# Patient Record
Sex: Female | Born: 1990 | Race: White | Hispanic: No | Marital: Single | Smoking: Never smoker
Health system: Southern US, Community
[De-identification: ages and names within clinical notes are randomized; demographics above are authoritative.]

## PROBLEM LIST (undated history)

## (undated) ENCOUNTER — Inpatient Hospital Stay (HOSPITAL_COMMUNITY): Payer: Self-pay

## (undated) DIAGNOSIS — F32A Depression, unspecified: Secondary | ICD-10-CM

## (undated) DIAGNOSIS — R87629 Unspecified abnormal cytological findings in specimens from vagina: Secondary | ICD-10-CM

## (undated) DIAGNOSIS — K5909 Other constipation: Secondary | ICD-10-CM

## (undated) DIAGNOSIS — M797 Fibromyalgia: Secondary | ICD-10-CM

## (undated) DIAGNOSIS — F1911 Other psychoactive substance abuse, in remission: Secondary | ICD-10-CM

## (undated) DIAGNOSIS — Z8619 Personal history of other infectious and parasitic diseases: Secondary | ICD-10-CM

## (undated) DIAGNOSIS — F319 Bipolar disorder, unspecified: Secondary | ICD-10-CM

## (undated) DIAGNOSIS — F419 Anxiety disorder, unspecified: Secondary | ICD-10-CM

## (undated) DIAGNOSIS — F329 Major depressive disorder, single episode, unspecified: Secondary | ICD-10-CM

## (undated) HISTORY — PX: NO PAST SURGERIES: SHX2092

## (undated) HISTORY — PX: TONSILLECTOMY: SUR1361

## (undated) HISTORY — PX: BREAST ENHANCEMENT SURGERY: SHX7

## (undated) HISTORY — DX: Unspecified abnormal cytological findings in specimens from vagina: R87.629

---
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
## 2010-06-08 ENCOUNTER — Ambulatory Visit (HOSPITAL_COMMUNITY)
Admission: RE | Admit: 2010-06-08 | Discharge: 2010-06-08 | Disposition: A | Discharge status: Home / Self Care | Payer: BC Managed Care – PPO | Source: Ambulatory Visit | Attending: Psychiatry | Admitting: Psychiatry

## 2010-06-08 ENCOUNTER — Other Ambulatory Visit (HOSPITAL_COMMUNITY): Payer: BC Managed Care – PPO | Admitting: Psychiatry

## 2010-06-08 DIAGNOSIS — F3289 Other specified depressive episodes: Secondary | ICD-10-CM | POA: Insufficient documentation

## 2010-06-08 DIAGNOSIS — F329 Major depressive disorder, single episode, unspecified: Secondary | ICD-10-CM | POA: Insufficient documentation

## 2010-06-11 ENCOUNTER — Other Ambulatory Visit (HOSPITAL_COMMUNITY): Payer: Self-pay

## 2010-06-12 ENCOUNTER — Other Ambulatory Visit (HOSPITAL_COMMUNITY): Payer: BC Managed Care – PPO | Attending: Psychiatry | Admitting: Psychiatry

## 2010-06-12 DIAGNOSIS — J45909 Unspecified asthma, uncomplicated: Secondary | ICD-10-CM | POA: Insufficient documentation

## 2010-06-12 DIAGNOSIS — F329 Major depressive disorder, single episode, unspecified: Secondary | ICD-10-CM | POA: Insufficient documentation

## 2010-06-12 DIAGNOSIS — F3289 Other specified depressive episodes: Secondary | ICD-10-CM | POA: Insufficient documentation

## 2010-06-13 ENCOUNTER — Other Ambulatory Visit (HOSPITAL_COMMUNITY): Payer: Self-pay

## 2010-06-13 ENCOUNTER — Other Ambulatory Visit (HOSPITAL_COMMUNITY): Payer: BC Managed Care – PPO | Attending: Psychiatry | Admitting: Psychiatry

## 2010-06-13 ENCOUNTER — Other Ambulatory Visit (HOSPITAL_COMMUNITY): Payer: Self-pay | Admitting: Psychiatry

## 2010-06-14 LAB — DRUGS OF ABUSE SCREEN W/O ALC, ROUTINE URINE
Cocaine Metabolites: NEGATIVE
Creatinine,U: 263.1 mg/dL
Marijuana Metabolite: NEGATIVE
Opiate Screen, Urine: NEGATIVE
Propoxyphene: NEGATIVE

## 2010-06-15 ENCOUNTER — Other Ambulatory Visit (HOSPITAL_COMMUNITY): Payer: BC Managed Care – PPO | Attending: Psychiatry | Admitting: Psychiatry

## 2010-06-15 ENCOUNTER — Other Ambulatory Visit (HOSPITAL_COMMUNITY): Payer: Self-pay

## 2010-06-15 DIAGNOSIS — F411 Generalized anxiety disorder: Secondary | ICD-10-CM | POA: Insufficient documentation

## 2010-06-15 DIAGNOSIS — J45909 Unspecified asthma, uncomplicated: Secondary | ICD-10-CM | POA: Insufficient documentation

## 2010-06-15 DIAGNOSIS — F332 Major depressive disorder, recurrent severe without psychotic features: Secondary | ICD-10-CM | POA: Insufficient documentation

## 2010-06-15 DIAGNOSIS — F1910 Other psychoactive substance abuse, uncomplicated: Secondary | ICD-10-CM | POA: Insufficient documentation

## 2010-06-15 DIAGNOSIS — F1994 Other psychoactive substance use, unspecified with psychoactive substance-induced mood disorder: Secondary | ICD-10-CM | POA: Insufficient documentation

## 2010-06-15 DIAGNOSIS — F1920 Other psychoactive substance dependence, uncomplicated: Secondary | ICD-10-CM

## 2010-06-18 ENCOUNTER — Ambulatory Visit (HOSPITAL_COMMUNITY)
Admission: RE | Admit: 2010-06-18 | Discharge: 2010-06-18 | Disposition: A | Discharge status: Home / Self Care | Payer: BC Managed Care – PPO | Attending: Psychiatry | Admitting: Psychiatry

## 2010-06-18 ENCOUNTER — Other Ambulatory Visit (HOSPITAL_COMMUNITY): Payer: Self-pay

## 2010-06-18 ENCOUNTER — Other Ambulatory Visit (HOSPITAL_COMMUNITY): Payer: BC Managed Care – PPO | Admitting: Psychiatry

## 2010-06-18 ENCOUNTER — Emergency Department (HOSPITAL_COMMUNITY)
Admission: EM | Admit: 2010-06-18 | Discharge: 2010-06-19 | Disposition: A | Discharge status: Other Acute Inpatient Hospital | Payer: BC Managed Care – PPO | Attending: Emergency Medicine | Admitting: Emergency Medicine

## 2010-06-18 DIAGNOSIS — F329 Major depressive disorder, single episode, unspecified: Secondary | ICD-10-CM | POA: Insufficient documentation

## 2010-06-18 DIAGNOSIS — F3289 Other specified depressive episodes: Secondary | ICD-10-CM | POA: Insufficient documentation

## 2010-06-18 DIAGNOSIS — R45851 Suicidal ideations: Secondary | ICD-10-CM | POA: Insufficient documentation

## 2010-06-18 LAB — DIFFERENTIAL
Basophils Absolute: 0.1 10*3/uL (ref 0.0–0.1)
Basophils Relative: 1 % (ref 0–1)
Lymphocytes Relative: 26 % (ref 12–46)
Monocytes Absolute: 0.6 10*3/uL (ref 0.1–1.0)
Monocytes Relative: 7 % (ref 3–12)
Neutro Abs: 5.9 10*3/uL (ref 1.7–7.7)
Neutrophils Relative %: 63 % (ref 43–77)

## 2010-06-18 LAB — URINALYSIS, ROUTINE W REFLEX MICROSCOPIC
Glucose, UA: NEGATIVE mg/dL
Ketones, ur: NEGATIVE mg/dL
Protein, ur: NEGATIVE mg/dL
Urobilinogen, UA: 0.2 mg/dL (ref 0.0–1.0)

## 2010-06-18 LAB — CBC
HCT: 38.4 % (ref 36.0–46.0)
Hemoglobin: 13.1 g/dL (ref 12.0–15.0)
RBC: 4.35 MIL/uL (ref 3.87–5.11)

## 2010-06-18 LAB — ETHANOL: Alcohol, Ethyl (B): <11 mg/dL — ABNORMAL HIGH (ref 0–10)

## 2010-06-18 LAB — RAPID URINE DRUG SCREEN, HOSP PERFORMED
Amphetamines: NOT DETECTED
Barbiturates: NOT DETECTED
Benzodiazepines: NOT DETECTED

## 2010-06-18 LAB — COMPREHENSIVE METABOLIC PANEL
Alkaline Phosphatase: 64 U/L (ref 39–117)
BUN: 14 mg/dL (ref 6–23)
Calcium: 9.7 mg/dL (ref 8.4–10.5)
Creatinine, Ser: 0.75 mg/dL (ref 0.4–1.2)
Glucose, Bld: 78 mg/dL (ref 70–99)
Total Protein: 8.3 g/dL (ref 6.0–8.3)

## 2010-06-19 ENCOUNTER — Inpatient Hospital Stay (HOSPITAL_COMMUNITY)
Admission: AD | Admit: 2010-06-19 | Discharge: 2010-06-22 | DRG: 430 | Disposition: A | Discharge status: Home / Self Care | Payer: BC Managed Care – PPO | Source: Ambulatory Visit | Attending: Psychiatry | Admitting: Psychiatry

## 2010-06-19 DIAGNOSIS — R45851 Suicidal ideations: Secondary | ICD-10-CM

## 2010-06-19 DIAGNOSIS — J45909 Unspecified asthma, uncomplicated: Secondary | ICD-10-CM

## 2010-06-19 DIAGNOSIS — Z560 Unemployment, unspecified: Secondary | ICD-10-CM

## 2010-06-19 DIAGNOSIS — F1911 Other psychoactive substance abuse, in remission: Secondary | ICD-10-CM

## 2010-06-19 DIAGNOSIS — F332 Major depressive disorder, recurrent severe without psychotic features: Principal | ICD-10-CM

## 2010-06-19 DIAGNOSIS — IMO0002 Reserved for concepts with insufficient information to code with codable children: Secondary | ICD-10-CM

## 2010-06-19 DIAGNOSIS — Z880 Allergy status to penicillin: Secondary | ICD-10-CM

## 2010-06-20 ENCOUNTER — Other Ambulatory Visit (HOSPITAL_COMMUNITY): Payer: Self-pay

## 2010-06-20 ENCOUNTER — Other Ambulatory Visit (HOSPITAL_COMMUNITY): Payer: BC Managed Care – PPO | Admitting: Psychiatry

## 2010-06-20 DIAGNOSIS — F339 Major depressive disorder, recurrent, unspecified: Secondary | ICD-10-CM

## 2010-06-20 DIAGNOSIS — F1921 Other psychoactive substance dependence, in remission: Secondary | ICD-10-CM
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
## 2010-06-22 ENCOUNTER — Other Ambulatory Visit (HOSPITAL_COMMUNITY): Payer: BC Managed Care – PPO | Admitting: Psychiatry

## 2010-06-22 ENCOUNTER — Other Ambulatory Visit (HOSPITAL_COMMUNITY): Payer: Self-pay

## 2010-06-25 ENCOUNTER — Other Ambulatory Visit (HOSPITAL_COMMUNITY): Payer: Self-pay

## 2010-06-25 ENCOUNTER — Other Ambulatory Visit (HOSPITAL_COMMUNITY): Payer: BC Managed Care – PPO | Admitting: Psychiatry

## 2010-06-25 NOTE — Discharge Summary (Signed)
Alejandra George, [name]:  [id number]  MEDICAL RECORD NO.:  [id number]  LOCATION:  0507                          FACILITY:  BH  PHYSICIAN:  Franchot Gallo, MD     DATE OF BIRTH:  October 25, 1990  DATE OF ADMISSION:  06/19/2010 DATE OF DISCHARGE:  06/22/2010                              DISCHARGE SUMMARY   REASON FOR ADMISSION:  The patient states that she does not know who she is or why she is alive.  She reported having longstanding depressant symptoms as well as a history of polysubstance dependence abuse.  She reports a long history of depression since the age of 82.  Having problems initiating and maintaining sleep.  Severe feelings of sadness, anhedonia, depressed mood and crying spells.  She was having some vague suicidal thoughts, but no plan or intent.  FINAL IMPRESSION:  AXIS I:  Major depressive disorder recurrent, severe. Dysthymic disorder, early onset type.  Polysubstance abuse dependence in early remission for 30 days. AXIS II:  Deferred. AXIS III:  History of asthma, seasonal allergies. AXIS IV:  Problems with limited primary support system, unemployment, financial constraints and chronic mental illness. AXIS V:  Highest global assessment of functioning is 55-60.  PERTINENT LABS:  Alcohol level was less than 11.  Urine pregnancy test was negative.  Urinalysis was negative.  Urine drug screen is negative. CBC is within normal limits.  Potassium of 3.2.  The patient did receive 40 mEq of potassium chloride.  SIGNIFICANT FINDINGS:  The patient was admitted to the adult milieu. She was restarted on her Zoloft 50 mg to address her depressive symptoms and Lamictal to provide mood stabilization.  Also had a multivitamin due to a slight reduction in serum potassium and an albumin level of 3.9. We also monitored her for dangerousness to self or others and to identify her support group.  The patient was attending groups.  Her sleep was improving.  Her  appetite was good.  Having mild depressive symptoms rating it as 2 on a scale of 1-10.  Denied any suicidal or homicidal thoughts, or auditory hallucinations.  Having no medication side effects.  The patient was integrated on the unit well, making friends and initially did not want to be discharged.  She was scheduled to go to the [employer] on June 26, 2010 regarding permanent housing.  On day of discharge, the patient's sleep was good, appetite was good, having mild depressive symptoms rating 1 on a scale of 1-10.  Adamantly denying any suicidal or homicidal thoughts or auditory hallucinations.  No substance use in 30 days.  Did not need any medication followup.  The patient will be discharged to outpatient care to follow up with [facility] on Tuesday.  DISCHARGE MEDICATIONS: 1. Vistaril 1 tablet nightly. 2. Lamictal 25 mg daily. 3. Zoloft 50 mg daily. 4. Proventil for shortness of breath.  FOLLOW UP:  Her follow up appointment was at [facility] and [facility] with an  appointment on Thursday, June 28, 2010 at 8 a.m.     Alejandra George, N.P.   ______________________________ Franchot Gallo, MD    JO/MEDQ  D:  06/22/2010  T:  06/22/2010  Job:  [id number]  Electronically Signed by Alejandra George.P. on 06/25/2010 09:22:18 AM Electronically Signed by Franchot Gallo MD on 06/25/2010 05:05:30 PM

## 2010-06-27 ENCOUNTER — Other Ambulatory Visit (HOSPITAL_COMMUNITY): Payer: Self-pay

## 2010-06-27 ENCOUNTER — Other Ambulatory Visit (HOSPITAL_COMMUNITY): Payer: BC Managed Care – PPO | Admitting: Psychiatry

## 2010-06-29 ENCOUNTER — Other Ambulatory Visit (HOSPITAL_COMMUNITY): Payer: Self-pay

## 2010-06-29 ENCOUNTER — Other Ambulatory Visit (HOSPITAL_COMMUNITY): Payer: BC Managed Care – PPO | Admitting: Psychiatry

## 2010-07-02 ENCOUNTER — Other Ambulatory Visit (HOSPITAL_COMMUNITY): Payer: BC Managed Care – PPO | Admitting: Psychiatry

## 2010-07-02 ENCOUNTER — Other Ambulatory Visit (HOSPITAL_COMMUNITY): Payer: Self-pay

## 2010-07-04 ENCOUNTER — Other Ambulatory Visit (HOSPITAL_COMMUNITY): Payer: BC Managed Care – PPO | Admitting: Psychiatry

## 2010-07-04 ENCOUNTER — Other Ambulatory Visit (HOSPITAL_COMMUNITY): Payer: Self-pay

## 2010-07-06 ENCOUNTER — Other Ambulatory Visit (HOSPITAL_COMMUNITY): Payer: Self-pay

## 2010-07-06 ENCOUNTER — Other Ambulatory Visit (HOSPITAL_COMMUNITY): Payer: BC Managed Care – PPO | Admitting: Psychiatry

## 2010-07-09 ENCOUNTER — Other Ambulatory Visit (HOSPITAL_COMMUNITY): Payer: BC Managed Care – PPO | Admitting: Psychiatry

## 2010-07-09 ENCOUNTER — Other Ambulatory Visit (HOSPITAL_COMMUNITY): Payer: Self-pay

## 2010-07-11 ENCOUNTER — Other Ambulatory Visit (HOSPITAL_COMMUNITY): Payer: Self-pay

## 2010-07-11 ENCOUNTER — Other Ambulatory Visit (HOSPITAL_COMMUNITY): Payer: BC Managed Care – PPO | Admitting: Psychiatry

## 2010-07-13 ENCOUNTER — Other Ambulatory Visit (HOSPITAL_COMMUNITY): Payer: Self-pay

## 2010-07-13 ENCOUNTER — Other Ambulatory Visit (HOSPITAL_COMMUNITY): Payer: BC Managed Care – PPO | Admitting: Psychiatry

## 2010-07-16 ENCOUNTER — Other Ambulatory Visit (HOSPITAL_COMMUNITY): Payer: Self-pay

## 2010-07-16 ENCOUNTER — Other Ambulatory Visit (HOSPITAL_COMMUNITY): Payer: BC Managed Care – PPO | Admitting: Psychiatry

## 2010-07-20 ENCOUNTER — Other Ambulatory Visit (HOSPITAL_COMMUNITY): Payer: Self-pay

## 2010-07-20 ENCOUNTER — Other Ambulatory Visit (HOSPITAL_COMMUNITY): Payer: BC Managed Care – PPO | Admitting: Psychiatry

## 2010-07-23 ENCOUNTER — Other Ambulatory Visit (HOSPITAL_COMMUNITY): Payer: BC Managed Care – PPO | Admitting: Psychiatry

## 2010-07-23 ENCOUNTER — Other Ambulatory Visit (HOSPITAL_COMMUNITY): Payer: Self-pay

## 2010-07-25 ENCOUNTER — Other Ambulatory Visit (HOSPITAL_COMMUNITY): Payer: BC Managed Care – PPO | Admitting: Psychiatry

## 2010-07-25 ENCOUNTER — Other Ambulatory Visit (HOSPITAL_COMMUNITY): Payer: Self-pay

## 2010-07-27 ENCOUNTER — Other Ambulatory Visit (HOSPITAL_COMMUNITY): Payer: BC Managed Care – PPO | Admitting: Psychiatry

## 2010-07-27 ENCOUNTER — Other Ambulatory Visit (HOSPITAL_COMMUNITY): Payer: Self-pay

## 2010-07-30 ENCOUNTER — Other Ambulatory Visit (HOSPITAL_COMMUNITY): Payer: BC Managed Care – PPO | Admitting: Psychiatry

## 2010-07-30 ENCOUNTER — Other Ambulatory Visit (HOSPITAL_COMMUNITY): Payer: Self-pay

## 2010-08-01 ENCOUNTER — Other Ambulatory Visit (HOSPITAL_COMMUNITY): Payer: BC Managed Care – PPO | Admitting: Psychiatry

## 2010-08-01 ENCOUNTER — Other Ambulatory Visit (HOSPITAL_COMMUNITY): Payer: Self-pay

## 2010-08-03 ENCOUNTER — Other Ambulatory Visit (HOSPITAL_COMMUNITY): Payer: Self-pay

## 2010-12-03 LAB — OB RESULTS CONSOLE ABO/RH: RH Type: POSITIVE

## 2010-12-03 LAB — OB RESULTS CONSOLE HIV ANTIBODY (ROUTINE TESTING): HIV: NONREACTIVE

## 2010-12-03 LAB — OB RESULTS CONSOLE HEPATITIS B SURFACE ANTIGEN: Hepatitis B Surface Ag: NEGATIVE

## 2010-12-03 LAB — OB RESULTS CONSOLE ANTIBODY SCREEN: Antibody Screen: NEGATIVE

## 2010-12-03 LAB — OB RESULTS CONSOLE RPR: RPR: NONREACTIVE

## 2011-01-11 LAB — OB RESULTS CONSOLE GC/CHLAMYDIA: Gonorrhea: NEGATIVE

## 2011-02-04 ENCOUNTER — Ambulatory Visit (HOSPITAL_COMMUNITY): Payer: Self-pay | Admitting: Psychiatry

## 2011-03-02 ENCOUNTER — Emergency Department (HOSPITAL_COMMUNITY)
Admission: EM | Admit: 2011-03-02 | Discharge: 2011-03-02 | Disposition: A | Discharge status: Home / Self Care | Payer: BC Managed Care – PPO | Attending: Emergency Medicine | Admitting: Emergency Medicine

## 2011-03-02 ENCOUNTER — Encounter (HOSPITAL_COMMUNITY): Payer: Self-pay | Admitting: *Deleted

## 2011-03-02 DIAGNOSIS — Z3400 Encounter for supervision of normal first pregnancy, unspecified trimester: Secondary | ICD-10-CM

## 2011-03-02 LAB — URINALYSIS, ROUTINE W REFLEX MICROSCOPIC
Bilirubin Urine: NEGATIVE
Glucose, UA: NEGATIVE mg/dL
Hgb urine dipstick: NEGATIVE
Ketones, ur: NEGATIVE mg/dL
Leukocytes, UA: NEGATIVE
Nitrite: NEGATIVE
Protein, ur: NEGATIVE mg/dL
Specific Gravity, Urine: 1.016 (ref 1.005–1.030)
Urobilinogen, UA: 0.2 mg/dL (ref 0.0–1.0)
pH: 6 (ref 5.0–8.0)

## 2011-03-02 NOTE — ED Notes (Signed)
Pt states she began having lower abdominal pressure 3 days ago that comes and goes.  It isn't painful but describes it as "uncomfortable".  Talked to her doctor and was told to take tylenol, rest, and to come into the ER if it doesn't go away.  Did not take tylenol.

## 2011-03-02 NOTE — ED Provider Notes (Signed)
History     CSN: [id number]  Arrival date & time 03/02/11  1728   First MD Initiated Contact with Patient 03/02/11 1752      No chief complaint on file.   (Consider location/radiation/quality/duration/timing/severity/associated sxs/prior treatment) HPI Comments: G1P0, [redacted] weeks gestation,  reports she has had 3 days of intermittent lower abdominal pressure.  States the pressure or tightness is intermittent and lasts less than a minute, is not painful.  States she had a "light streak of blood" with some mucus when she wiped 3 days ago, has not had any further bleeding.  She has had 5 episodes of pressure today.  States her urine is darker than normal but otherwise she is having no dysuria, frequency, or urgency.  Denies fever, vomiting.  Obstetrician is Dr Valetta Fuller, [facility].  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  States the pregnancy thus far has been uneventful.  Pt has no medical problems.   The history is provided by the patient.    Past Medical History  Diagnosis Date  . Asthma     History reviewed. No pertinent past surgical history.  History reviewed. No pertinent family history.  History  Substance Use Topics  . Smoking status: Not on file  . Smokeless tobacco: Not on file  . Alcohol Use:     OB History    Grav Para Term Preterm Abortions TAB SAB Ect Mult Living   1               Review of Systems  All other systems reviewed and are negative.    Allergies  Penicillins  Home Medications   Current Outpatient Rx  Name Route Sig Dispense Refill  . PRENATAL 27-0.8 MG PO TABS Oral Take 1 tablet by mouth daily.    . SERTRALINE HCL 25 MG PO TABS Oral Take 25 mg by mouth daily.      BP 131/71  Pulse 82  Temp(Src) 98.8 F (37.1 C) (Oral)  Resp 20  SpO2 100%  Physical Exam  Nursing note and vitals reviewed. Constitutional: She is oriented to person, place, and time. She  appears well-developed and well-nourished. No distress.  HENT:  Head: Normocephalic and atraumatic.  Neck: Neck supple.  Cardiovascular: Normal rate and regular rhythm.   Pulmonary/Chest: Effort normal and breath sounds normal.  Abdominal: Soft. There is no tenderness. There is no rebound and no guarding.       Gravid.   Genitourinary: Vagina normal. Uterus is enlarged. Cervix exhibits no motion tenderness.       Cervix is closed.  Uterus is gravid to level of umbilicus.   Neurological: She is alert and oriented to person, place, and time.    ED Course  Procedures (including critical care time)  Labs Reviewed - No data to display No results found.  Ob nurse is in the room, states fetal HR 140s-150s.  6:30 PM Discussed patient with Dr Manus Gunning.  Plan is for sterile bimanual examination only to check os.  Per our discussion, no need for speculum exam at this time.    7:29 PM Sterile bimanual exam performed.  Cervical os is closed.  Patient is comfortable.  Per OB nurse, patient was monitored for 30 minutes per protocol - patient had no contractions.  Fetal heart tones are normal.    1. Normal pregnancy, first       MDM  G1P0  at [redacted] weeks gestation today dated by ultrasound, [facility] July 6, reports localized pressure and tightening around her lower abdomen over the past 3 days.   The episodes did not register on the monitor as contractions, fetal heart tones are normal, cervical os is closed.  Patient denies any discomfort with these sensations.  Patient d/c home to follow up with obstetrician, recommendations for increased fluid intake, less time on her feet at work.  Pt to go to [facility] for worsening condition or any new concerns.  Patient verbalizes understanding and agrees with plan.          Rise Patience, [place] 03/02/11 2001

## 2011-03-02 NOTE — Progress Notes (Signed)
Ok to use only Doppler for FHT per PA

## 2011-03-02 NOTE — Discharge Instructions (Signed)
Please call your obstetrician for a close follow up appointment.  Drink plenty of fluids over the next few days and stay off your feet as much as possible.  If you continue to have pain or you have any more bleeding, please call your doctor and go to the ER at [facility] (also called the MAU). You may return to the ER at any time for worsening condition or any new symptoms that concern you.    Pregnancy - Second Trimester The second trimester of pregnancy (3 to 6 months) is a period of rapid growth for you and your baby. At the end of the sixth month, your baby is about 9 inches long and weighs 1 1/2 pounds. You will begin to feel the baby move between 18 and 20 weeks of the pregnancy. This is called quickening. Weight gain is faster. A clear fluid (colostrum) may leak out of your breasts. You may feel small contractions of the womb (uterus). This is known as false labor or Braxton-Hicks contractions. This is like a practice for labor when the baby is ready to be born. Usually, the problems with morning sickness have usually passed by the end of your first trimester. Some women develop small dark blotches (called cholasma, mask of pregnancy) on their face that usually goes away after the baby is born. Exposure to the sun makes the blotches worse. Acne may also develop in some pregnant women and pregnant women who have acne, may find that it goes away. PRENATAL EXAMS  Blood work may continue to be done during prenatal exams. These tests are done to check on your health and the probable health of your baby. Blood work is used to follow your blood levels (hemoglobin). Anemia (low hemoglobin) is common during pregnancy. Iron and vitamins are given to help prevent this. You will also be checked for diabetes between 24 and 28 weeks of the pregnancy. Some of the previous blood tests may be repeated.   The size of the uterus is measured during each visit. This is to make sure that the baby is continuing to  grow properly according to the dates of the pregnancy.   Your blood pressure is checked every prenatal visit. This is to make sure you are not getting toxemia.   Your urine is checked to make sure you do not have an infection, diabetes or protein in the urine.   Your weight is checked often to make sure gains are happening at the suggested rate. This is to ensure that both you and your baby are growing normally.   Sometimes, an ultrasound is performed to confirm the proper growth and development of the baby. This is a test which bounces harmless sound waves off the baby so your caregiver can more accurately determine due dates.  Sometimes, a specialized test is done on the amniotic fluid surrounding the baby. This test is called an amniocentesis. The amniotic fluid is obtained by sticking a needle into the belly (abdomen). This is done to check the chromosomes in instances where there is a concern about possible genetic problems with the baby. It is also sometimes done near the end of pregnancy if an early delivery is required. In this case, it is done to help make sure the baby's lungs are mature enough for the baby to live outside of the womb. CHANGES OCCURING IN THE SECOND TRIMESTER OF PREGNANCY Your body goes through many changes during pregnancy. They vary from person to person. Talk to your caregiver about  changes you notice that you are concerned about.  During the second trimester, you will likely have an increase in your appetite. It is normal to have cravings for certain foods. This varies from person to person and pregnancy to pregnancy.   Your lower abdomen will begin to bulge.   You may have to urinate more often because the uterus and baby are pressing on your bladder. It is also common to get more bladder infections during pregnancy (pain with urination). You can help this by drinking lots of fluids and emptying your bladder before and after intercourse.   You may begin to get  stretch marks on your hips, abdomen, and breasts. These are normal changes in the body during pregnancy. There are no exercises or medications to take that prevent this change.   You may begin to develop swollen and bulging veins (varicose veins) in your legs. Wearing support hose, elevating your feet for 15 minutes, 3 to 4 times a day and limiting salt in your diet helps lessen the problem.   Heartburn may develop as the uterus grows and pushes up against the stomach. Antacids recommended by your caregiver helps with this problem. Also, eating smaller meals 4 to 5 times a day helps.   Constipation can be treated with a stool softener or adding bulk to your diet. Drinking lots of fluids, vegetables, fruits, and whole grains are helpful.   Exercising is also helpful. If you have been very active up until your pregnancy, most of these activities can be continued during your pregnancy. If you have been less active, it is helpful to start an exercise program such as walking.   Hemorrhoids (varicose veins in the rectum) may develop at the end of the second trimester. Warm sitz baths and hemorrhoid cream recommended by your caregiver helps hemorrhoid problems.   Backaches may develop during this time of your pregnancy. Avoid heavy lifting, wear low heal shoes and practice good posture to help with backache problems.   Some pregnant women develop tingling and numbness of their hand and fingers because of swelling and tightening of ligaments in the wrist (carpel tunnel syndrome). This goes away after the baby is born.   As your breasts enlarge, you may have to get a bigger bra. Get a comfortable, cotton, support bra. Do not get a nursing bra until the last month of the pregnancy if you will be nursing the baby.   You may get a dark line from your belly button to the pubic area called the linea nigra.   You may develop rosy cheeks because of increase blood flow to the face.   You may develop spider  looking lines of the face, neck, arms and chest. These go away after the baby is born.  HOME CARE INSTRUCTIONS   It is extremely important to avoid all smoking, herbs, alcohol, and unprescribed drugs during your pregnancy. These chemicals affect the formation and growth of the baby. Avoid these chemicals throughout the pregnancy to ensure the delivery of a healthy infant.   Most of your home care instructions are the same as suggested for the first trimester of your pregnancy. Keep your caregiver's appointments. Follow your caregiver's instructions regarding medication use, exercise and diet.   During pregnancy, you are providing food for you and your baby. Continue to eat regular, well-balanced meals. Choose foods such as meat, fish, milk and other low fat dairy products, vegetables, fruits, and whole-grain breads and cereals. Your caregiver will tell you of the  ideal weight gain.   A physical sexual relationship may be continued up until near the end of pregnancy if there are no other problems. Problems could include early (premature) leaking of amniotic fluid from the membranes, vaginal bleeding, abdominal pain, or other medical or pregnancy problems.   Exercise regularly if there are no restrictions. Check with your caregiver if you are unsure of the safety of some of your exercises. The greatest weight gain will occur in the last 2 trimesters of pregnancy. Exercise will help you:   Control your weight.   Get you in shape for labor and delivery.   Lose weight after you have the baby.   Wear a good support or jogging bra for breast tenderness during pregnancy. This may help if worn during sleep. Pads or tissues may be used in the bra if you are leaking colostrum.   Do not use hot tubs, steam rooms or saunas throughout the pregnancy.   Wear your seat belt at all times when driving. This protects you and your baby if you are in an accident.   Avoid raw meat, uncooked cheese, cat litter  boxes and soil used by cats. These carry germs that can cause birth defects in the baby.   The second trimester is also a good time to visit your dentist for your dental health if this has not been done yet. Getting your teeth cleaned is OK. Use a soft toothbrush. Brush gently during pregnancy.   It is easier to loose urine during pregnancy. Tightening up and strengthening the pelvic muscles will help with this problem. Practice stopping your urination while you are going to the bathroom. These are the same muscles you need to strengthen. It is also the muscles you would use as if you were trying to stop from passing gas. You can practice tightening these muscles up 10 times a set and repeating this about 3 times per day. Once you know what muscles to tighten up, do not perform these exercises during urination. It is more likely to contribute to an infection by backing up the urine.   Ask for help if you have financial, counseling or nutritional needs during pregnancy. Your caregiver will be able to offer counseling for these needs as well as refer you for other special needs.   Your skin may become oily. If so, wash your face with mild soap, use non-greasy moisturizer and oil or cream based makeup.  MEDICATIONS AND DRUG USE IN PREGNANCY  Take prenatal vitamins as directed. The vitamin should contain 1 milligram of folic acid. Keep all vitamins out of reach of children. Only a couple vitamins or tablets containing iron may be fatal to a baby or young child when ingested.   Avoid use of all medications, including herbs, over-the-counter medications, not prescribed or suggested by your caregiver. Only take over-the-counter or prescription medicines for pain, discomfort, or fever as directed by your caregiver. Do not use aspirin.   Let your caregiver also know about herbs you may be using.   Alcohol is related to a number of birth defects. This includes fetal alcohol syndrome. All alcohol, in any form,  should be avoided completely. Smoking will cause low birth rate and premature babies.   Street or illegal drugs are very harmful to the baby. They are absolutely forbidden. A baby born to an addicted mother will be addicted at birth. The baby will go through the same withdrawal an adult does.  SEEK MEDICAL CARE IF:  You have  any concerns or worries during your pregnancy. It is better to call with your questions if you feel they cannot wait, rather than worry about them. SEEK IMMEDIATE MEDICAL CARE IF:   An unexplained oral temperature above 102 F (38.9 C) develops, or as your caregiver suggests.   You have leaking of fluid from the vagina (birth canal). If leaking membranes are suspected, take your temperature and tell your caregiver of this when you call.   There is vaginal spotting, bleeding, or passing clots. Tell your caregiver of the amount and how many pads are used. Light spotting in pregnancy is common, especially following intercourse.   You develop a bad smelling vaginal discharge with a change in the color from clear to white.   You continue to feel sick to your stomach (nauseated) and have no relief from remedies suggested. You vomit blood or coffee ground-like materials.   You lose more than 2 pounds of weight or gain more than 2 pounds of weight over 1 week, or as suggested by your caregiver.   You notice swelling of your face, hands, feet, or legs.   You get exposed to [place] measles and have never had them.   You are exposed to fifth disease or chickenpox.   You develop belly (abdominal) pain. Round ligament discomfort is a common non-cancerous (benign) cause of abdominal pain in pregnancy. Your caregiver still must evaluate you.   You develop a bad headache that does not go away.   You develop fever, diarrhea, pain with urination, or shortness of breath.   You develop visual problems, blurry, or double vision.   You fall or are in a car accident or any kind of  trauma.   There is mental or physical violence at home.  Document Released: 12/25/2000 Document Revised: 09/12/2010 Document Reviewed: 06/29/2008 [facility] Patient Information 2012 [place], [place].

## 2011-03-02 NOTE — ED Provider Notes (Signed)
Medical screening examination/treatment/procedure(s) were performed by non-physician practitioner and as supervising physician I was immediately available for consultation/collaboration.   Glynn Octave, MD 03/02/11 [phone]

## 2011-03-02 NOTE — ED Notes (Signed)
Baby's HR 145

## 2011-03-02 NOTE — ED Notes (Signed)
Rapid response OB-RN notified

## 2011-03-02 NOTE — ED Notes (Signed)
Pt in c/o intermittent lower abd pain that she describes as pressure, states she noted spotting 2 days ago and today noted a mucus discharge, pt is [redacted] weeks pregnant, G1 P0

## 2011-03-02 NOTE — Progress Notes (Signed)
External monitors taken off per [facility] Fetal monitoring completed.

## 2011-03-04 LAB — URINE CULTURE: Culture  Setup Time: 201302170239

## 2011-06-04 ENCOUNTER — Inpatient Hospital Stay (HOSPITAL_COMMUNITY)
Admission: AD | Admit: 2011-06-04 | Discharge: 2011-06-04 | Disposition: A | Discharge status: Home / Self Care | Payer: BC Managed Care – PPO | Source: Ambulatory Visit | Attending: Obstetrics & Gynecology | Admitting: Obstetrics & Gynecology

## 2011-06-04 ENCOUNTER — Encounter (HOSPITAL_COMMUNITY): Payer: Self-pay | Admitting: *Deleted

## 2011-06-04 DIAGNOSIS — O4700 False labor before 37 completed weeks of gestation, unspecified trimester: Secondary | ICD-10-CM | POA: Insufficient documentation

## 2011-06-04 DIAGNOSIS — O479 False labor, unspecified: Secondary | ICD-10-CM

## 2011-06-04 LAB — URINALYSIS, ROUTINE W REFLEX MICROSCOPIC
Glucose, UA: NEGATIVE mg/dL
Ketones, ur: NEGATIVE mg/dL
Leukocytes, UA: NEGATIVE
pH: 6 (ref 5.0–8.0)

## 2011-06-04 MED ORDER — PROMETHAZINE HCL 25 MG PO TABS
25.0000 mg | ORAL_TABLET | Freq: Once | ORAL | Status: AC
  Administered 2011-06-04: 25 mg via ORAL
  Filled 2011-06-04: qty 1

## 2011-06-04 MED ORDER — PROMETHAZINE HCL 25 MG PO TABS: 25.0000 mg | ORAL_TABLET | Freq: Once | ORAL | Status: DC

## 2011-06-04 NOTE — Discharge Instructions (Signed)

## 2011-06-04 NOTE — MAU Provider Note (Signed)
  History     CSN: [id number]  Arrival date and time: 06/04/11 2125   None     No chief complaint on file.  HPI This is a 21 y.o. female at [redacted]w[redacted]d who presents with c/o contractions all day. Had nausea and vomiting today too, so could not push fluids. No leaking or bleeding.    OB History    Grav Para Term Preterm Abortions TAB SAB Ect Mult Living   1               Past Medical History  Diagnosis Date  . Asthma     Past Surgical History  Procedure Date  . No past surgeries     History reviewed. No pertinent family history.  History  Substance Use Topics  . Smoking status: Never Smoker   . Smokeless tobacco: Not on file  . Alcohol Use: No    Allergies:  Allergies  Allergen Reactions  . Penicillins Swelling    Prescriptions prior to admission  Medication Sig Dispense Refill  . albuterol (PROVENTIL HFA;VENTOLIN HFA) 108 (90 BASE) MCG/ACT inhaler Inhale 2 puffs into the lungs daily as needed. For asthma      . loratadine (CLARITIN) 10 MG tablet Take 10 mg by mouth once.      . Prenatal Vit-Fe Fumarate-FA (MULTIVITAMIN-PRENATAL) 27-0.8 MG TABS Take 1 tablet by mouth daily.      . pseudoephedrine (SUDAFED) 30 MG tablet Take 30 mg by mouth once.      . sertraline (ZOLOFT) 25 MG tablet Take 25 mg by mouth daily.        ROS As listed in HPI  Physical Exam   Blood pressure 115/66, pulse 96, temperature 97 F (36.1 C), temperature source Oral, resp. rate 20, height 5\' 5"  (1.651 m), weight 168 lb 6 oz (76.374 kg).  Physical Exam  Constitutional: She is oriented to person, place, and time. She appears well-developed and well-nourished. No distress (appears comfortable with contractions).  Cardiovascular: Normal rate.   Respiratory: Effort normal.  GI: Soft. She exhibits no distension and no mass. There is no tenderness. There is no rebound and no guarding.  Genitourinary: Vagina normal and uterus normal. No vaginal discharge found.       Cervix closed/40%/-3/vtx    Musculoskeletal: Normal range of motion.  Neurological: She is alert and oriented to person, place, and time.  Skin: Skin is warm and dry.  Psychiatric: She has a normal mood and affect.   FHR reactive UCs irregular and mild  MAU Course  Procedures  MDM Discussed with Dr Arlyce Dice, WIll d/c home.  Assessment and Plan  A:  SIUP at [redacted]w[redacted]d      Braxton Hicks contractions, probably related to poor fluid intake due to nausea/vomiting P:  Discharge       Rx Phenergan. Will give one dose here      Has appt tomorrow and will get rechecked then   [facility] 06/04/2011, 11:10 PM

## 2011-06-04 NOTE — MAU Note (Signed)
PT SAY SHE AWOKE THIS AM -  THEN LAYED BACK DOWN AND SHE STARTED HAVING  PAINS  IN LOWER ABD -  SO SHE WENT TO [facility] TO WALK AROUND -  30 MIN.  THEN  HOME - DRANK WATER - LAYED DOWN.   CALLED OFFICE AT  3PM-   THEY SAID PROB BRAXTON HICKS-   GO TO HOSPITAL IF NEED TO.  SO LAYED DOWN  TILL 545-  ABD TIGHT -  THEN UC   COMING MORE OFTEN- SHE DROVE TO A PARTY  FOR 30 MIN.- FELT NAUSEATED.    CALLED DR-   COME TO HOSPITAL IF NEED TO.   SHE DRANK  1 BOTTLE  OF WATER- THEN VOMITED.   DOES NOT FEEL NAUSEA NOW.   FELT TIGHTENING IN LOBBY.  BACK HURTS - STARTED AT NOON.

## 2011-06-19 LAB — OB RESULTS CONSOLE GBS: GBS: NEGATIVE

## 2011-07-20 ENCOUNTER — Inpatient Hospital Stay (HOSPITAL_COMMUNITY)
Admission: AD | Admit: 2011-07-20 | Discharge: 2011-07-20 | Disposition: A | Discharge status: Home / Self Care | Payer: BC Managed Care – PPO | Source: Ambulatory Visit | Attending: Obstetrics and Gynecology | Admitting: Obstetrics and Gynecology

## 2011-07-20 ENCOUNTER — Encounter (HOSPITAL_COMMUNITY): Payer: Self-pay | Admitting: General Practice

## 2011-07-20 DIAGNOSIS — O99891 Other specified diseases and conditions complicating pregnancy: Secondary | ICD-10-CM | POA: Insufficient documentation

## 2011-07-20 HISTORY — DX: Major depressive disorder, single episode, unspecified: F32.9

## 2011-07-20 HISTORY — DX: Depression, unspecified: F32.A

## 2011-07-20 LAB — POCT FERN TEST: Fern Test: NEGATIVE

## 2011-07-20 NOTE — MAU Provider Note (Signed)
Asked by nursing to do speculum exam to rule out rupture.  No pooling seen on exam.  No fluid from cervix with valsalva.  Ferning negative.  Levie Heritage, DO 07/20/2011 1:15 AM

## 2011-07-20 NOTE — MAU Note (Signed)
Have had a lot of mucousy d/c for the last 3 days. Tonight leaked a lot of fld and cont to stay wet down there. Did not wear a pad in to hosp.

## 2011-07-23 ENCOUNTER — Inpatient Hospital Stay (HOSPITAL_COMMUNITY)
Admission: AD | Admit: 2011-07-23 | Discharge: 2011-07-28 | DRG: 370 | Disposition: A | Discharge status: Home / Self Care | Payer: BC Managed Care – PPO | Source: Ambulatory Visit | Attending: Obstetrics and Gynecology | Admitting: Obstetrics and Gynecology

## 2011-07-23 DIAGNOSIS — D649 Anemia, unspecified: Secondary | ICD-10-CM | POA: Diagnosis not present

## 2011-07-23 DIAGNOSIS — O9903 Anemia complicating the puerperium: Secondary | ICD-10-CM | POA: Diagnosis not present

## 2011-07-23 HISTORY — DX: Other psychoactive substance abuse, in remission: F19.11

## 2011-07-23 HISTORY — DX: Anxiety disorder, unspecified: F41.9

## 2011-07-23 HISTORY — DX: Personal history of other infectious and parasitic diseases: Z86.19

## 2011-07-23 NOTE — MAU Note (Signed)
Patient is in for labor eval. She states that she has ctx q2-84mins since 2210pm. She denies any vaginal bleeding or lof. Reports good fetal movement

## 2011-07-24 ENCOUNTER — Encounter (HOSPITAL_COMMUNITY): Payer: Self-pay | Admitting: Anesthesiology

## 2011-07-24 ENCOUNTER — Encounter (HOSPITAL_COMMUNITY): Payer: Self-pay

## 2011-07-24 ENCOUNTER — Inpatient Hospital Stay (HOSPITAL_COMMUNITY): Payer: BC Managed Care – PPO | Admitting: Anesthesiology

## 2011-07-24 ENCOUNTER — Encounter (HOSPITAL_COMMUNITY)
Admission: AD | Disposition: A | Discharge status: Home / Self Care | Payer: Self-pay | Source: Ambulatory Visit | Attending: Obstetrics and Gynecology

## 2011-07-24 ENCOUNTER — Encounter (HOSPITAL_COMMUNITY): Payer: Self-pay | Admitting: *Deleted

## 2011-07-24 LAB — CBC
Hemoglobin: 11.5 g/dL — ABNORMAL LOW (ref 12.0–15.0)
MCH: 30.3 pg (ref 26.0–34.0)
MCHC: 33.4 g/dL (ref 30.0–36.0)
RDW: 13.5 % (ref 11.5–15.5)

## 2011-07-24 LAB — RPR: RPR Ser Ql: NONREACTIVE

## 2011-07-24 SURGERY — Surgical Case
Anesthesia: Regional | Site: Abdomen | Wound class: Clean Contaminated

## 2011-07-24 MED ORDER — PHENYLEPHRINE 40 MCG/ML (10ML) SYRINGE FOR IV PUSH (FOR BLOOD PRESSURE SUPPORT)
80.0000 ug | PREFILLED_SYRINGE | INTRAVENOUS | Status: DC | PRN
  Filled 2011-07-24: qty 5

## 2011-07-24 MED ORDER — DIPHENHYDRAMINE HCL 25 MG PO CAPS: 25.0000 mg | ORAL_CAPSULE | ORAL | Status: DC | PRN

## 2011-07-24 MED ORDER — KETOROLAC TROMETHAMINE 30 MG/ML IJ SOLN: 30.0000 mg | Freq: Four times a day (QID) | INTRAMUSCULAR | Status: DC | PRN

## 2011-07-24 MED ORDER — FLEET ENEMA 7-19 GM/118ML RE ENEM: 1.0000 | ENEMA | RECTAL | Status: DC | PRN

## 2011-07-24 MED ORDER — PHENYLEPHRINE 40 MCG/ML (10ML) SYRINGE FOR IV PUSH (FOR BLOOD PRESSURE SUPPORT): 80.0000 ug | PREFILLED_SYRINGE | INTRAVENOUS | Status: DC | PRN

## 2011-07-24 MED ORDER — FENTANYL CITRATE 0.05 MG/ML IJ SOLN
INTRAMUSCULAR | Status: AC
  Filled 2011-07-24: qty 4

## 2011-07-24 MED ORDER — SODIUM BICARBONATE 8.4 % IV SOLN
INTRAVENOUS | Status: AC
  Filled 2011-07-24: qty 50

## 2011-07-24 MED ORDER — LACTATED RINGERS IV SOLN
INTRAVENOUS | Status: DC
  Administered 2011-07-25: via INTRAVENOUS

## 2011-07-24 MED ORDER — ONDANSETRON HCL 4 MG PO TABS: 4.0000 mg | ORAL_TABLET | ORAL | Status: DC | PRN

## 2011-07-24 MED ORDER — PHENYLEPHRINE 40 MCG/ML (10ML) SYRINGE FOR IV PUSH (FOR BLOOD PRESSURE SUPPORT)
PREFILLED_SYRINGE | INTRAVENOUS | Status: AC
  Filled 2011-07-24: qty 10

## 2011-07-24 MED ORDER — SODIUM CHLORIDE 0.9 % IV SOLN: 1.0000 ug/kg/h | INTRAVENOUS | Status: DC | PRN

## 2011-07-24 MED ORDER — NALOXONE HCL 0.4 MG/ML IJ SOLN: 0.4000 mg | INTRAMUSCULAR | Status: DC | PRN

## 2011-07-24 MED ORDER — PHENYLEPHRINE HCL 10 MG/ML IJ SOLN
INTRAMUSCULAR | Status: DC | PRN
  Administered 2011-07-24: 40 ug via INTRAVENOUS
  Administered 2011-07-24 (×4): 80 ug via INTRAVENOUS

## 2011-07-24 MED ORDER — IBUPROFEN 600 MG PO TABS: 600.0000 mg | ORAL_TABLET | Freq: Four times a day (QID) | ORAL | Status: DC | PRN

## 2011-07-24 MED ORDER — DIBUCAINE 1 % RE OINT: 1.0000 "application " | TOPICAL_OINTMENT | RECTAL | Status: DC | PRN

## 2011-07-24 MED ORDER — LACTATED RINGERS IV SOLN
500.0000 mL | Freq: Once | INTRAVENOUS | Status: AC
  Administered 2011-07-24: 300 mL via INTRAVENOUS

## 2011-07-24 MED ORDER — LANOLIN HYDROUS EX OINT: 1.0000 "application " | TOPICAL_OINTMENT | CUTANEOUS | Status: DC | PRN

## 2011-07-24 MED ORDER — OXYCODONE-ACETAMINOPHEN 5-325 MG PO TABS
1.0000 | ORAL_TABLET | ORAL | Status: DC | PRN
  Administered 2011-07-25: 2 via ORAL
  Administered 2011-07-25 (×2): 1 via ORAL
  Administered 2011-07-26 (×4): 2 via ORAL
  Administered 2011-07-27: 1 via ORAL
  Administered 2011-07-27: 2 via ORAL
  Administered 2011-07-27 – 2011-07-28 (×4): 1 via ORAL
  Filled 2011-07-24 (×2): qty 1
  Filled 2011-07-24 (×4): qty 2
  Filled 2011-07-24 (×5): qty 1
  Filled 2011-07-24 (×2): qty 2

## 2011-07-24 MED ORDER — DIPHENHYDRAMINE HCL 50 MG/ML IJ SOLN: 12.5000 mg | INTRAMUSCULAR | Status: DC | PRN

## 2011-07-24 MED ORDER — LIDOCAINE HCL (PF) 1 % IJ SOLN
30.0000 mL | INTRAMUSCULAR | Status: DC | PRN
  Filled 2011-07-24: qty 30

## 2011-07-24 MED ORDER — OXYCODONE-ACETAMINOPHEN 5-325 MG PO TABS: 1.0000 | ORAL_TABLET | ORAL | Status: DC | PRN

## 2011-07-24 MED ORDER — MORPHINE SULFATE 0.5 MG/ML IJ SOLN
INTRAMUSCULAR | Status: AC
  Filled 2011-07-24: qty 10

## 2011-07-24 MED ORDER — KETOROLAC TROMETHAMINE 30 MG/ML IJ SOLN
30.0000 mg | Freq: Four times a day (QID) | INTRAMUSCULAR | Status: DC | PRN
  Administered 2011-07-24: 30 mg via INTRAVENOUS

## 2011-07-24 MED ORDER — DIPHENHYDRAMINE HCL 25 MG PO CAPS: 25.0000 mg | ORAL_CAPSULE | Freq: Four times a day (QID) | ORAL | Status: DC | PRN

## 2011-07-24 MED ORDER — ONDANSETRON HCL 4 MG/2ML IJ SOLN
4.0000 mg | INTRAMUSCULAR | Status: DC | PRN
  Administered 2011-07-24: 4 mg via INTRAVENOUS
  Filled 2011-07-24: qty 2

## 2011-07-24 MED ORDER — LACTATED RINGERS IV SOLN
INTRAVENOUS | Status: DC
  Administered 2011-07-24 (×3): via INTRAVENOUS

## 2011-07-24 MED ORDER — LACTATED RINGERS IV SOLN
INTRAVENOUS | Status: DC | PRN
  Administered 2011-07-24: 17:00:00 via INTRAVENOUS

## 2011-07-24 MED ORDER — TERBUTALINE SULFATE 1 MG/ML IJ SOLN: 0.2500 mg | Freq: Once | INTRAMUSCULAR | Status: DC | PRN

## 2011-07-24 MED ORDER — OXYTOCIN 40 UNITS IN LACTATED RINGERS INFUSION - SIMPLE MED: 62.5000 mL/h | Freq: Once | INTRAVENOUS | Status: DC

## 2011-07-24 MED ORDER — SIMETHICONE 80 MG PO CHEW
80.0000 mg | CHEWABLE_TABLET | ORAL | Status: DC | PRN
  Administered 2011-07-28: 80 mg via ORAL

## 2011-07-24 MED ORDER — MEPERIDINE HCL 25 MG/ML IJ SOLN: 6.2500 mg | INTRAMUSCULAR | Status: DC | PRN

## 2011-07-24 MED ORDER — ZOLPIDEM TARTRATE 5 MG PO TABS: 5.0000 mg | ORAL_TABLET | Freq: Every evening | ORAL | Status: DC | PRN

## 2011-07-24 MED ORDER — IBUPROFEN 600 MG PO TABS
600.0000 mg | ORAL_TABLET | Freq: Four times a day (QID) | ORAL | Status: DC
  Administered 2011-07-25 – 2011-07-28 (×13): 600 mg via ORAL
  Filled 2011-07-24 (×13): qty 1

## 2011-07-24 MED ORDER — ONDANSETRON HCL 4 MG/2ML IJ SOLN
INTRAMUSCULAR | Status: AC
  Filled 2011-07-24: qty 2

## 2011-07-24 MED ORDER — SCOPOLAMINE 1 MG/3DAYS TD PT72: 1.0000 | MEDICATED_PATCH | Freq: Once | TRANSDERMAL | Status: DC

## 2011-07-24 MED ORDER — LACTATED RINGERS IV SOLN
500.0000 mL | INTRAVENOUS | Status: DC | PRN
Start: 2011-07-24 — End: 2011-07-24

## 2011-07-24 MED ORDER — EPHEDRINE 5 MG/ML INJ
10.0000 mg | INTRAVENOUS | Status: DC | PRN
  Filled 2011-07-24: qty 4

## 2011-07-24 MED ORDER — NALBUPHINE HCL 10 MG/ML IJ SOLN: 5.0000 mg | INTRAMUSCULAR | Status: DC | PRN

## 2011-07-24 MED ORDER — KETOROLAC TROMETHAMINE 30 MG/ML IJ SOLN
30.0000 mg | Freq: Four times a day (QID) | INTRAMUSCULAR | Status: AC | PRN
  Administered 2011-07-25 (×2): 30 mg via INTRAVENOUS
  Filled 2011-07-24: qty 1

## 2011-07-24 MED ORDER — METOCLOPRAMIDE HCL 5 MG/ML IJ SOLN: 10.0000 mg | Freq: Three times a day (TID) | INTRAMUSCULAR | Status: DC | PRN

## 2011-07-24 MED ORDER — MORPHINE SULFATE (PF) 0.5 MG/ML IJ SOLN
INTRAMUSCULAR | Status: DC | PRN
  Administered 2011-07-24: 3 ug via EPIDURAL

## 2011-07-24 MED ORDER — FENTANYL 2.5 MCG/ML BUPIVACAINE 1/10 % EPIDURAL INFUSION (WH - ANES)
14.0000 mL/h | INTRAMUSCULAR | Status: DC
  Administered 2011-07-24 (×4): 14 mL/h via EPIDURAL
  Filled 2011-07-24 (×4): qty 60

## 2011-07-24 MED ORDER — BUTORPHANOL TARTRATE 2 MG/ML IJ SOLN
1.0000 mg | INTRAMUSCULAR | Status: DC | PRN
  Administered 2011-07-24: 1 mg via INTRAVENOUS
  Filled 2011-07-24: qty 1

## 2011-07-24 MED ORDER — LIDOCAINE HCL (PF) 1 % IJ SOLN
INTRAMUSCULAR | Status: DC | PRN
  Administered 2011-07-24 (×2): 5 mL

## 2011-07-24 MED ORDER — SENNOSIDES-DOCUSATE SODIUM 8.6-50 MG PO TABS
2.0000 | ORAL_TABLET | Freq: Every day | ORAL | Status: DC
  Administered 2011-07-25 – 2011-07-27 (×3): 2 via ORAL

## 2011-07-24 MED ORDER — MEPERIDINE HCL 25 MG/ML IJ SOLN
INTRAMUSCULAR | Status: AC
  Filled 2011-07-24: qty 1

## 2011-07-24 MED ORDER — SODIUM BICARBONATE 8.4 % IV SOLN
INTRAVENOUS | Status: DC | PRN
  Administered 2011-07-24: 5 mL via EPIDURAL

## 2011-07-24 MED ORDER — MEPERIDINE HCL 25 MG/ML IJ SOLN
INTRAMUSCULAR | Status: DC | PRN
  Administered 2011-07-24: 25 mg via INTRAVENOUS

## 2011-07-24 MED ORDER — FENTANYL CITRATE 0.05 MG/ML IJ SOLN
INTRAMUSCULAR | Status: DC | PRN
  Administered 2011-07-24: 50 ug via INTRAVENOUS

## 2011-07-24 MED ORDER — MENTHOL 3 MG MT LOZG
1.0000 | LOZENGE | OROMUCOSAL | Status: DC | PRN
  Filled 2011-07-24: qty 9

## 2011-07-24 MED ORDER — FENTANYL CITRATE 0.05 MG/ML IJ SOLN: 25.0000 ug | INTRAMUSCULAR | Status: DC | PRN

## 2011-07-24 MED ORDER — OXYTOCIN 40 UNITS IN LACTATED RINGERS INFUSION - SIMPLE MED: 62.5000 mL/h | INTRAVENOUS | Status: AC

## 2011-07-24 MED ORDER — CITRIC ACID-SODIUM CITRATE 334-500 MG/5ML PO SOLN
30.0000 mL | ORAL | Status: DC | PRN
  Administered 2011-07-24: 30 mL via ORAL
  Filled 2011-07-24: qty 15

## 2011-07-24 MED ORDER — EPHEDRINE 5 MG/ML INJ: 10.0000 mg | INTRAVENOUS | Status: DC | PRN

## 2011-07-24 MED ORDER — NALBUPHINE HCL 10 MG/ML IJ SOLN
5.0000 mg | INTRAMUSCULAR | Status: DC | PRN
  Administered 2011-07-25 (×2): 5 mg via INTRAVENOUS
  Filled 2011-07-24: qty 1

## 2011-07-24 MED ORDER — DIPHENHYDRAMINE HCL 50 MG/ML IJ SOLN: 25.0000 mg | INTRAMUSCULAR | Status: DC | PRN

## 2011-07-24 MED ORDER — LIDOCAINE-EPINEPHRINE (PF) 2 %-1:200000 IJ SOLN
INTRAMUSCULAR | Status: AC
  Filled 2011-07-24: qty 20

## 2011-07-24 MED ORDER — ALBUTEROL SULFATE HFA 108 (90 BASE) MCG/ACT IN AERS
INHALATION_SPRAY | RESPIRATORY_TRACT | Status: DC | PRN
  Administered 2011-07-24: 2 via RESPIRATORY_TRACT

## 2011-07-24 MED ORDER — SIMETHICONE 80 MG PO CHEW
80.0000 mg | CHEWABLE_TABLET | Freq: Three times a day (TID) | ORAL | Status: DC
  Administered 2011-07-25 – 2011-07-28 (×13): 80 mg via ORAL

## 2011-07-24 MED ORDER — WITCH HAZEL-GLYCERIN EX PADS: 1.0000 "application " | MEDICATED_PAD | CUTANEOUS | Status: DC | PRN

## 2011-07-24 MED ORDER — OXYTOCIN 10 UNIT/ML IJ SOLN
40.0000 [IU] | INTRAVENOUS | Status: DC | PRN
  Administered 2011-07-24: 40 [IU] via INTRAVENOUS

## 2011-07-24 MED ORDER — ALBUTEROL SULFATE HFA 108 (90 BASE) MCG/ACT IN AERS
INHALATION_SPRAY | RESPIRATORY_TRACT | Status: AC
  Filled 2011-07-24: qty 6.7

## 2011-07-24 MED ORDER — CLINDAMYCIN PHOSPHATE 900 MG/50ML IV SOLN
900.0000 mg | Freq: Once | INTRAVENOUS | Status: AC
Start: 2011-07-24 — End: 2011-07-24
  Administered 2011-07-24: 900 mg via INTRAVENOUS
  Filled 2011-07-24: qty 50

## 2011-07-24 MED ORDER — OXYTOCIN 40 UNITS IN LACTATED RINGERS INFUSION - SIMPLE MED
1.0000 m[IU]/min | INTRAVENOUS | Status: DC
  Administered 2011-07-24: 2 m[IU]/min via INTRAVENOUS
  Filled 2011-07-24: qty 1000

## 2011-07-24 MED ORDER — SODIUM CHLORIDE 0.9 % IJ SOLN
3.0000 mL | INTRAMUSCULAR | Status: DC | PRN
  Administered 2011-07-25: 3 mL via INTRAVENOUS

## 2011-07-24 MED ORDER — ONDANSETRON HCL 4 MG/2ML IJ SOLN: 4.0000 mg | Freq: Three times a day (TID) | INTRAMUSCULAR | Status: DC | PRN

## 2011-07-24 MED ORDER — SODIUM CHLORIDE 0.9 % IJ SOLN: 3.0000 mL | INTRAMUSCULAR | Status: DC | PRN

## 2011-07-24 MED ORDER — SERTRALINE HCL 25 MG PO TABS
25.0000 mg | ORAL_TABLET | Freq: Every day | ORAL | Status: DC
  Administered 2011-07-25 – 2011-07-28 (×4): 25 mg via ORAL
  Filled 2011-07-24 (×5): qty 1

## 2011-07-24 MED ORDER — ALBUTEROL SULFATE HFA 108 (90 BASE) MCG/ACT IN AERS
2.0000 | INHALATION_SPRAY | RESPIRATORY_TRACT | Status: DC | PRN
  Filled 2011-07-24: qty 6.7

## 2011-07-24 MED ORDER — OXYTOCIN 10 UNIT/ML IJ SOLN
INTRAMUSCULAR | Status: AC
  Filled 2011-07-24: qty 4

## 2011-07-24 MED ORDER — KETOROLAC TROMETHAMINE 30 MG/ML IJ SOLN
30.0000 mg | Freq: Four times a day (QID) | INTRAMUSCULAR | Status: AC | PRN
  Filled 2011-07-24: qty 1

## 2011-07-24 MED ORDER — TETANUS-DIPHTH-ACELL PERTUSSIS 5-2.5-18.5 LF-MCG/0.5 IM SUSP
0.5000 mL | Freq: Once | INTRAMUSCULAR | Status: AC
  Administered 2011-07-26: 0.5 mL via INTRAMUSCULAR

## 2011-07-24 MED ORDER — ACETAMINOPHEN 325 MG PO TABS
650.0000 mg | ORAL_TABLET | ORAL | Status: DC | PRN
  Administered 2011-07-24: 325 mg via ORAL
  Filled 2011-07-24: qty 1

## 2011-07-24 MED ORDER — OXYTOCIN BOLUS FROM INFUSION
250.0000 mL | Freq: Once | INTRAVENOUS | Status: DC
  Filled 2011-07-24: qty 500

## 2011-07-24 MED ORDER — PRENATAL MULTIVITAMIN CH
1.0000 | ORAL_TABLET | Freq: Every day | ORAL | Status: DC
  Administered 2011-07-25 – 2011-07-28 (×4): 1 via ORAL
  Filled 2011-07-24 (×4): qty 1

## 2011-07-24 MED ORDER — ONDANSETRON HCL 4 MG/2ML IJ SOLN: 4.0000 mg | Freq: Four times a day (QID) | INTRAMUSCULAR | Status: DC | PRN

## 2011-07-24 MED ORDER — KETOROLAC TROMETHAMINE 30 MG/ML IJ SOLN
INTRAMUSCULAR | Status: AC
  Administered 2011-07-24: 30 mg via INTRAVENOUS
  Filled 2011-07-24: qty 1

## 2011-07-24 MED ORDER — ONDANSETRON HCL 4 MG/2ML IJ SOLN
INTRAMUSCULAR | Status: DC | PRN
  Administered 2011-07-24: 4 mg via INTRAVENOUS

## 2011-07-24 SURGICAL SUPPLY — 30 items
CLOTH BEACON ORANGE TIMEOUT ST (SAFETY) ×2 IMPLANT
CONTAINER PREFILL 10% NBF 15ML (MISCELLANEOUS) IMPLANT
ELECT REM PT RETURN 9FT ADLT (ELECTROSURGICAL) ×2
ELECTRODE REM PT RTRN 9FT ADLT (ELECTROSURGICAL) ×1 IMPLANT
EXTRACTOR VACUUM M CUP 4 TUBE (SUCTIONS) IMPLANT
GLOVE ECLIPSE 6.0 STRL STRAW (GLOVE) ×2 IMPLANT
GLOVE ECLIPSE 6.5 STRL STRAW (GLOVE) ×2 IMPLANT
GOWN PREVENTION PLUS LG XLONG (DISPOSABLE) ×6 IMPLANT
KIT ABG SYR 3ML LUER SLIP (SYRINGE) IMPLANT
NDL HYPO 25X5/8 SAFETYGLIDE (NEEDLE) ×1 IMPLANT
NEEDLE HYPO 25X5/8 SAFETYGLIDE (NEEDLE) ×2 IMPLANT
NS IRRIG 1000ML POUR BTL (IV SOLUTION) ×2 IMPLANT
PACK C SECTION WH (CUSTOM PROCEDURE TRAY) ×2 IMPLANT
RTRCTR C-SECT PINK 25CM LRG (MISCELLANEOUS) ×1 IMPLANT
SLEEVE SCD COMPRESS KNEE MED (MISCELLANEOUS) ×1 IMPLANT
STAPLER VISISTAT 35W (STAPLE) IMPLANT
SUT PLAIN 0 NONE (SUTURE) IMPLANT
SUT VIC AB 0 CT1 27 (SUTURE) ×6
SUT VIC AB 0 CT1 27XBRD ANBCTR (SUTURE) ×3 IMPLANT
SUT VIC AB 1 CTX 36 (SUTURE) ×4
SUT VIC AB 1 CTX36XBRD ANBCTRL (SUTURE) ×2 IMPLANT
SUT VIC AB 3-0 CT1 27 (SUTURE) ×2
SUT VIC AB 3-0 CT1 TAPERPNT 27 (SUTURE) ×1 IMPLANT
SUT VIC AB 3-0 PS2 18 (SUTURE)
SUT VIC AB 3-0 PS2 18XBRD (SUTURE) IMPLANT
SUT VIC AB 3-0 SH 27 (SUTURE)
SUT VIC AB 3-0 SH 27X BRD (SUTURE) IMPLANT
TOWEL OR 17X24 6PK STRL BLUE (TOWEL DISPOSABLE) ×4 IMPLANT
TRAY FOLEY CATH 14FR (SET/KITS/TRAYS/PACK) ×2 IMPLANT
WATER STERILE IRR 1000ML POUR (IV SOLUTION) ×1 IMPLANT

## 2011-07-24 NOTE — Transfer of Care (Signed)
Immediate Anesthesia Transfer of Care Note  Patient: Alejandra George  Procedure(s) Performed: Procedure(s) (LRB): CESAREAN SECTION (N/A)  Patient Location: PACU  Anesthesia Type: Epidural  Level of Consciousness: sedated  Airway & Oxygen Therapy: Patient Spontanous Breathing  Post-op Assessment: Report given to PACU RN  Post vital signs: Reviewed and stable  Complications: No apparent anesthesia complications222

## 2011-07-24 NOTE — Progress Notes (Signed)
Dr Fredia Sorrow notified of patient, history, tracing, ctx pattern, sve result. Order to admit.

## 2011-07-24 NOTE — Anesthesia Preprocedure Evaluation (Addendum)
Anesthesia Evaluation  Patient identified by MRN, date of birth, ID band Patient awake    Reviewed: Allergy & Precautions, H&P , Patient's Chart, lab work & pertinent test results  Airway Mallampati: II TM Distance: >3 FB Neck ROM: full    Dental No notable dental hx.    Pulmonary neg pulmonary ROS, asthma ,  Poorly controlled asthma breath sounds clear to auscultation  Pulmonary exam normal       Cardiovascular negative cardio ROS  Rhythm:regular Rate:Normal     Neuro/Psych negative neurological ROS  negative psych ROS   GI/Hepatic negative GI ROS, Neg liver ROS,   Endo/Other  negative endocrine ROS  Renal/GU negative Renal ROS     Musculoskeletal   Abdominal   Peds  Hematology negative hematology ROS (+)   Anesthesia Other Findings Asthma-poorly controlled     Depression        Anxiety     History of chicken pox   as a child    History of polydrug abuse    Reproductive/Obstetrics (+) Pregnancy                         Anesthesia Physical Anesthesia Plan  ASA: III  Anesthesia Plan: Epidural   Post-op Pain Management:    Induction:   Airway Management Planned:   Additional Equipment:   Intra-op Plan:   Post-operative Plan:   Informed Consent: I have reviewed the patients History and Physical, chart, labs and discussed the procedure including the risks, benefits and alternatives for the proposed anesthesia with the patient or authorized representative who has indicated his/her understanding and acceptance.     Plan Discussed with:   Anesthesia Plan Comments:        Anesthesia Quick Evaluation

## 2011-07-24 NOTE — Progress Notes (Signed)
Cervix unchanged.  Vertex feels direct OP.  Could not do manual rotation.  Will proceed with Cesarean.

## 2011-07-24 NOTE — Anesthesia Procedure Notes (Signed)
Epidural Patient location during procedure: OB Start time: 07/24/2011 1:58 AM  Staffing Anesthesiologist: Brayton Caves R Performed by: anesthesiologist   Preanesthetic Checklist Completed: patient identified, site marked, surgical consent, pre-op evaluation, timeout performed, IV checked, risks and benefits discussed and monitors and equipment checked  Epidural Patient position: sitting Prep: site prepped and draped and DuraPrep Patient monitoring: continuous pulse ox and blood pressure Approach: midline Injection technique: LOR air and LOR saline  Needle:  Needle type: Tuohy  Needle gauge: 17 G Needle length: 9 cm Needle insertion depth: 5 cm cm Catheter type: closed end flexible Catheter size: 19 Gauge Catheter at skin depth: 10 cm Test dose: negative  Assessment Events: blood not aspirated, injection not painful, no injection resistance, negative IV test and no paresthesia  Additional Notes Patient identified.  Risk benefits discussed including failed block, incomplete pain control, headache, nerve damage, paralysis, blood pressure changes, nausea, vomiting, reactions to medication both toxic or allergic, and postpartum back pain.  Patient expressed understanding and wished to proceed.  All questions were answered.  Sterile technique used throughout procedure and epidural site dressed with sterile barrier dressing. No paresthesia or other complications noted.The patient did not experience any signs of intravascular injection such as tinnitus or metallic taste in mouth nor signs of intrathecal spread such as rapid motor block. Please see nursing notes for vital signs.

## 2011-07-24 NOTE — Progress Notes (Signed)
Still 8 cm.  Vtx is 0 with a contraction.  MVU's are 195.  FHT are OK with good variability and some mild decelerations with contractions.  I will recheck her in 2 hours and consider moving to cesarean delivery if she is not ready to start second stage.

## 2011-07-24 NOTE — Anesthesia Postprocedure Evaluation (Signed)
  Anesthesia Post-op Note  Patient: Alejandra George  Procedure(s) Performed: Procedure(s) (LRB): CESAREAN SECTION (N/A)  Patient Location: PACU  Anesthesia Type: Epidural  Level of Consciousness: awake, alert  and oriented  Airway and Oxygen Therapy: Patient Spontanous Breathing  Post-op Pain: mild  Post-op Assessment: Post-op Vital signs reviewed, Patient's Cardiovascular Status Stable, Respiratory Function Stable, Patent Airway, No signs of Nausea or vomiting, Pain level controlled, No headache and No backache  Post-op Vital Signs: Reviewed and stable  Complications: No apparent anesthesia complications

## 2011-07-24 NOTE — H&P (Signed)
21 y.o. G1P0  Estimated Date of Delivery: 07/20/11 admitted at 40/[redacted] weeks gestation in labor.  Prenatal Transfer Tool  Maternal Diabetes: No Genetic Screening: Normal Maternal Ultrasounds/Referrals: Normal Fetal Ultrasounds or other Referrals:  None Maternal Substance Abuse:  No Significant Maternal Medications:  None Significant Maternal Lab Results: None Other Significant Pregnancy Complications:  None  Afebrile, VSS Heart and Lungs: No active disease Abdomen: soft, gravid, EFW AGA. Cervical exam:  8 cm (was <3 cm on admission).  Impression: Term pregnancy in labor.  8 cm dilatation for 2 hours.  Plan:  AROM (clear), IV pitocin augmentation.

## 2011-07-24 NOTE — Op Note (Addendum)
Patient Name: Alejandra George MRN: [id number]  Date of Surgery: 07/24/2011    PREOPERATIVE DIAGNOSIS: arrest  of labor  POSTOPERATIVE DIAGNOSIS: arrest  of labor   PROCEDURE: Low transverse cesarean section  SURGEON: Caralyn Guile. Arlyce Dice M.D.  ANESTHESIA: Epidural  ESTIMATED BLOOD LOSS: 1000 ml  FINDINGS: Female, 8 lbs. 10 oz., Apgar 8,9: OP presentation, normal adnexa and uterus.   INDICATIONS: This is a 21 y.o.  [place] 0 who is admitted In labor.  Patient arrested at 8 cm despite adequate trial of labor with pitocin and confirmed with [facility].  Vertex was fixed in the OP presentation.  PROCEDURE IN DETAIL: The patient was taken to the operating room and the epidural that had been placed in labor was re injected for surgical anesthesia.  She was then placed in the supine position with left lateral displacement of the uterus. The abdomen was prepped and draped in a sterile fashion and the bladder was catheterized.  A low transverse abdominal incision was made and carried down to the fascia. The fascia was opened transversely and the rectus sheath was dissected from the underlying rectus muscle. The rectus midline was identified and opened by sharp and blunt dissection. The peritoneum was opened. An Alexis retractor was placed and the lower uterine segment was identified, entered transversely by careful sharp dissection, and extended bluntly.  The infant was delivered without difficulty. The placenta was sent for cord blood collection. The uterus was bluntly curettage. The lower segment was closed with running interlocking Vicryl 1 suture.  A second imbricating Vicryl 1 suture line was placed.  Hemostasis was obtained with vertical mattress sutures. The peritoneum and rectus muscle were closed in the midline with running 3-0 Vicryl suture. The fascia was closed with running 0 Vicryl suture and the skin was closed with staples. All sponge and instrument counts were correct.  The patient tolerated the  procedure well and left the operating room in good condition.

## 2011-07-25 ENCOUNTER — Encounter (HOSPITAL_COMMUNITY): Payer: Self-pay | Admitting: Obstetrics & Gynecology

## 2011-07-25 LAB — CBC
MCH: 30.6 pg (ref 26.0–34.0)
MCV: 91.8 fL (ref 78.0–100.0)
Platelets: 167 10*3/uL (ref 150–400)
RBC: 2.55 MIL/uL — ABNORMAL LOW (ref 3.87–5.11)
RDW: 13.7 % (ref 11.5–15.5)
WBC: 13.4 10*3/uL — ABNORMAL HIGH (ref 4.0–10.5)

## 2011-07-25 LAB — ABO/RH: ABO/RH(D): O POS

## 2011-07-25 NOTE — Progress Notes (Signed)
Subjective: Postpartum Day 1: Cesarean Delivery Patient reports Doing well, uncomfortable but pain controlled. Foley catheter still in place  Objective: Vital signs in last 24 hours: Filed Vitals:   07/25/11 0333 07/25/11 0335 07/25/11 0400 07/25/11 0501  BP: 95/67 108/67 95/63   Pulse: 110 112 101 90  Temp:   98.7 F (37.1 C)   TempSrc:   Oral   Resp:   18   Height:      Weight:      SpO2:   94% 96%    Physical Exam:  General: alert, cooperative and appears stated age Lochia: appropriate Uterine Fundus: firm Incision: healing well   Basename 07/25/11 0545 07/24/11 0024  HGB 7.8* 11.5*  HCT 23.4* 34.4*    Assessment/Plan: Status post Cesarean section. Doing well postoperatively.  Continue current care. Wants to restart zoloft, ok for 25mg  daily Will have circ completed in office  Malayasia Mirkin H. 07/25/2011, 10:14 AM

## 2011-07-25 NOTE — Addendum Note (Signed)
Addendum  created 07/25/11 1610 by Algis Greenhouse, CRNA   Modules edited:Notes Section

## 2011-07-25 NOTE — Anesthesia Postprocedure Evaluation (Signed)
  Anesthesia Post Note  Patient: Alejandra George  Procedure(s) Performed: Procedure(s) (LRB): CESAREAN SECTION (N/A)  Anesthesia type: Epidural  Patient location: Mother/Baby  Post pain: Pain level controlled  Post assessment: Post-op Vital signs reviewed  Last Vitals:  Filed Vitals:   07/25/11 0501  BP:   Pulse: 90  Temp:   Resp:     Post vital signs: Reviewed  Level of consciousness:alert  Complications: No apparent anesthesia complications

## 2011-07-26 MED ORDER — FERROUS SULFATE 325 (65 FE) MG PO TABS
325.0000 mg | ORAL_TABLET | Freq: Three times a day (TID) | ORAL | Status: DC
  Administered 2011-07-26 – 2011-07-28 (×6): 325 mg via ORAL
  Filled 2011-07-26 (×7): qty 1

## 2011-07-26 MED ORDER — MENTHOL 3 MG MT LOZG: 1.0000 | LOZENGE | OROMUCOSAL | Status: DC | PRN

## 2011-07-26 NOTE — Clinical Social Work Maternal (Signed)
    Clinical Social Work Department PSYCHOSOCIAL ASSESSMENT - MATERNAL/CHILD 07/26/2011  Patient:  Alejandra George, Alejandra George  Account Number:  [id number]  Admit Date:  07/23/2011  Marjo Bicker Name:   Alejandra George [facility]    Clinical Social Worker:  Alejandra George   Date/Time:  07/26/2011 11:00 AM  Date Referred:  07/26/2011   Referral source  CN     Referred reason  Substance Abuse  Depression/Anxiety   Other referral source:    I:  FAMILY / HOME ENVIRONMENT Child's legal guardian:  PARENT  Guardian - Name Guardian - Age Guardian - Address  [place] D[address] [place], [place] [address]  Yetta Flock, Montez Hageman. 34    Other household support members/support persons Name Relationship DOB  Vevelyn Francois MOTHER   Suella Grove    BROTHER    Other support:   Grandparents    II  PSYCHOSOCIAL DATA Information Source:  Patient Interview  Surveyor, quantity and Community Resources Employment:   Radio broadcast assistant resources:  Media planner If [employer] - [place]:  [place] Other  Food Stamps  WIC   School / Grade:   Maternity Care Coordinator / Child Services Coordination / Early Interventions:  Cultural issues impacting care:    III  STRENGTHS Strengths  Adequate Resources  Home prepared for Child (including basic supplies)  Supportive family/friends   Strength comment:    IV  RISK FACTORS AND CURRENT PROBLEMS Current Problem:  YES   Risk Factor & Current Problem Patient Issue Family Issue Risk Factor / Current Problem Comment  Substance Abuse Y N Hx polysubstance abuse  Mental Illness N N Hx of depression/anxiety    V  SOCIAL WORK ASSESSMENT Sw referral received to assess history of depression/anxiety and substance abuse.  Pt told Sw that she was diagnosed with depression/anxiety at 21 years old by Dr. Carolanne Grumbling.  She was prescribed medication at that time and continued taking medication regularly.  Most recently, pt was prescribed Lamictal and Zoloft by Dr.  Nida Boatman.  Once pregnancy was confirmed, she stopped taking the Lamictal but continued Zoloft (at decreased dose of 25mg ).  According to the pt, she was able to cope well without the medication however plans to follow up with a psychiatrist upon discharge.  Pt is currently seeing a therapist, Carola Frost and plans to continue therapy once discharge.  Pt admits to SI in 5/12, which resulted in Behavioral Health Admission.  She denied having a plan at that time, rather thoughts.  Pt admits to a substance abuse history.  She named her drugs of choice as alcohol, benzodiazapine and cocaine.  Pt denies any use since 05/2010.  She participated in IOP and stayed at the [facility] for 10 months.  She recently moved in with her parents, who she describes as supportive.  Pt has all the necessary supplies for the infant and good family support. Sw observed the pt bonding well with the infant.  Sw encouraged pt to continue therapy and seek medical attention if needed.      VI SOCIAL WORK PLAN Social Work Plan  No Further Intervention Required / No Barriers to Discharge   Type of pt/family education:   If child protective services report - county:   If child protective services report - date:   Information/referral to community resources comment:   Other social work plan:

## 2011-07-26 NOTE — Progress Notes (Signed)
Patient is eating, ambulating, voiding.  Pain control is good. Feeling emotionally well.  No complaints.  Appropriate lochia.  Filed Vitals:   07/25/11 1300 07/25/11 1702 07/25/11 2115 07/26/11 0505  BP: 100/62 110/70 98/63 105/62  Pulse: 100 94 92 102  Temp: 98.3 F (36.8 C) 98 F (36.7 C) 98.1 F (36.7 C) 98.3 F (36.8 C)  TempSrc: Oral Oral Oral Oral  Resp: 18 20 18 18   Height:      Weight:      SpO2: 99% 98%      Fundus firm No CT  Lab Results  Component Value Date   WBC 13.4* 07/25/2011   HGB 7.8* 07/25/2011   HCT 23.4* 07/25/2011   MCV 91.8 07/25/2011   PLT 167 07/25/2011    --/--/O POS (07/10 0024)/RI  A/P Post op day #2 s/p c/s for FTP H/o drug abuse, major depression, recent behavioral health admission - social work consult placed. Circ in office.  Routine care.  Expect d/c tomorrow.  Philip Aspen

## 2011-07-27 MED ORDER — PNEUMOCOCCAL VAC POLYVALENT 25 MCG/0.5ML IJ INJ
0.5000 mL | INJECTION | INTRAMUSCULAR | Status: AC
  Administered 2011-07-28: 0.5 mL via INTRAMUSCULAR
  Filled 2011-07-27: qty 0.5

## 2011-07-27 NOTE — Progress Notes (Signed)
Patient is eating, ambulating, voiding.  Pain control is good. + flatus.  Apprpropriate lochia.  Pt concerned about breast feeding and engorgement.  Desires to stay add'l day for continued lactation assistance.  Filed Vitals:   07/26/11 1400 07/26/11 1853 07/26/11 2141 07/27/11 0545  BP: 117/76  126/75 102/66  Pulse: 109  101 91  Temp: 98.2 F (36.8 C) 99.1 F (37.3 C) 98.4 F (36.9 C) 98.4 F (36.9 C)  TempSrc:  Oral Oral Oral  Resp: 18  18 18   Height:      Weight:      SpO2:   97%     Fundus firm Inc: c/d/i Ext: no CT  Lab Results  Component Value Date   WBC 13.4* 07/25/2011   HGB 7.8* 07/25/2011   HCT 23.4* 07/25/2011   MCV 91.8 07/25/2011   PLT 167 07/25/2011    --/--/O POS (07/10 0024)/RI  A/P Post op day #3 c/s FTP H/o drug use/depression/recent psych admit - s/p SW visit, pt advised to call with any concerns. Anemia - cont. FeSO4 tid  Routine care.  Expect d/c tomorrow.  Philip Aspen

## 2011-07-28 MED ORDER — IBUPROFEN 600 MG PO TABS: 600.0000 mg | ORAL_TABLET | Freq: Four times a day (QID) | ORAL | Status: AC

## 2011-07-28 MED ORDER — OXYCODONE-ACETAMINOPHEN 5-325 MG PO TABS: 1.0000 | ORAL_TABLET | ORAL | Status: AC | PRN

## 2011-07-28 MED ORDER — FERROUS SULFATE 325 (65 FE) MG PO TABS: 325.0000 mg | ORAL_TABLET | Freq: Two times a day (BID) | ORAL | Status: DC

## 2011-07-28 NOTE — Discharge Summary (Signed)
Obstetric Discharge Summary Reason for Admission: onset of labor Prenatal Procedures: none Intrapartum Procedures: cesarean: low cervical, transverse, FTP Postpartum Procedures: none Complications-Operative and Postpartum: none Hemoglobin  Date Value Range Status  07/25/2011 7.8* 12.0 - 15.0 g/dL Final     DELTA CHECK NOTED     REPEATED TO VERIFY     HCT  Date Value Range Status  07/25/2011 23.4* 36.0 - 46.0 % Final    Physical Exam:  General: alert and cooperative Lochia: appropriate Uterine Fundus: firm Incision: healing well, no significant drainage, no dehiscence, no significant erythema DVT Evaluation: No evidence of DVT seen on physical exam.  Discharge Diagnoses: Term Pregnancy-delivered  Discharge Information: Date: 07/28/2011 Activity: pelvic rest Diet: routine Medications: PNV, Ibuprofen, Iron and Percocet Condition: stable Instructions: refer to practice specific booklet Discharge to: home Follow-up Information    Follow up with Mickel Baas, MD in 4 weeks.   Contact information:   [address] [place] [address] [phone]          Newborn Data: Live born female  Birth Weight: 8 lb 9.9 oz (3910 g) APGAR: 8, 9  Home with mother.  Alejandra George 07/28/2011, 12:36 PM

## 2011-08-25 ENCOUNTER — Encounter (HOSPITAL_COMMUNITY): Payer: Self-pay | Admitting: Emergency Medicine

## 2011-08-25 ENCOUNTER — Emergency Department (HOSPITAL_COMMUNITY)
Admission: EM | Admit: 2011-08-25 | Discharge: 2011-08-25 | Disposition: A | Discharge status: Home / Self Care | Payer: BC Managed Care – PPO | Attending: Emergency Medicine | Admitting: Emergency Medicine

## 2011-08-25 DIAGNOSIS — B379 Candidiasis, unspecified: Secondary | ICD-10-CM | POA: Insufficient documentation

## 2011-08-25 DIAGNOSIS — B377 Candidal sepsis: Secondary | ICD-10-CM

## 2011-08-25 DIAGNOSIS — O9122 Nonpurulent mastitis associated with the puerperium: Secondary | ICD-10-CM | POA: Insufficient documentation

## 2011-08-25 DIAGNOSIS — O99893 Other specified diseases and conditions complicating puerperium: Secondary | ICD-10-CM | POA: Insufficient documentation

## 2011-08-25 MED ORDER — FLUCONAZOLE 100 MG PO TABS: 200.0000 mg | ORAL_TABLET | Freq: Every day | ORAL | Status: DC

## 2011-08-25 MED ORDER — DIPHENHYDRAMINE HCL 50 MG/ML IJ SOLN
25.0000 mg | Freq: Once | INTRAMUSCULAR | Status: DC
  Filled 2011-08-25: qty 1

## 2011-08-25 MED ORDER — FLUCONAZOLE 200 MG PO TABS
200.0000 mg | ORAL_TABLET | Freq: Every day | ORAL | Status: DC
  Administered 2011-08-25: 200 mg via ORAL
  Filled 2011-08-25: qty 1

## 2011-08-25 MED ORDER — CEPHALEXIN 500 MG PO CAPS: 500.0000 mg | ORAL_CAPSULE | Freq: Four times a day (QID) | ORAL | Status: DC

## 2011-08-25 MED ORDER — DIPHENHYDRAMINE HCL 25 MG PO CAPS
25.0000 mg | ORAL_CAPSULE | Freq: Once | ORAL | Status: AC
  Administered 2011-08-25: 25 mg via ORAL
  Filled 2011-08-25: qty 1

## 2011-08-25 MED ORDER — DIPHENHYDRAMINE HCL 25 MG PO TABS: 25.0000 mg | ORAL_TABLET | Freq: Four times a day (QID) | ORAL | Status: DC | PRN

## 2011-08-25 MED ORDER — FLUCONAZOLE 200 MG PO TABS
200.0000 mg | ORAL_TABLET | Freq: Every day | ORAL | Status: DC
  Filled 2011-08-25: qty 1

## 2011-08-25 NOTE — ED Provider Notes (Signed)
History     CSN: [id number]  Arrival date & time 08/25/11  7829   First MD Initiated Contact with Patient 08/25/11 [phone]      Chief Complaint  Patient presents with  . Rash    (Consider location/radiation/quality/duration/timing/severity/associated sxs/prior treatment) HPI Comments: G1P1 patient 3 weeks post partum comes in with cc of rash. Pt has developed bilateral nipple, and breast rash, left wirse than right that is now spreading to her abdomen and thigh. Pt is using an antifungal ointment, since her child has oral thrush - but has no improvement in her hx. The rash is described as erythematous, and itchy. There is no associated n/v/f/c. The rash has no sloughed off, nor has it bleed or hurt. Pt has never breast fed, she does pump herbreast, and her nipples are sore when pumping. She denies any hx of allergies, or exposure to any allergens recently. Pt has a PCP and OB, and is going to follow up with them this week.  Patient is a 21 y.o. female presenting with rash. The history is provided by the patient and medical records.  Rash     Past Medical History  Diagnosis Date  . Asthma   . Depression   . Anxiety   . History of chicken pox     as a child  . History of polydrug abuse     Past Surgical History  Procedure Date  . No past surgeries   . Cesarean section 07/24/2011    Procedure: CESAREAN SECTION;  Surgeon: Mickel Baas, MD;  Location: [facility];  Service: Gynecology;  Laterality: N/A;    No family history on file.  History  Substance Use Topics  . Smoking status: Never Smoker   . Smokeless tobacco: Not on file  . Alcohol Use: No    OB History    Grav Para Term Preterm Abortions TAB SAB Ect Mult Living   1 1 1  0 0 0 0 0 0 1      Review of Systems  Constitutional: Positive for activity change.  HENT: Negative for neck pain.   Respiratory: Negative for shortness of breath.   Cardiovascular: Negative for chest pain.  Gastrointestinal: Negative for  nausea, vomiting and abdominal pain.  Genitourinary: Negative for dysuria.  Skin: Positive for rash.  Neurological: Negative for headaches.    Allergies  Penicillins  Home Medications   Current Outpatient Rx  Name Route Sig Dispense Refill  . ALBUTEROL SULFATE HFA 108 (90 BASE) MCG/ACT IN AERS Inhalation Inhale 2 puffs into the lungs daily as needed. For asthma    . FERROUS SULFATE 325 (65 FE) MG PO TABS Oral Take 325 mg by mouth 2 (two) times daily with a meal.    . PRENATAL 27-0.8 MG PO TABS Oral Take 1 tablet by mouth daily.    . SERTRALINE HCL 25 MG PO TABS Oral Take 25 mg by mouth daily.    . CEPHALEXIN 500 MG PO CAPS Oral Take 1 capsule (500 mg total) by mouth 4 (four) times daily. 40 capsule 0  . DIPHENHYDRAMINE HCL 25 MG PO TABS Oral Take 1 tablet (25 mg total) by mouth every 6 (six) hours as needed for itching. 30 tablet 0  . FLUCONAZOLE 100 MG PO TABS Oral Take 2 tablets (200 mg total) by mouth daily. 14 tablet 0    BP 114/77  Pulse 64  Temp 98 F (36.7 C)  Resp 16  SpO2 99%  Breastfeeding? Yes  Physical Exam  Constitutional: She is oriented to person, place, and time. She appears well-developed and well-nourished.  HENT:  Head: Normocephalic and atraumatic.  Eyes: EOM are normal. Pupils are equal, round, and reactive to light.  Neck: Neck supple.  Cardiovascular: Normal rate, regular rhythm and normal heart sounds.   No murmur heard. Pulmonary/Chest: Effort normal. No respiratory distress.  Abdominal: Soft. She exhibits no distension. There is no tenderness. There is no rebound and no guarding.  Neurological: She is alert and oriented to person, place, and time.  Skin: Skin is warm and dry. Rash noted.       Erythematous macular papula lesion over the bilateral breast, lower abdomen and bilateral groin. No pustules, no fluctuance. Nipple shows no sin breakdown, there is no areolar engorgement.    ED Course  Procedures (including critical care time)  Labs  Reviewed - No data to display No results found.   1. Mastitis, postpartum   2. Candida infection, disseminated       MDM  Ddx: Candida - disseminated Mastitis Cellulitis Abscess Allergic reaction  A/P: 21 y/o healthy women comes in 3 weeks post partum with worsening pruritic rash. The erytthematous leision appears consistent with a candida infection - however, the lesion is not improving with topical anti fungal. No clinical evidence of abscess, deep infection of the breast, and negative constitutionals on ROS are reassuring. Pt doesn't breast feed, and thus even though her child has oral thrush - her chance of having a candida mastitis are not as high as it would have been if she was breast feeding. To be on the safe side, we will treat he as a cellulitis as well, and start her on keflex. Pt has been advised to f/u with her pcp in 3 days to ensure her sx are improving, and to return to the ER if her sx worsen.        Derwood Kaplan, MD 08/25/11 [phone]

## 2011-08-25 NOTE — ED Notes (Signed)
Pt alert, arrives from home, c/o rash to breast, lower abd, thighs, onset was last week, pt denies recent exposures, raised red bumps noted to abd, resp even unlabored, skin pwd, + itching

## 2011-08-28 ENCOUNTER — Encounter: Payer: Self-pay | Admitting: Sports Medicine

## 2011-08-28 ENCOUNTER — Ambulatory Visit (INDEPENDENT_AMBULATORY_CARE_PROVIDER_SITE_OTHER): Payer: BC Managed Care – PPO | Admitting: Sports Medicine

## 2011-08-28 VITALS — BP 101/56 | HR 83 | Temp 97.0°F | Wt 147.0 lb

## 2011-08-28 DIAGNOSIS — O91219 Nonpurulent mastitis associated with pregnancy, unspecified trimester: Secondary | ICD-10-CM

## 2011-08-28 DIAGNOSIS — T7840XA Allergy, unspecified, initial encounter: Secondary | ICD-10-CM

## 2011-08-28 DIAGNOSIS — F39 Unspecified mood [affective] disorder: Secondary | ICD-10-CM | POA: Insufficient documentation

## 2011-08-28 DIAGNOSIS — J45909 Unspecified asthma, uncomplicated: Secondary | ICD-10-CM

## 2011-08-28 DIAGNOSIS — J4520 Mild intermittent asthma, uncomplicated: Secondary | ICD-10-CM | POA: Insufficient documentation

## 2011-08-28 DIAGNOSIS — O9122 Nonpurulent mastitis associated with the puerperium: Secondary | ICD-10-CM | POA: Insufficient documentation

## 2011-08-28 MED ORDER — METHYLPREDNISOLONE SODIUM SUCC 125 MG IJ SOLR
125.0000 mg | Freq: Once | INTRAMUSCULAR | Status: AC
  Administered 2011-08-28: 125 mg via INTRAMUSCULAR

## 2011-08-28 MED ORDER — METHYLPREDNISOLONE SODIUM SUCC 125 MG IJ SOLR: 125.0000 mg | Freq: Once | INTRAMUSCULAR | Status: DC

## 2011-08-28 MED ORDER — SULFAMETHOXAZOLE-TRIMETHOPRIM 800-160 MG PO TABS: 1.0000 | ORAL_TABLET | Freq: Two times a day (BID) | ORAL | Status: AC

## 2011-08-28 NOTE — Assessment & Plan Note (Signed)
Likely related to cephalexin. She does have a known allergy to penicillin. Solu-Medrol 125 mg intramuscular. I would like to see her back in a week to ensure that this resolves.

## 2011-08-28 NOTE — Assessment & Plan Note (Signed)
She's had an allergic reaction to cephalexin. I will switch her to Septra double strength one tab twice a day for 7 days. We'll see her back in a week for this.

## 2011-08-28 NOTE — Patient Instructions (Signed)
Breastfeeding, Mastitis Breastfeeding is usually the best way to feed your baby. Breastfed babies tend to be healthier and less affected by disease. Breastfed babies may have better brain development and be less likely to be overweight than formula-fed babies. Breastfeeding also benefits the mother. Breastfeeding reduces the risk of breast cancer. It will give you time to be close to your baby and helps create a strong bond. It also delays the return of your periods, stimulates your uterus to contract back to normal, and helps you lose some of the weight you gained during pregnancy. Breastfeeding should not be painful. If there is tenderness at first, it should gradually go away as the days go by. Poor latch-on and positioning are common causes of sore nipples. This is usually because the baby is not getting enough of the areola (the colored portion around the nipple) into his or her mouth, and is sucking mostly on the nipple. In general, nurse early and often, nurse with the nipple and areola in the baby's mouth, not just the nipple and feed your baby on demand. CAUSES  It is common for many women to have a plugged duct in the breast at some point if she breastfeeds. A plugged milk duct feels like a tender, sore, lump in the breast. It is not accompanied by a fever or other symptoms. It happens when a milk duct does not properly drain, and the breast becomes inflamed (red and sore). Then, pressure builds up behind the plug. A plugged duct usually only occurs in one breast at a time.   A breast infection (mastitis), on the other hand, is soreness or a lump in the breast that can be accompanied by a fever or flu-like symptoms, such as feeling run down or very achy. Some women with a breast infection also have nausea and vomiting. You also may have yellowish discharge from the nipple that looks like colostrum, or the breasts feel warm or hot to the touch and appear pink or red. A breast infection can occur when  other family members have a cold or the flu, and like a plugged duct, it usually only occurs in one breast. It is not always easy to tell the difference between a breast infection and a plugged duct because both have similar symptoms and can improve within 24 to 48 hours.   Using one position to breastfeed may not empty your breasts properly or completely and could lead to engorgement and mastitis.   Wearing a bra that is too tight may restrict the flow of your milk.   Mastitis develops because bacteria get under the skin through cracks in the nipple and into the breasts and an infection develops.  TREATMENT    Breastfeed or pump the breast more often on the affected side. This helps loosen the plug, keeps the milk moving freely, and the breast from becoming overly full. Nursing every 2 hours, both day and night on the affected side first can be helpful.   Getting extra sleep or relaxing with your feet up can help speed healing. Often, a plugged duct or breast infection is the first sign that a mother is doing too much and becoming overly tired.   Wear a well-fitting, supportive bra that is not too tight, since this can constrict milk ducts.   If you do not feel better within 24 hours of trying these steps, and you have a fever or your symptoms worsen, call your caregiver. You may need an antibiotic. If you have  a breast infection in which both breasts look affected, or there is pus or blood in the milk, red streaks near the painful area, or your symptoms came suddenly, see your caregiver right away.   Even if you need an antibiotic, continuing to breastfeed during treatment is best for both you and your baby. Most antibiotics passed in your breast milk will not hurt your baby.   Increase your fluid intake especially if you have a fever   If mastitis is not treated quickly and properly, you may develop a breast abscess.  THRUSH   Thrush (yeast) is a fungal infection that can form on your  nipples or in your breast because it thrives on milk.   The infection forms from an overgrowth of the candida organism. Candida usually lives in our bodies and is kept at healthy levels by the natural bacteria in our bodies. But, when the natural balance of bacteria is upset, candida can overgrow, causing an infection. Some of the things that can cause thrush include:    Having an overly moist environment on your skin or nipples that are sore or cracked.   Taking antibiotics, birth control pills, or steroids.   Having a diet that contains large amounts of sugar or foods with yeast.   Women with diabetes have a higher incidence of thrush and fungal infections.   Having a chronic illness like HIV infection, diabetes, or anemia.  These are symptoms of thrush:  Having sore nipples that last more than a few days, even when your baby's latch and positioning is correct.   Getting sore nipples suddenly after several weeks of normal nursing.   Pink, flaky, shiny, itchy, or cracked nipples, or deep pink and blistered nipples.   Shooting pains deep in the breast during or after feedings, or achy breasts. This may be from a candida infection of the milk ducts.   The infection can form in your baby's mouth from having contact with your nipples, and appear as little white spots on the inside of the cheeks, gums, or tongue. It also can appear as a diaper rash (small red dots around a rash) on your baby that will not go away by using regular diaper rash ointments. Many babies with thrush refuse to nurse, or are gassy or cranky.  HOME CARE INSTRUCTIONS  If you or your baby have any of the above symptoms, contact your caregiver and your baby's caregiver so you both can be correctly diagnosed.   You can get medication for your nipples and for your baby. Medication for a mother may be an ointment for the nipples, and your baby can be given a liquid medication for his or her mouth, or an ointment for any  diaper rash.   Change disposable nursing pads often and wash any towels or clothing that have come in contact with the yeast in very hot water above 122 F (50 C).   Wear a clean bra every day.   Only use cotton pads.   Wash your hands often, and wash your baby's hands often, especially if he or she sucks on his or her fingers.   Boil any pacifiers, bottle nipples, or toys your baby puts in his or her mouth once a day for 20 minutes to kill the thrush. After 1 week of treatment, discard pacifiers and nipples and buy new ones. Boil all breast pump parts that touch the milk for 20 minutes daily.   Make sure other family members are free of  thrush or other fungal infections. If they have symptoms, get treatment for them.  MAKE SURE YOU:    Understand these instructions.   Will watch your condition.   Will get help right away if you are not doing well or get worse.  Document Released: 04/27/2004 Document Revised: 12/20/2010 Document Reviewed: 09/16/2007 [facility] Patient Information 2012 [place], [place].

## 2011-08-28 NOTE — Assessment & Plan Note (Signed)
Well controlled, with symptoms only approximately once a week, when running. This is consistent with exercise-induced bronchoconstriction. Would like to do 2 puffs of albuterol 15 minutes before exercise.

## 2011-08-28 NOTE — Assessment & Plan Note (Signed)
We will continue her sertraline. We can discuss this further at subsequent visits

## 2011-08-28 NOTE — Progress Notes (Signed)
Patient ID: Alejandra George, female   DOB: [birth year], 21 y.o.   MRN: [id number] Subjective:    CC: Establish care. Rash  HPI: Alejandra George is a very pleasant 21 year old female who comes in to establish care. She did recently have a baby approximately one month ago, and unfortunately developed mastitis.  She was placed on cephalexin which unfortunately she had allergic reaction to. This consisted of a rash on her back, buttock, and legs and is intensely pruritic.  She denies any wheeze, shortness of breath, or mucocutaneous involvement.  She also has known mild asthma, she only gets exacerbations, and needs albuterol when she runs, and occasionally during allergy season. She denies any nighttime symptoms, and does not get daily, and usually not even weekly symptoms.  Past medical history, Surgical history, Family history, Social history, Allergies, and medications have been entered into the medical record, reviewed, and no changes needed.   Review of Systems: No headache, visual changes, nausea, vomiting, diarrhea, constipation, dizziness, abdominal pain, skin rash, fevers, chills, night sweats, weight loss, chest pain, body aches, joint swelling, muscle aches, or shortness of breath.   Objective:    General: Well Developed, well nourished, and in no acute distress.  Neuro: Alert and oriented x3, extra-ocular muscles intact.  HEENT: Normocephalic, atraumatic, pupils equal round reactive to light, neck supple, no masses, no lymphadenopathy, thyroid nonpalpable.  Skin: Maculopapular rash noted on buttocks, as well as lower legs. Cardiac: Regular rate and rhythm, no murmurs rubs or gallops.  Respiratory: Clear to auscultation bilaterally. Not using accessory muscles, speaking in full sentences.  Abdominal: Soft, nontender, nondistended, positive bowel sounds, no masses, no organomegaly.  Cesarean section scar is well-healed.  Musculoskeletal: Shoulder, elbow, wrist, hip, knee, ankle stable, and with full  range of motion.   Impression and Recommendations:

## 2011-08-28 NOTE — Addendum Note (Signed)
Addended by: Chalmers Cater on: 08/28/2011 11:26 AM   Modules accepted: Orders

## 2011-08-29 ENCOUNTER — Telehealth: Payer: Self-pay | Admitting: *Deleted

## 2011-08-29 MED ORDER — PREDNISONE 50 MG PO TABS: ORAL_TABLET | ORAL | Status: AC

## 2011-08-29 NOTE — Telephone Encounter (Signed)
Pt states that you told her that if she was still having problems today that you would sen an rx for steroids. Pt states she is itching and taking Benadryl but feels she does need the steroids. Please advise.

## 2011-08-29 NOTE — Telephone Encounter (Signed)
Pt informed

## 2011-08-29 NOTE — Telephone Encounter (Signed)
Will add prednisone to regimen.

## 2011-11-30 ENCOUNTER — Emergency Department (INDEPENDENT_AMBULATORY_CARE_PROVIDER_SITE_OTHER)
Admission: EM | Admit: 2011-11-30 | Discharge: 2011-11-30 | Disposition: A | Discharge status: Home / Self Care | Payer: BC Managed Care – PPO | Source: Home / Self Care | Attending: Emergency Medicine | Admitting: Emergency Medicine

## 2011-11-30 DIAGNOSIS — J069 Acute upper respiratory infection, unspecified: Secondary | ICD-10-CM

## 2011-11-30 DIAGNOSIS — J029 Acute pharyngitis, unspecified: Secondary | ICD-10-CM

## 2011-11-30 LAB — POCT RAPID STREP A (OFFICE): Rapid Strep A Screen: NEGATIVE

## 2011-11-30 MED ORDER — AZITHROMYCIN 250 MG PO TABS: ORAL_TABLET | ORAL | Status: DC

## 2011-11-30 NOTE — ED Notes (Signed)
Sore throat x 1 day 

## 2011-11-30 NOTE — ED Provider Notes (Signed)
History     CSN: [id number]  Arrival date & time 11/30/11  1003   First MD Initiated Contact with Patient 11/30/11 1024      Chief Complaint  Patient presents with  . Sore Throat    (Consider location/radiation/quality/duration/timing/severity/associated sxs/prior treatment) HPI Daira is a 21 y.o. female who complains of onset of cold symptoms for 1 days.  The symptoms are constant and mild-moderate in severity.  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 + sore throat No cough No pleuritic pain No wheezing +nasal congestion + post-nasal drainage + sinus pain/pressure No chest congestion No itchy/red eyes No earache No hemoptysis No SOB + chills/sweats No fever No nausea No vomiting No abdominal pain No diarrhea No skin rashes + fatigue + myalgias No headache    Past Medical History  Diagnosis Date  . Asthma   . Depression   . Anxiety   . History of chicken pox     as a child  . History of polydrug abuse     Past Surgical History  Procedure Date  . No past surgeries   . Cesarean section 07/24/2011    Procedure: CESAREAN SECTION;  Surgeon: Mickel Baas, MD;  Location: [facility];  Service: Gynecology;  Laterality: N/A;    Family History  Problem Relation Age of Onset  . Diabetes Mother   . Hypertension Mother   . Hypertension Maternal Grandmother   . Diabetes Maternal Grandmother   . Cancer Maternal Grandmother     Breast  . Cancer Maternal Grandfather     Lung    History  Substance Use Topics  . Smoking status: Never Smoker   . Smokeless tobacco: Never Used  . Alcohol Use: No    OB History    Grav Para Term Preterm Abortions TAB SAB Ect Mult Living   1 1 1  0 0 0 0 0 0 1      Review of Systems  All other systems reviewed and are negative.    Allergies  Penicillins and Cephalosporins  Home Medications   Current Outpatient Rx  Name  Route  Sig   Dispense  Refill  . LAMOTRIGINE 25 MG PO TABS   Oral   Take 25 mg by mouth daily.         . ALBUTEROL SULFATE HFA 108 (90 BASE) MCG/ACT IN AERS   Inhalation   Inhale 2 puffs into the lungs daily as needed. For asthma         . AZITHROMYCIN 250 MG PO TABS      Use as directed   1 each   0   . DIPHENHYDRAMINE HCL 25 MG PO TABS   Oral   Take 1 tablet (25 mg total) by mouth every 6 (six) hours as needed for itching.   30 tablet   0   . FERROUS SULFATE 325 (65 FE) MG PO TABS   Oral   Take 325 mg by mouth 2 (two) times daily with a meal.         . PRENATAL 27-0.8 MG PO TABS   Oral   Take 1 tablet by mouth daily.         . SERTRALINE HCL 25 MG PO TABS   Oral   Take 50 mg by mouth daily.            BP 116/73  Pulse 93  Temp 97.9 F (36.6 C) (Oral)  Resp 16  Ht 5\' 5"  (1.651 m)  Wt 145 lb (65.772 kg)  BMI 24.13 kg/m2  SpO2 99%  LMP 11/04/2011  Physical Exam  Nursing note and vitals reviewed. Constitutional: She is oriented to person, place, and time. She appears well-developed and well-nourished.  HENT:  Head: Normocephalic and atraumatic.  Right Ear: Tympanic membrane, external ear and ear canal normal.  Left Ear: Tympanic membrane, external ear and ear canal normal.  Nose: Mucosal edema and rhinorrhea present.  Mouth/Throat: Posterior oropharyngeal erythema present. No oropharyngeal exudate or posterior oropharyngeal edema.  Eyes: No scleral icterus.  Neck: Neck supple.  Cardiovascular: Regular rhythm and normal heart sounds.   Pulmonary/Chest: Effort normal and breath sounds normal. No respiratory distress.  Neurological: She is alert and oriented to person, place, and time.  Skin: Skin is warm and dry.  Psychiatric: She has a normal mood and affect. Her speech is normal.    ED Course  Procedures (including critical care time)   Labs Reviewed  POCT RAPID STREP A (OFFICE)  POCT INFLUENZA A/B   No results found.   1. Acute upper respiratory  infections of unspecified site   2. Acute pharyngitis       MDM  1)  Take the prescribed antibiotic as instructed.  Rapid & flu negative.  Hold Zpak for now and don't take for 5 days if still sick.  I feel this is mostly a viral syndrome that will last a few days.  Encourage hydration and rest this weekend and can take OTC cold meds. 2)  Use nasal saline solution (over the counter) at least 3 times a day. 3)  Use over the counter decongestants like Zyrtec-D every 12 hours as needed to help with congestion.  If you have hypertension, do not take medicines with sudafed.  4)  Can take tylenol every 6 hours or motrin every 8 hours for pain or fever. 5)  Follow up with your primary doctor if no improvement in 5-7 days, sooner if increasing pain, fever, or new symptoms.     Marlaine Hind, MD 11/30/11 1058

## 2011-12-23 ENCOUNTER — Encounter: Payer: Self-pay | Admitting: Sports Medicine

## 2011-12-23 ENCOUNTER — Ambulatory Visit (INDEPENDENT_AMBULATORY_CARE_PROVIDER_SITE_OTHER): Payer: BC Managed Care – PPO

## 2011-12-23 ENCOUNTER — Ambulatory Visit (INDEPENDENT_AMBULATORY_CARE_PROVIDER_SITE_OTHER): Payer: BC Managed Care – PPO | Admitting: Sports Medicine

## 2011-12-23 VITALS — BP 121/80 | HR 61 | Wt 144.0 lb

## 2011-12-23 DIAGNOSIS — R0789 Other chest pain: Secondary | ICD-10-CM

## 2011-12-23 DIAGNOSIS — M79604 Pain in right leg: Secondary | ICD-10-CM

## 2011-12-23 DIAGNOSIS — M797 Fibromyalgia: Secondary | ICD-10-CM | POA: Insufficient documentation

## 2011-12-23 DIAGNOSIS — F1910 Other psychoactive substance abuse, uncomplicated: Secondary | ICD-10-CM | POA: Insufficient documentation

## 2011-12-23 DIAGNOSIS — M5412 Radiculopathy, cervical region: Secondary | ICD-10-CM

## 2011-12-23 DIAGNOSIS — M79609 Pain in unspecified limb: Secondary | ICD-10-CM

## 2011-12-23 DIAGNOSIS — G9332 Myalgic encephalomyelitis/chronic fatigue syndrome: Secondary | ICD-10-CM | POA: Insufficient documentation

## 2011-12-23 MED ORDER — PREDNISONE 50 MG PO TABS: ORAL_TABLET | ORAL | Status: DC

## 2011-12-23 MED ORDER — CYCLOBENZAPRINE HCL 10 MG PO TABS: ORAL_TABLET | ORAL | Status: DC

## 2011-12-23 MED ORDER — MELOXICAM 15 MG PO TABS: ORAL_TABLET | ORAL | Status: DC
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
## 2011-12-23 NOTE — Assessment & Plan Note (Signed)
She did have several conservative measures done while using alcohol, and cocaine. We are going to start from scratch today. C-spine x-rays, formal physical therapy, Mobic, prednisone, Flexeril. We will also x-ray her sternoclavicular joint

## 2011-12-23 NOTE — Assessment & Plan Note (Signed)
Sounds to be radicular vs sciatic. Will look into this in more depth at future visits. She did note that her neck pain was worse.

## 2011-12-30 ENCOUNTER — Ambulatory Visit: Payer: BC Managed Care – PPO | Attending: Sports Medicine | Admitting: Physical Therapy

## 2011-12-30 DIAGNOSIS — M6281 Muscle weakness (generalized): Secondary | ICD-10-CM | POA: Insufficient documentation

## 2011-12-30 DIAGNOSIS — M2550 Pain in unspecified joint: Secondary | ICD-10-CM | POA: Insufficient documentation

## 2011-12-30 DIAGNOSIS — IMO0001 Reserved for inherently not codable concepts without codable children: Secondary | ICD-10-CM | POA: Insufficient documentation

## 2012-01-02 ENCOUNTER — Ambulatory Visit: Payer: BC Managed Care – PPO | Admitting: Physical Therapy

## 2012-01-06 ENCOUNTER — Ambulatory Visit: Payer: BC Managed Care – PPO | Admitting: Physical Therapy

## 2012-01-09 ENCOUNTER — Ambulatory Visit: Payer: BC Managed Care – PPO | Admitting: Physical Therapy

## 2012-01-14 ENCOUNTER — Ambulatory Visit: Payer: BC Managed Care – PPO | Admitting: Physical Therapy

## 2012-01-17 ENCOUNTER — Ambulatory Visit: Payer: BC Managed Care – PPO | Attending: Sports Medicine | Admitting: Physical Therapy

## 2012-01-17 DIAGNOSIS — IMO0001 Reserved for inherently not codable concepts without codable children: Secondary | ICD-10-CM | POA: Insufficient documentation

## 2012-01-17 DIAGNOSIS — M2550 Pain in unspecified joint: Secondary | ICD-10-CM | POA: Insufficient documentation

## 2012-01-17 DIAGNOSIS — M6281 Muscle weakness (generalized): Secondary | ICD-10-CM | POA: Insufficient documentation

## 2012-01-20 ENCOUNTER — Ambulatory Visit: Payer: BC Managed Care – PPO | Admitting: Physical Therapy

## 2012-01-24 ENCOUNTER — Encounter: Payer: Self-pay | Admitting: Physical Therapy

## 2012-01-27 ENCOUNTER — Ambulatory Visit: Payer: BC Managed Care – PPO | Admitting: Physical Therapy

## 2012-02-21 ENCOUNTER — Encounter: Payer: Self-pay | Admitting: Sports Medicine

## 2012-02-21 ENCOUNTER — Ambulatory Visit (INDEPENDENT_AMBULATORY_CARE_PROVIDER_SITE_OTHER): Payer: BC Managed Care – PPO | Admitting: Sports Medicine

## 2012-02-21 ENCOUNTER — Telehealth: Payer: Self-pay | Admitting: *Deleted

## 2012-02-21 VITALS — BP 126/74 | HR 74

## 2012-02-21 DIAGNOSIS — M79604 Pain in right leg: Secondary | ICD-10-CM

## 2012-02-21 DIAGNOSIS — Z299 Encounter for prophylactic measures, unspecified: Secondary | ICD-10-CM

## 2012-02-21 DIAGNOSIS — M5412 Radiculopathy, cervical region: Secondary | ICD-10-CM

## 2012-02-21 DIAGNOSIS — Z0000 Encounter for general adult medical examination without abnormal findings: Secondary | ICD-10-CM | POA: Insufficient documentation

## 2012-02-21 DIAGNOSIS — M79609 Pain in unspecified limb: Secondary | ICD-10-CM

## 2012-02-21 LAB — RPR

## 2012-02-21 LAB — HIV ANTIBODY (ROUTINE TESTING W REFLEX): HIV: NONREACTIVE

## 2012-02-21 NOTE — Assessment & Plan Note (Signed)
Has failed physical therapy, steroids, oral medications. MRI lumbar spine.

## 2012-02-21 NOTE — Assessment & Plan Note (Signed)
Possible exposure. Urine GC and chlamydia screening. HIV, RPR, hepatitis B.

## 2012-02-21 NOTE — Addendum Note (Signed)
Addended by: Monica Becton on: 02/21/2012 10:26 AM   Modules accepted: Orders
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
## 2012-02-24 LAB — HSV(HERPES SMPLX)ABS-I+II(IGG+IGM)-BLD
HSV 1 Glycoprotein G Ab, IgG: <0.1 IV
HSV 2 Glycoprotein G Ab, IgG: 0.51 IV
Herpes Simplex Vrs I&II-IgM Ab (EIA): 0.7 INDEX

## 2012-02-24 LAB — GC/CHLAMYDIA PROBE AMP, URINE
Chlamydia, Swab/Urine, PCR: NEGATIVE
GC Probe Amp, Urine: NEGATIVE

## 2012-02-24 LAB — HEPATITIS PANEL, ACUTE
HCV Ab: NEGATIVE
Hep A IgM: NEGATIVE
Hep B C IgM: NEGATIVE
Hepatitis B Surface Ag: NEGATIVE

## 2012-02-25 ENCOUNTER — Ambulatory Visit (INDEPENDENT_AMBULATORY_CARE_PROVIDER_SITE_OTHER): Payer: BC Managed Care – PPO

## 2012-02-25 DIAGNOSIS — M79609 Pain in unspecified limb: Secondary | ICD-10-CM

## 2012-02-25 DIAGNOSIS — M79604 Pain in right leg: Secondary | ICD-10-CM

## 2012-02-25 DIAGNOSIS — M5412 Radiculopathy, cervical region: Secondary | ICD-10-CM

## 2012-02-29 ENCOUNTER — Other Ambulatory Visit: Payer: Self-pay

## 2012-03-04 ENCOUNTER — Ambulatory Visit (INDEPENDENT_AMBULATORY_CARE_PROVIDER_SITE_OTHER): Payer: BC Managed Care – PPO | Admitting: Sports Medicine

## 2012-03-04 DIAGNOSIS — M5412 Radiculopathy, cervical region: Secondary | ICD-10-CM

## 2012-03-04 DIAGNOSIS — M5416 Radiculopathy, lumbar region: Secondary | ICD-10-CM

## 2012-03-04 DIAGNOSIS — IMO0002 Reserved for concepts with insufficient information to code with codable children: Secondary | ICD-10-CM

## 2012-03-04 MED ORDER — GABAPENTIN 300 MG PO CAPS: ORAL_CAPSULE | ORAL | Status: DC

## 2012-03-04 NOTE — Assessment & Plan Note (Signed)
Current MRI does show right-sided L5-S1 disc protrusion, with possible foraminal stenosis. She has failed physical therapy, oral steroids, and I think it's time to pursue interventional treatment. At this point I'm going to order a right-sided interlaminar epidural steroid injection at the L5-S1 level.

## 2012-03-04 NOTE — Progress Notes (Signed)
  Subjective:    CC: Followup  HPI: Lumbar radiculitis: Present for a long time, MRI shows L5-S1 disc protrusion, on the right side. Her symptoms are suspicious for an L5 radiculitis.  She has failed conservative measures including formal physical therapy.  Cervical radiculitis: Right-sided C7, still present, and no better with current medications.  Past medical history, Surgical history, Family history not pertinant except as noted below, Social history, Allergies, and medications have been entered into the medical record, reviewed, and no changes needed.   Review of Systems: No headache, visual changes, nausea, vomiting, diarrhea, constipation, dizziness, abdominal pain, skin rash, fevers, chills, night sweats, weight loss, swollen lymph nodes, body aches, joint swelling, muscle aches, chest pain, shortness of breath, mood changes, visual or auditory hallucinations.   Objective:   General: Well Developed, well nourished, and in no acute distress.  Neuro/Psych: Alert and oriented x3, extra-ocular muscles intact, able to move all 4 extremities, sensation grossly intact. Skin: Warm and dry, no rashes noted.  Respiratory: Not using accessory muscles, speaking in full sentences, trachea midline.  Cardiovascular: Pulses palpable, no extremity edema. Abdomen: Does not appear distended.  I reviewed the MRI, C-spine MRI shows multilevel disc and degenerative changes with desiccation, but I do not see what appears to be any clinically significant foraminal stenosis. Regarding her lumbar spine MRI there is a significant degenerative disc protrusion at the L5-S1 level, with the disc predominantly bulging into the right-sided foramen, I think this may be clinically significant.  Impression and Recommendations:   This case required medical decision making of moderate complexity.

## 2012-03-04 NOTE — Assessment & Plan Note (Signed)
Symptoms are classic for right cervical radiculitis, I do think it's a C7 nerve root. MRI is not tremendously convincing. I would like to watch this for some more time, and try some gabapentin.  If still no better certainly we could consider a diagnostic injection.

## 2012-03-04 NOTE — Addendum Note (Signed)
Addended by: Chalmers Cater on: 03/04/2012 03:32 PM   Modules accepted: Orders

## 2012-03-05 ENCOUNTER — Inpatient Hospital Stay: Admission: RE | Admit: 2012-03-05 | Payer: Self-pay | Source: Ambulatory Visit

## 2012-03-06 ENCOUNTER — Ambulatory Visit
Admission: RE | Admit: 2012-03-06 | Discharge: 2012-03-06 | Disposition: A | Discharge status: Home / Self Care | Payer: BC Managed Care – PPO | Source: Ambulatory Visit | Attending: Sports Medicine | Admitting: Sports Medicine

## 2012-03-06 MED ORDER — IOHEXOL 180 MG/ML  SOLN: 1.0000 mL | Freq: Once | INTRAMUSCULAR | Status: AC | PRN

## 2012-03-06 MED ORDER — METHYLPREDNISOLONE ACETATE 40 MG/ML INJ SUSP (RADIOLOG: 120.0000 mg | Freq: Once | INTRAMUSCULAR | Status: DC

## 2012-03-09 ENCOUNTER — Telehealth: Payer: Self-pay

## 2012-03-09 NOTE — Telephone Encounter (Signed)
Patient called and wants to know if she is also getting injections in her cervical spine/

## 2012-03-09 NOTE — Telephone Encounter (Signed)
No we had decided to do some more conservative care on the c-spine first.  We may head to injections if gets no better.

## 2012-03-09 NOTE — Telephone Encounter (Signed)
LMOM of instructions and instructed to call back if questions. KG LPN

## 2012-06-26 ENCOUNTER — Encounter: Payer: Self-pay | Admitting: Sports Medicine

## 2012-06-26 ENCOUNTER — Ambulatory Visit (INDEPENDENT_AMBULATORY_CARE_PROVIDER_SITE_OTHER): Payer: BC Managed Care – PPO | Admitting: Sports Medicine

## 2012-06-26 ENCOUNTER — Other Ambulatory Visit (HOSPITAL_COMMUNITY)
Admission: RE | Admit: 2012-06-26 | Discharge: 2012-06-26 | Disposition: A | Discharge status: Home / Self Care | Payer: BC Managed Care – PPO | Source: Ambulatory Visit | Attending: Sports Medicine | Admitting: Sports Medicine

## 2012-06-26 VITALS — BP 100/64 | HR 85 | Wt 131.0 lb

## 2012-06-26 DIAGNOSIS — M5416 Radiculopathy, lumbar region: Secondary | ICD-10-CM

## 2012-06-26 DIAGNOSIS — Z0000 Encounter for general adult medical examination without abnormal findings: Secondary | ICD-10-CM

## 2012-06-26 DIAGNOSIS — Z124 Encounter for screening for malignant neoplasm of cervix: Secondary | ICD-10-CM

## 2012-06-26 DIAGNOSIS — Z113 Encounter for screening for infections with a predominantly sexual mode of transmission: Secondary | ICD-10-CM | POA: Insufficient documentation

## 2012-06-26 DIAGNOSIS — Z1151 Encounter for screening for human papillomavirus (HPV): Secondary | ICD-10-CM | POA: Insufficient documentation

## 2012-06-26 DIAGNOSIS — N760 Acute vaginitis: Secondary | ICD-10-CM | POA: Insufficient documentation

## 2012-06-26 DIAGNOSIS — R87810 Cervical high risk human papillomavirus (HPV) DNA test positive: Secondary | ICD-10-CM | POA: Insufficient documentation

## 2012-06-26 DIAGNOSIS — Z01419 Encounter for gynecological examination (general) (routine) without abnormal findings: Secondary | ICD-10-CM | POA: Insufficient documentation

## 2012-06-26 DIAGNOSIS — Z299 Encounter for prophylactic measures, unspecified: Secondary | ICD-10-CM

## 2012-06-26 MED ORDER — FLUCONAZOLE 150 MG PO TABS: 150.0000 mg | ORAL_TABLET | Freq: Once | ORAL | Status: DC

## 2012-06-26 MED ORDER — METRONIDAZOLE 500 MG PO TABS: 500.0000 mg | ORAL_TABLET | Freq: Two times a day (BID) | ORAL | Status: AC

## 2012-06-26 NOTE — Assessment & Plan Note (Addendum)
Routine physical exam performed today. Alejandra George will return next week to discuss birth control options. Return in one year.

## 2012-06-26 NOTE — Progress Notes (Signed)
  Subjective:    CC: Routine physical.  HPI:  Preventative measures: Pap smear was one year ago.   Lumbar radiculitis: Resolved for 2 weeks after interlaminar epidural steroid injection, is currently working with a Land.  Vaginal discharge: Had unprotected sex twice, now has vaginal discharge that is pruritic. No sores, no lesions. No abdominal pain, no fevers or chills. Discharge is watery, whitish.   Past medical history, Surgical history, Family history not pertinant except as noted below, Social history, Allergies, and medications have been entered into the medical record, reviewed, and no changes needed.   Review of Systems: No headache, visual changes, nausea, vomiting, diarrhea, constipation, dizziness, abdominal pain, skin rash, fevers, chills, night sweats, swollen lymph nodes, weight loss, chest pain, body aches, joint swelling, muscle aches, shortness of breath, mood changes, visual or auditory hallucinations.  Objective:    General: Well Developed, well nourished, and in no acute distress.  Neuro: Alert and oriented x3, extra-ocular muscles intact, sensation grossly intact.  HEENT: Normocephalic, atraumatic, pupils equal round reactive to light, neck supple, no masses, no lymphadenopathy, thyroid nonpalpable.  Skin: Warm and dry, no rashes noted.  Cardiac: Regular rate and rhythm, no murmurs rubs or gallops.  Respiratory: Clear to auscultation bilaterally. Not using accessory muscles, speaking in full sentences.  Abdominal: Soft, nontender, nondistended, positive bowel sounds, no masses, no organomegaly.  Musculoskeletal: Shoulder, elbow, wrist, hip, knee, ankle stable, and with full range of motion. Pelvic examination: Vulva is unremarkable without lesions, vagina is unremarkable without lesions, vault shows a whitish discharge, cervix is unremarkable without any discharge, or cervical motion tenderness. There are no adnexal masses or tenderness. Pap smear obtained as  well as gonorrhea/chlamydia, as well as wet prep were cells, yeast, Trichomonas.   Impression and Recommendations:    The patient was counselled, risk factors were discussed, anticipatory guidance given.

## 2012-06-26 NOTE — Assessment & Plan Note (Addendum)
Resolved for 2 weeks after right-sided L5-S1 intralaminar epidural steroid injection. I am going to give her some SI joint exercises.

## 2012-06-26 NOTE — Assessment & Plan Note (Signed)
Exam is more suggestive of candidal vaginitis. Her history is more suggestive of bacterial vaginosis. Wet prep obtained, gonorrhea/chlamydia testing, Pap smear also done. I would treat with Flagyl and Diflucan. Return as needed.

## 2012-06-27 DIAGNOSIS — Z0289 Encounter for other administrative examinations: Secondary | ICD-10-CM

## 2012-07-02 ENCOUNTER — Encounter: Payer: Self-pay | Admitting: Sports Medicine

## 2012-07-02 DIAGNOSIS — IMO0002 Reserved for concepts with insufficient information to code with codable children: Secondary | ICD-10-CM | POA: Insufficient documentation

## 2012-07-30 ENCOUNTER — Encounter: Payer: Self-pay | Admitting: Sports Medicine

## 2012-07-30 ENCOUNTER — Ambulatory Visit (INDEPENDENT_AMBULATORY_CARE_PROVIDER_SITE_OTHER): Payer: BC Managed Care – PPO | Admitting: Sports Medicine

## 2012-07-30 VITALS — BP 106/63 | HR 75 | Wt 132.0 lb

## 2012-07-30 DIAGNOSIS — Z3009 Encounter for other general counseling and advice on contraception: Secondary | ICD-10-CM

## 2012-07-30 DIAGNOSIS — M2550 Pain in unspecified joint: Secondary | ICD-10-CM | POA: Insufficient documentation

## 2012-07-30 LAB — CBC WITH DIFFERENTIAL/PLATELET
Basophils Absolute: 0.1 K/uL (ref 0.0–0.1)
Basophils Relative: 1 % (ref 0–1)
Eosinophils Absolute: 0.2 10*3/uL (ref 0.0–0.7)
Eosinophils Relative: 3 % (ref 0–5)
HCT: 37.1 % (ref 36.0–46.0)
Hemoglobin: 12.7 g/dL (ref 12.0–15.0)
Lymphocytes Relative: 30 % (ref 12–46)
Lymphs Abs: 1.8 10*3/uL (ref 0.7–4.0)
MCH: 28.8 pg (ref 26.0–34.0)
MCHC: 34.2 g/dL (ref 30.0–36.0)
MCV: 84.1 fL (ref 78.0–100.0)
Monocytes Absolute: 0.4 K/uL (ref 0.1–1.0)
Monocytes Relative: 7 % (ref 3–12)
Neutro Abs: 3.5 K/uL (ref 1.7–7.7)
Neutrophils Relative %: 59 % (ref 43–77)
Platelets: 267 K/uL (ref 150–400)
RBC: 4.41 MIL/uL (ref 3.87–5.11)
RDW: 13.7 % (ref 11.5–15.5)
WBC: 5.9 K/uL (ref 4.0–10.5)

## 2012-07-30 LAB — COMPREHENSIVE METABOLIC PANEL
BUN: 13 mg/dL (ref 6–23)
CO2: 27 mEq/L (ref 19–32)
Calcium: 9.5 mg/dL (ref 8.4–10.5)
Chloride: 104 mEq/L (ref 96–112)
Creat: 0.84 mg/dL (ref 0.50–1.10)
Glucose, Bld: 58 mg/dL — ABNORMAL LOW (ref 70–99)

## 2012-07-30 LAB — SEDIMENTATION RATE: Sed Rate: 1 mm/hr (ref 0–22)

## 2012-07-30 LAB — COMPREHENSIVE METABOLIC PANEL WITH GFR
ALT: 9 U/L (ref 0–35)
AST: 13 U/L (ref 0–37)
Albumin: 4.3 g/dL (ref 3.5–5.2)
Alkaline Phosphatase: 52 U/L (ref 39–117)
Potassium: 3.9 meq/L (ref 3.5–5.3)
Sodium: 139 meq/L (ref 135–145)
Total Bilirubin: 0.6 mg/dL (ref 0.3–1.2)
Total Protein: 7.4 g/dL (ref 6.0–8.3)

## 2012-07-30 LAB — URIC ACID: Uric Acid, Serum: 4.6 mg/dL (ref 2.4–7.0)

## 2012-07-30 LAB — RHEUMATOID FACTOR: Rheumatoid fact SerPl-aCnc: <10 [IU]/mL (ref <=14)

## 2012-07-30 LAB — CK: Total CK: 76 U/L (ref 7–177)

## 2012-07-30 MED ORDER — PREGABALIN 75 MG PO CAPS: 75.0000 mg | ORAL_CAPSULE | Freq: Two times a day (BID) | ORAL | Status: DC

## 2012-07-30 MED ORDER — NORELGESTROMIN-ETH ESTRADIOL 150-35 MCG/24HR TD PTWK: 1.0000 | MEDICATED_PATCH | TRANSDERMAL | Status: DC

## 2012-07-30 NOTE — Progress Notes (Signed)
  Subjective:    CC: Pain  HPI: Alejandra George is a very pleasant 22 year old female who been seeing for various musculoskeletal complaints. Eventually we did obtain cervical and lumbar spine MRI, cervical spine MRI was negative, lumbar spine MRI did show L5-S1 disc protrusions, she had an epidural injection that provided temporary relief. In the meantime she has been seeing a chiropractor who has noted multiple joint complaints and recommended MRI of multiple joints. She comes to see me today and tells me about various pains that she has in both sides of her neck, shoulders, arms, back, hips, and thighs. The pain tends to migrate around. She does have a history of depression. She has never been treated for myofascial type pain. Symptoms are mild, persistent.  Family planning: Has recently been on for just run only birth control, and has had multiple episodes of irregular bleeding, wondering if there is another type of birth control and will be easier, safer, and result in more regular menstruation period no pain.  Past medical history, Surgical history, Family history not pertinant except as noted below, Social history, Allergies, and medications have been entered into the medical record, reviewed, and no changes needed.   Review of Systems: No fevers, chills, night sweats, weight loss, chest pain, or shortness of breath.   Objective:    General: Well Developed, well nourished, and in no acute distress.  Neuro: Alert and oriented x3, extra-ocular muscles intact, sensation grossly intact.  HEENT: Normocephalic, atraumatic, pupils equal round reactive to light, neck supple, no masses, no lymphadenopathy, thyroid nonpalpable.  Skin: Warm and dry, no rashes. Cardiac: Regular rate and rhythm, no murmurs rubs or gallops, no lower extremity edema.  Respiratory: Clear to auscultation bilaterally. Not using accessory muscles, speaking in full sentences. Abdomen: Soft, nontender, nondistended, normal bowel  sounds, no palpable masses.  Impression and Recommendations:

## 2012-07-30 NOTE — Assessment & Plan Note (Signed)
She was told by her chiropractor that she needed MRIs of multiple joints. Multiple bilateral pain symptoms that migrate likely represents a myofascial pain syndrome. I do not see any evidence of synovitis on exam of multiple joints to suggest rheumatic etiology, I am going to obtain an entire rheumatic panel. I'm also going to start a Lyrica up taper. Samples given.

## 2012-07-30 NOTE — Patient Instructions (Addendum)
Myofascial Pain Syndrome  Myofascial pain syndrome is a pain disorder. This pain may be felt in the muscles. It may come and go. Myofascial pain syndrome always has trigger or tender points in the muscle that will cause pain when pressed.   CAUSES  Myofascial pain may be caused by injuries, especially auto accidents, or by overuse of certain muscles. Typically the pain is long lasting. It is made worse by overuse of the involved muscles, emotional distress, and by cold, damp weather. Myofascial pain syndrome often develops in patients whose response to stress is an increase in muscle tone, and is seen in greater frequency in patients with pre-existing tension headaches.  SYMPTOMS   Myofascial pain syndrome causes a wide variety of symptoms. You may see tight ropy bands of muscle. Problems may also include aching, cramping, burning, numbness, tingling, and other uncomfortable sensations in muscular areas. It most commonly affects the neck, upper back, and shoulder areas. Pain often radiates into the arms and hands.   TREATMENT  Treatment includes resting the affected muscular area and applying ice packs to reduce spasm and pain. Trigger point injection, is a valuable initial therapy. This therapy is an injection of local anesthetic directly into the trigger point. Trigger points are often present at the source of pain. Pain relief following injection confirms the diagnosis of myofascial pain syndrome. Fairly vigorous therapy can be carried out during the pain-free period after each injection. Stretching exercises to loosen up the muscles are also useful. Transcutaneous electrical nerve stimulation (TENS) may provide relief from pain. TENS is the use of electric current produced by a device to stimulate the nerves. Ultrasound therapy applied directly over the affected muscle may also provide pain relief. Anti-inflammatory pain medicine can be helpful. Symptoms will gradually improve over a period of weeks to months  with proper treatment.  HOME CARE INSTRUCTIONS  Call your caregiver for follow-up care as recommended.   SEEK MEDICAL CARE IF:   Your pain is severe and not helped with medications.  Document Released: 02/08/2004 Document Revised: 03/25/2011 Document Reviewed: 02/16/2010  ExitCare® Patient Information ©2014 [employer].

## 2012-07-30 NOTE — Assessment & Plan Note (Signed)
[place] birth control patches, she's having irregular bleeding on progesterone only birth control which is common.

## 2012-07-31 LAB — ANA: Anti Nuclear Antibody(ANA): NEGATIVE

## 2012-07-31 LAB — CYCLIC CITRUL PEPTIDE ANTIBODY, IGG: Cyclic Citrullin Peptide Ab: <2 U/mL (ref 0.0–5.0)

## 2012-08-06 ENCOUNTER — Encounter (HOSPITAL_COMMUNITY): Payer: Self-pay | Admitting: Emergency Medicine

## 2012-08-06 ENCOUNTER — Inpatient Hospital Stay (HOSPITAL_COMMUNITY)
Admission: AD | Admit: 2012-08-06 | Discharge: 2012-08-08 | DRG: 745 | Disposition: A | Discharge status: Home / Self Care | Payer: BC Managed Care – PPO | Source: Intra-hospital | Attending: Psychiatry | Admitting: Psychiatry

## 2012-08-06 ENCOUNTER — Emergency Department (HOSPITAL_COMMUNITY)
Admission: EM | Admit: 2012-08-06 | Discharge: 2012-08-06 | Disposition: A | Discharge status: Other Acute Inpatient Hospital | Payer: BC Managed Care – PPO | Attending: Emergency Medicine | Admitting: Emergency Medicine

## 2012-08-06 DIAGNOSIS — F11220 Opioid dependence with intoxication, uncomplicated: Secondary | ICD-10-CM

## 2012-08-06 DIAGNOSIS — J45909 Unspecified asthma, uncomplicated: Secondary | ICD-10-CM | POA: Diagnosis present

## 2012-08-06 DIAGNOSIS — F3289 Other specified depressive episodes: Secondary | ICD-10-CM

## 2012-08-06 DIAGNOSIS — F411 Generalized anxiety disorder: Secondary | ICD-10-CM | POA: Insufficient documentation

## 2012-08-06 DIAGNOSIS — F418 Other specified anxiety disorders: Secondary | ICD-10-CM | POA: Diagnosis present

## 2012-08-06 DIAGNOSIS — F329 Major depressive disorder, single episode, unspecified: Secondary | ICD-10-CM

## 2012-08-06 DIAGNOSIS — Z8619 Personal history of other infectious and parasitic diseases: Secondary | ICD-10-CM | POA: Insufficient documentation

## 2012-08-06 DIAGNOSIS — Z79899 Other long term (current) drug therapy: Secondary | ICD-10-CM

## 2012-08-06 DIAGNOSIS — F1910 Other psychoactive substance abuse, uncomplicated: Secondary | ICD-10-CM

## 2012-08-06 DIAGNOSIS — F909 Attention-deficit hyperactivity disorder, unspecified type: Secondary | ICD-10-CM | POA: Diagnosis present

## 2012-08-06 DIAGNOSIS — Z3202 Encounter for pregnancy test, result negative: Secondary | ICD-10-CM | POA: Insufficient documentation

## 2012-08-06 DIAGNOSIS — F1110 Opioid abuse, uncomplicated: Secondary | ICD-10-CM | POA: Insufficient documentation

## 2012-08-06 DIAGNOSIS — F1010 Alcohol abuse, uncomplicated: Secondary | ICD-10-CM | POA: Insufficient documentation

## 2012-08-06 DIAGNOSIS — F1920 Other psychoactive substance dependence, uncomplicated: Principal | ICD-10-CM | POA: Diagnosis present

## 2012-08-06 DIAGNOSIS — F1120 Opioid dependence, uncomplicated: Secondary | ICD-10-CM | POA: Diagnosis present

## 2012-08-06 DIAGNOSIS — Z880 Allergy status to penicillin: Secondary | ICD-10-CM | POA: Insufficient documentation

## 2012-08-06 LAB — COMPREHENSIVE METABOLIC PANEL
ALT: 15 U/L (ref 0–35)
AST: 17 U/L (ref 0–37)
CO2: 30 mEq/L (ref 19–32)
Chloride: 98 mEq/L (ref 96–112)
Creatinine, Ser: 0.86 mg/dL (ref 0.50–1.10)
GFR calc non Af Amer: >90 mL/min (ref >90)
Total Bilirubin: 0.4 mg/dL (ref 0.3–1.2)

## 2012-08-06 LAB — RAPID URINE DRUG SCREEN, HOSP PERFORMED
Amphetamines: NOT DETECTED
Tetrahydrocannabinol: NOT DETECTED

## 2012-08-06 LAB — CBC
MCV: 87 fL (ref 78.0–100.0)
Platelets: 289 10*3/uL (ref 150–400)
RBC: 4.53 MIL/uL (ref 3.87–5.11)
WBC: 5.4 10*3/uL (ref 4.0–10.5)

## 2012-08-06 LAB — POCT PREGNANCY, URINE: Preg Test, Ur: NEGATIVE

## 2012-08-06 LAB — SALICYLATE LEVEL: Salicylate Lvl: <2 mg/dL — ABNORMAL LOW (ref 2.8–20.0)

## 2012-08-06 MED ORDER — ACETAMINOPHEN 325 MG PO TABS: 650.0000 mg | ORAL_TABLET | Freq: Four times a day (QID) | ORAL | Status: DC | PRN

## 2012-08-06 MED ORDER — HYDROXYZINE HCL 25 MG PO TABS
25.0000 mg | ORAL_TABLET | Freq: Four times a day (QID) | ORAL | Status: DC | PRN
  Administered 2012-08-07 (×2): 25 mg via ORAL
  Filled 2012-08-06 (×2): qty 1

## 2012-08-06 MED ORDER — CLONIDINE HCL 0.1 MG PO TABS: 0.1000 mg | ORAL_TABLET | Freq: Every day | ORAL | Status: DC

## 2012-08-06 MED ORDER — SERTRALINE HCL 50 MG PO TABS
50.0000 mg | ORAL_TABLET | Freq: Every day | ORAL | Status: DC
  Administered 2012-08-07 – 2012-08-08 (×2): 50 mg via ORAL
  Filled 2012-08-06 (×3): qty 1

## 2012-08-06 MED ORDER — ONDANSETRON 4 MG PO TBDP: 4.0000 mg | ORAL_TABLET | Freq: Four times a day (QID) | ORAL | Status: DC | PRN

## 2012-08-06 MED ORDER — ALUM & MAG HYDROXIDE-SIMETH 200-200-20 MG/5ML PO SUSP: 30.0000 mL | ORAL | Status: DC | PRN

## 2012-08-06 MED ORDER — NAPROXEN 500 MG PO TABS
500.0000 mg | ORAL_TABLET | Freq: Two times a day (BID) | ORAL | Status: DC | PRN
  Administered 2012-08-07: 500 mg via ORAL
  Filled 2012-08-06: qty 1

## 2012-08-06 MED ORDER — CHLORDIAZEPOXIDE HCL 25 MG PO CAPS
25.0000 mg | ORAL_CAPSULE | Freq: Four times a day (QID) | ORAL | Status: DC | PRN
  Administered 2012-08-07 – 2012-08-08 (×4): 25 mg via ORAL
  Filled 2012-08-06 (×4): qty 1

## 2012-08-06 MED ORDER — MAGNESIUM HYDROXIDE 400 MG/5ML PO SUSP: 30.0000 mL | Freq: Every day | ORAL | Status: DC | PRN

## 2012-08-06 MED ORDER — LOPERAMIDE HCL 2 MG PO CAPS: 2.0000 mg | ORAL_CAPSULE | ORAL | Status: DC | PRN

## 2012-08-06 MED ORDER — METHOCARBAMOL 500 MG PO TABS
500.0000 mg | ORAL_TABLET | Freq: Three times a day (TID) | ORAL | Status: DC | PRN
  Administered 2012-08-07 (×2): 500 mg via ORAL
  Filled 2012-08-06 (×2): qty 1

## 2012-08-06 MED ORDER — CLONIDINE HCL 0.1 MG PO TABS
0.1000 mg | ORAL_TABLET | Freq: Four times a day (QID) | ORAL | Status: DC
  Administered 2012-08-07: 0.1 mg via ORAL
  Filled 2012-08-06 (×11): qty 1

## 2012-08-06 MED ORDER — CLONIDINE HCL 0.1 MG PO TABS: 0.1000 mg | ORAL_TABLET | ORAL | Status: DC

## 2012-08-06 MED ORDER — NICOTINE 21 MG/24HR TD PT24
21.0000 mg | MEDICATED_PATCH | Freq: Every day | TRANSDERMAL | Status: DC
  Filled 2012-08-06: qty 1

## 2012-08-06 MED ORDER — ALBUTEROL SULFATE HFA 108 (90 BASE) MCG/ACT IN AERS
1.0000 | INHALATION_SPRAY | Freq: Four times a day (QID) | RESPIRATORY_TRACT | Status: DC | PRN
Start: 2012-08-06 — End: 2012-08-08

## 2012-08-06 MED ORDER — DICYCLOMINE HCL 20 MG PO TABS: 20.0000 mg | ORAL_TABLET | Freq: Four times a day (QID) | ORAL | Status: DC | PRN
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
## 2012-08-06 NOTE — ED Notes (Signed)
Pt reports "I need detox" Pt reports to using heroine 30 minutes ago around 1530.

## 2012-08-06 NOTE — ED Notes (Signed)
Patient states she also used crack cocaine last night. Patient unable to remember how much she used, states that it was "as much as you can get with twenty dollars."

## 2012-08-06 NOTE — ED Notes (Signed)
Pt cell phone still with her when brought back.  Requested the phone and allowed her to write down needed numbers to call from land line.  Placed cell phone in belongs bag that is located under nurses station across from room 12

## 2012-08-06 NOTE — ED Provider Notes (Signed)
Pt alert , pleasant cooperative, ammbulates without difficulty. Accepted by dr Dub Mikes. Results for orders placed during the hospital encounter of 08/06/12  ACETAMINOPHEN LEVEL      Result Value Range   Acetaminophen (Tylenol), Serum <15.0  10 - 30 ug/mL  CBC      Result Value Range   WBC 5.4  4.0 - 10.5 K/uL   RBC 4.53  3.87 - 5.11 MIL/uL   Hemoglobin 13.1  12.0 - 15.0 g/dL   HCT 16.1  09.6 - 04.5 %   MCV 87.0  78.0 - 100.0 fL   MCH 28.9  26.0 - 34.0 pg   MCHC 33.2  30.0 - 36.0 g/dL   RDW 40.9  81.1 - 91.4 %   Platelets 289  150 - 400 K/uL  COMPREHENSIVE METABOLIC PANEL      Result Value Range   Sodium 136  135 - 145 mEq/L   Potassium 3.5  3.5 - 5.1 mEq/L   Chloride 98  96 - 112 mEq/L   CO2 30  19 - 32 mEq/L   Glucose, Bld 102 (*) 70 - 99 mg/dL   BUN 7  6 - 23 mg/dL   Creatinine, Ser 7.82  0.50 - 1.10 mg/dL   Calcium 9.5  8.4 - 95.6 mg/dL   Total Protein 7.9  6.0 - 8.3 g/dL   Albumin 4.0  3.5 - 5.2 g/dL   AST 17  0 - 37 U/L   ALT 15  0 - 35 U/L   Alkaline Phosphatase 64  39 - 117 U/L   Total Bilirubin 0.4  0.3 - 1.2 mg/dL   GFR calc non Af Amer >90  >90 mL/min   GFR calc Af Amer >90  >90 mL/min  ETHANOL      Result Value Range   Alcohol, Ethyl (B) <11  0 - 11 mg/dL  SALICYLATE LEVEL      Result Value Range   Salicylate Lvl <2.0 (*) 2.8 - 20.0 mg/dL  URINE RAPID DRUG SCREEN (HOSP PERFORMED)      Result Value Range   Opiates POSITIVE (*) NONE DETECTED   Cocaine POSITIVE (*) NONE DETECTED   Benzodiazepines NONE DETECTED  NONE DETECTED   Amphetamines NONE DETECTED  NONE DETECTED   Tetrahydrocannabinol NONE DETECTED  NONE DETECTED   Barbiturates NONE DETECTED  NONE DETECTED  POCT PREGNANCY, URINE      Result Value Range   Preg Test, Ur NEGATIVE  NEGATIVE   No results found.   Doug Sou, MD 08/06/12 2108

## 2012-08-06 NOTE — ED Provider Notes (Signed)
CSN: [id number]     Arrival date & time 08/06/12  1529 History     First MD Initiated Contact with Patient 08/06/12 1630     Chief Complaint  Patient presents with  . Medical Clearance   (Consider location/radiation/quality/duration/timing/severity/associated sxs/prior Treatment) HPI Comments: Patient is a 22 year old female with a past medical history of depression, anxiety and polysubstance abuse who presents requesting detox from multiple substances including heroin, klonopin, adderral, and wine. Patient reports a few month history of being sober from these substances but relapsed last week. She reports using each of the substances daily for the past week. Her last use of heroin was today. Her last use of klonopin, adderral, and wine was last night. She denies SI/HI.    Past Medical History  Diagnosis Date  . Asthma   . Depression   . Anxiety   . History of chicken pox     as a child  . History of polydrug abuse    Past Surgical History  Procedure Laterality Date  . No past surgeries    . Cesarean section  07/24/2011    Procedure: CESAREAN SECTION;  Surgeon: Mickel Baas, MD;  Location: [facility];  Service: Gynecology;  Laterality: N/A;   Family History  Problem Relation Age of Onset  . Diabetes Mother   . Hypertension Mother   . Hypertension Maternal Grandmother   . Diabetes Maternal Grandmother   . Cancer Maternal Grandmother     Breast  . Cancer Maternal Grandfather     Lung   History  Substance Use Topics  . Smoking status: Never Smoker   . Smokeless tobacco: Never Used  . Alcohol Use: No   OB History   Grav Para Term Preterm Abortions TAB SAB Ect Mult Living   1 1 1  0 0 0 0 0 0 1     Review of Systems  Psychiatric/Behavioral:       Polysubstance abuse.  All other systems reviewed and are negative.    Allergies  Penicillins and Cephalosporins  Home Medications   Current Outpatient Rx  Name  Route  Sig  Dispense  Refill  . albuterol (PROVENTIL  HFA;VENTOLIN HFA) 108 (90 BASE) MCG/ACT inhaler   Inhalation   Inhale 2 puffs into the lungs every 4 (four) hours as needed for wheezing or shortness of breath.          . sertraline (ZOLOFT) 50 MG tablet   Oral   Take 50 mg by mouth daily.         . norelgestromin-ethinyl estradiol (ORTHO EVRA) 150-35 MCG/24HR transdermal patch   Transdermal   Place 1 patch onto the skin once a week. for 3 weeks then remove for one week.   3 patch   12   . pregabalin (LYRICA) 75 MG capsule   Oral   Take 1 capsule (75 mg total) by mouth 2 (two) times daily.   28 capsule   3    BP 107/76  Pulse 92  Temp(Src) 98.1 F (36.7 C) (Oral)  Resp 16  SpO2 98% Physical Exam  Nursing note and vitals reviewed. Constitutional: She is oriented to person, place, and time. She appears well-developed and well-nourished. No distress.  HENT:  Head: Normocephalic and atraumatic.  Eyes: Conjunctivae and EOM are normal.  Neck: Normal range of motion.  Cardiovascular: Normal rate and regular rhythm.  Exam reveals no gallop and no friction rub.   No murmur heard. Pulmonary/Chest: Effort normal and breath sounds  normal. She has no wheezes. She has no rales. She exhibits no tenderness.  Abdominal: Soft. She exhibits no distension. There is no tenderness. There is no rebound and no guarding.  Musculoskeletal: Normal range of motion.  Neurological: She is alert and oriented to person, place, and time. Coordination normal.  Slow speech. Moves limbs without ataxia.   Skin: Skin is warm and dry.  Psychiatric:  Flat affect.    ED Course   Procedures (including critical care time)  Labs Reviewed  COMPREHENSIVE METABOLIC PANEL - Abnormal; Notable for the following:    Glucose, Bld 102 (*)    All other components within normal limits  SALICYLATE LEVEL - Abnormal; Notable for the following:    Salicylate Lvl <2.0 (*)    All other components within normal limits  URINE RAPID DRUG SCREEN (HOSP PERFORMED) -  Abnormal; Notable for the following:    Opiates POSITIVE (*)    Cocaine POSITIVE (*)    All other components within normal limits  ACETAMINOPHEN LEVEL  CBC  ETHANOL  POCT PREGNANCY, URINE   No results found.  1. Polysubstance abuse     MDM  5:11 PM Labs pending. I spoke with ACT who will talk to the patient about possible facility placement.   6:56 PM ACT team spoke with the patient about resources for detox and rehab. Patient will be discharged with resource list. Patient is agreeable to this plan. Patient instructed to return with worsening or concerning symptoms.   7:12 PM Inpatient facility will accept the patient. Patient no longer discharged and will be transferred.  Emilia Beck, PA-C 08/06/12 1859   Emilia Beck, PA-C 08/06/12 1913

## 2012-08-06 NOTE — ED Notes (Signed)
Belongings handed off to Grand[place], Charity fundraiser at [facility] ED.

## 2012-08-06 NOTE — ED Provider Notes (Signed)
Medical screening examination/treatment/procedure(s) were performed by non-physician practitioner and as supervising physician I was immediately available for consultation/collaboration.  Flint Melter, MD 08/06/12 [phone]

## 2012-08-06 NOTE — BHH Counselor (Signed)
Pt accepted to [facility] for inpt admission.  Dr. Dub Mikes, attending, 305-1

## 2012-08-07 ENCOUNTER — Encounter (HOSPITAL_COMMUNITY): Payer: Self-pay

## 2012-08-07 DIAGNOSIS — F1910 Other psychoactive substance abuse, uncomplicated: Secondary | ICD-10-CM

## 2012-08-07 DIAGNOSIS — F329 Major depressive disorder, single episode, unspecified: Secondary | ICD-10-CM

## 2012-08-07 DIAGNOSIS — F1120 Opioid dependence, uncomplicated: Secondary | ICD-10-CM | POA: Diagnosis present

## 2012-08-07 DIAGNOSIS — F411 Generalized anxiety disorder: Secondary | ICD-10-CM | POA: Diagnosis present

## 2012-08-07 DIAGNOSIS — F1920 Other psychoactive substance dependence, uncomplicated: Principal | ICD-10-CM

## 2012-08-07 DIAGNOSIS — F418 Other specified anxiety disorders: Secondary | ICD-10-CM | POA: Diagnosis present

## 2012-08-07 DIAGNOSIS — F909 Attention-deficit hyperactivity disorder, unspecified type: Secondary | ICD-10-CM

## 2012-08-07 NOTE — Progress Notes (Signed)
[facility] Adult Case Management Discharge Plan :  Will you be returning to the same living situation after discharge: Yes,  home with parents At discharge, do you have transportation home?:Yes,  she has her car in [facility] parking lot. Will drive herself home Do you have the ability to pay for your medications:Yes,  private insurance [employer]  Release of information consent forms completed and in the chart;  Patient's signature needed at discharge.  Patient to Follow up at: Follow-up Information   Follow up with [facility] On 08/10/2012. (Appt. at 12:00PM for hospital follow-up and assessment for [facility] IOP.)    Contact information:   [address] [place], [place] [address] phone: [phone] fax: [phone]      Patient denies SI/HI:   Yes,  during group/self report    Safety Planning and Suicide Prevention discussed:  Yes,  Voicemail left with pt's mother. SPE completed with pt. She was given SPI pamphlet and encouraged to ask questions/talk about concerns regarding SPE. Mobile crisis number also given to pt.   Smart, Reighlynn Swiney 08/07/2012, 2:29 PM
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
## 2012-08-07 NOTE — [facility] Group Notes (Signed)
[facility] LCSW Group Therapy  08/07/2012 4:59 PM  Type of Therapy:  Group Therapy  Participation Level:  Active  Participation Quality:  Attentive  Affect:  Depressed  Cognitive:  Alert  Insight:  Improving  Engagement in Therapy:  Engaged  Modes of Intervention:  Confrontation, Discussion, Education, Exploration, Socialization and Support  Summary of Progress/Problems: Feelings around Relapse. Group members discussed the meaning of relapse and shared personal stories of relapse, how it affected them and others, and how they perceived themselves during this time. Group members were encouraged to identify triggers, warning signs and coping skills used when facing the possibility of relapse. Social supports were discussed and explored in detail. Post Acute Withdrawal Syndrome (handout provided) was introduced and examined. Pt's were encouraged to ask questions, talk about key points associated with PAWS, and process this information in terms of relapse prevention. Melana shared that she relapsed on heroin after being clean for 15 months. She feels embarrassed that she took back the father of her child and gave in to his pressure to try heroin but feels proud of herself for taking steps to get help. Kirstan shared that she wants to attend SA IOP and plans to [facility] so she can be in a healthy, supportive environment, have her child, learn how to be a better mother, and stay clean. An shows progress in the group setting by participating without needing to be directly called upon and exploring how groups will help her develop a supportive network when she leaves [facility].    Smart, Nitish Roes 08/07/2012, 4:59 PM

## 2012-08-07 NOTE — [facility] Suicide Risk Assessment (Signed)
[facility] INPATIENT:  Family/Significant Other Suicide Prevention Education  Suicide Prevention Education:  Contact Attempts: Alejandra George (patient's mother) has been identified by the patient as the family member/significant other with whom the patient will be residing, and identified as the person(s) who will aid the patient in the event of a mental health crisis.  With written consent from the patient, two attempts were made to provide suicide prevention education, prior to and/or following the patient's discharge.  We were unsuccessful in providing suicide prevention education.  A suicide education pamphlet was given to the patient to share with family/significant other.  Date and time of first attempt:2:00PM 08/07/12 Date and time of second attempt:2:20PM 08/07/12 Left voicemail   Smart, Lidya Mccalister 08/07/2012, 2:27 PM

## 2012-08-07 NOTE — [facility] Group Notes (Signed)
[facility] LCSW Aftercare Discharge Planning Group Note   08/07/2012 9:33 AM  Participation Quality:  DID NOT ATTEND    Smart, Alejandra George

## 2012-08-07 NOTE — Progress Notes (Signed)
Pt is a 22 year old female admitted with polysubstance abuse   She uses opiates including heroine and crack cocaine  Occasional use of etoh  She also uses vicodin percocet and clonazepam  She has been taking adderall and clonazepam prescribed to someone else  She has a 53 year old baby whom her parents have threatened to seek custody of if she doesn't get clean   She has prior suicide attempts in her early teens but denies same now   She has asthma anxiety and depression  History   She reports she may not go back home to live with her parents and would like help to get into [facility]    She is cooperative and is having some anxiety and is fidgety during the assessment   She reports feeling achy all over   Pt was oriented to the unit  She was given verbal support   Medications administered and effectiveness monitored   Nourishment provided  Pt is safe and adjusting well

## 2012-08-07 NOTE — Tx Team (Signed)
Initial Interdisciplinary Treatment Plan  PATIENT STRENGTHS: (choose at least two) Average or above average intelligence Capable of independent living General fund of knowledge Physical Health Supportive family/friends  PATIENT STRESSORS: Marital or family conflict Medication change or noncompliance Substance abuse   PROBLEM LIST: Problem List/Patient Goals Date to be addressed Date deferred Reason deferred Estimated date of resolution  Polysubstance dependence                                                       DISCHARGE CRITERIA:  Ability to meet basic life and health needs Improved stabilization in mood, thinking, and/or behavior Motivation to continue treatment in a less acute level of care Withdrawal symptoms are absent or subacute and managed without 24-hour nursing intervention  PRELIMINARY DISCHARGE PLAN: Attend 12-step recovery group Outpatient therapy Placement in alternative living arrangements  PATIENT/FAMIILY INVOLVEMENT: This treatment plan has been presented to and reviewed with the patient, Alejandra George, and/or family member, .  The patient and family have been given the opportunity to ask questions and make suggestions.  [name] 08/07/2012, 1:04 AM

## 2012-08-07 NOTE — Progress Notes (Signed)
D.  Pt pleasant on approach, some anxiety noted, denies other complaints at this time.  Pt has been interacting appropriately within milieu.  Pt denies SI/HI/hallucinations at present.  Positive for evening AA group.  A.  Support and encouragement offered, medication given as ordered for withdrawal symptoms.  R.  Pt remains safe on unit, will continue to monitor.

## 2012-08-07 NOTE — [facility] Suicide Risk Assessment (Signed)
Suicide Risk Assessment  Admission Assessment     Nursing information obtained from:    Demographic factors:    Current Mental Status:    Loss Factors:    Historical Factors:    Risk Reduction Factors:     CLINICAL FACTORS:   Severe Anxiety and/or Agitation Depression:   Comorbid alcohol abuse/dependence Alcohol/Substance Abuse/Dependencies  COGNITIVE FEATURES THAT CONTRIBUTE TO RISK:  Closed-mindedness Polarized thinking Thought constriction (tunnel vision)    SUICIDE RISK:   Moderate:  Frequent suicidal ideation with limited intensity, and duration, some specificity in terms of plans, no associated intent, good self-control, limited dysphoria/symptomatology, some risk factors present, and identifiable protective factors, including available and accessible social support.  PLAN OF CARE: Supportive approach/coping skills/relapse prevention                               Reassess and address the co morbidities  I certify that inpatient services furnished can reasonably be expected to improve the patient's condition.  Alejandra George A 08/07/2012, 1:33 PM

## 2012-08-07 NOTE — Progress Notes (Signed)
Nursing Progress Notes. D. Patient presents with anxious, depressed mood. She states ''well I'm doing better today than yesterday, because I slept a little better but I'm still really tired'' Patient remains on COWS protocol. Patients blood pressure noted to be hypotensive, encouraged hydration and also held scheduled clonidine, notified Dr. Dub Mikes of above information. Patient has attended unit programming, been visible in the milieu. A. Medications given, support and encouragement provided. R. Patient currently calm and cooperative. Will continue to monitor q 15 minutes for safety.
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
## 2012-08-08 MED ORDER — SERTRALINE HCL 50 MG PO TABS: 50.0000 mg | ORAL_TABLET | Freq: Every day | ORAL | Status: DC
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
## 2012-08-08 NOTE — Progress Notes (Signed)
Adult Psychoeducational Group Note  Date:  08/08/2012 Time:  9:36 AM  Group Topic/Focus:  Therapeutic Activity  Participation Level:  Did Not Attend   Dalia Heading 08/08/2012, 9:36 AM

## 2012-08-08 NOTE — Progress Notes (Signed)
Adult Psychoeducational Group Note  Date:  08/08/2012 Time:  9:37 AM  Group Topic/Focus:  Relapse Prevention Planning:   The focus of this group is to define relapse and discuss the need for planning to combat relapse.  Participation Level:  Did Not Attend  Dalia Heading 08/08/2012, 9:37 AM

## 2012-08-08 NOTE — [facility] Group Notes (Signed)
[facility] LCSW Group Therapy  08/08/2012   10:00 AM   Type of Therapy:  Group Therapy  Participation Level:  Did Not Attend  Charmaine Placido Horton, LCSWA 08/08/2012 9:47 AM    

## 2012-08-08 NOTE — [facility] Group Notes (Signed)
[facility] Group Notes:  (Nursing/MHT/Case Management/Adjunct)  Date:  08/08/2012  Time:  10:18 AM  Type of Therapy:  patient self inventory review with RN   Participation Level:  Active  Participation Quality:  Appropriate  Affect:  Appropriate  Cognitive:  Alert  Insight:  Appropriate  Engagement in Group:  Engaged  Modes of Intervention:  Discussion and Education  Summary of Progress/Problems: patient self inventory review with RN discussed mood, and healthy coping skills. Malva Limes 08/08/2012, 10:18 AM

## 2012-08-08 NOTE — Discharge Summary (Signed)
Physician Discharge Summary Note  Patient:  Alejandra George is an 22 y.o., female MRN:  [id number] DOB:  1990/02/19 Patient phone:  [phone] (home)  Patient address:   [address] [place] [address],   Date of Admission:  08/06/2012 Date of Discharge:  08/08/2012  Reason for Admission:  Opioid detox   Discharge Diagnoses: Active Problems:   Polysubstance dependence including opioid type drug, episodic abuse   Depressive disorder, not elsewhere classified   Anxiety state, unspecified  Review of Systems  Constitutional: Negative.  Negative for fever, chills, weight loss, malaise/fatigue and diaphoresis.  HENT: Negative for congestion and sore throat.   Eyes: Negative for blurred vision, double vision and photophobia.  Respiratory: Negative for cough, shortness of breath and wheezing.   Cardiovascular: Negative for chest pain, palpitations and PND.  Gastrointestinal: Negative for heartburn, nausea, vomiting, abdominal pain, diarrhea and constipation.  Musculoskeletal: Negative for myalgias, joint pain and falls.  Neurological: Negative for dizziness, tingling, tremors, sensory change, speech change, focal weakness, seizures, loss of consciousness, weakness and headaches.  Endo/Heme/Allergies: Negative for polydipsia. Does not bruise/bleed easily.  Psychiatric/Behavioral: Negative for depression, suicidal ideas, hallucinations, memory loss and substance abuse. The patient is not nervous/anxious and does not have insomnia.   Assessment:  AXIS I: Polysubstance Abuse/Dependence, Including Opioids, Depressive Disorder NOS, ADHD  AXIS II: Deferred  AXIS III:  Past Medical History   Diagnosis  Date   .  Asthma    .  Depression    .  Anxiety    .  History of chicken pox      as a child   .  History of polydrug abuse     AXIS IV: other psychosocial or environmental problems  AXIS V: 51-60 moderate symptoms  Level of Care:  OP  Hospital Course:   [name] was admitted for  relapse on crack, adderall, heroine and crisis management.  She was treated with the standard protocol for detox.  Medical problems were identified and treated.  Home medications was restarted as appropriate.  Improvement was monitored by CIWA/COWS scores and patient's daily report of withdrawal symptom reduction.       Emotional and mental status was monitored by daily self inventory reports completed by the patient and clinical staff.  The patient was evaluated by the treatment team for stability and plans for continued recovery upon discharge. She was offered further treatment options upon discharge including Residential, Intensive Outpatient and Outpatient treatment.  The patient's motivation was an integral factor for scheduling further treatment.  Employment, transportation, bed availability, health status, family support, and any pending legal issues were also considered.  Upon completion of detox the patient was both and mentally and medically stable for discharge.   Consults:  None  Significant Diagnostic Studies:  None  Discharge Vitals:   Blood pressure 90/60, pulse 80, temperature 97.9 F (36.6 C), temperature source Oral, resp. rate 16, height 5\' 5"  (1.651 m), weight 58.06 kg (128 lb). Body mass index is 21.3 kg/(m^2). Lab Results:   Results for orders placed during the hospital encounter of 08/06/12 (from the past 72 hour(s))  URINE RAPID DRUG SCREEN (HOSP PERFORMED)     Status: Abnormal   Collection Time    08/06/12  4:45 PM      Result Value Range   Opiates POSITIVE (*) NONE DETECTED   Cocaine POSITIVE (*) NONE DETECTED   Benzodiazepines NONE DETECTED  NONE DETECTED   Amphetamines NONE DETECTED  NONE DETECTED   Tetrahydrocannabinol  NONE DETECTED  NONE DETECTED   Barbiturates NONE DETECTED  NONE DETECTED   Comment:            DRUG SCREEN FOR MEDICAL PURPOSES     ONLY.  IF CONFIRMATION IS NEEDED     FOR ANY PURPOSE, NOTIFY LAB     WITHIN 5 DAYS.                LOWEST  DETECTABLE LIMITS     FOR URINE DRUG SCREEN     Drug Class       Cutoff (ng/mL)     Amphetamine      1000     Barbiturate      200     Benzodiazepine   200     Tricyclics       300     Opiates          300     Cocaine          300     THC              50  POCT PREGNANCY, URINE     Status: None   Collection Time    08/06/12  4:52 PM      Result Value Range   Preg Test, Ur NEGATIVE  NEGATIVE   Comment:            THE SENSITIVITY OF THIS     METHODOLOGY IS >24 mIU/mL  ACETAMINOPHEN LEVEL     Status: None   Collection Time    08/06/12  5:33 PM      Result Value Range   Acetaminophen (Tylenol), Serum <15.0  10 - 30 ug/mL   Comment:            THERAPEUTIC CONCENTRATIONS VARY     SIGNIFICANTLY. A RANGE OF 10-30     ug/mL MAY BE AN EFFECTIVE     CONCENTRATION FOR MANY PATIENTS.     HOWEVER, SOME ARE BEST TREATED     AT CONCENTRATIONS OUTSIDE THIS     RANGE.     ACETAMINOPHEN CONCENTRATIONS     >150 ug/mL AT 4 HOURS AFTER     INGESTION AND >50 ug/mL AT 12     HOURS AFTER INGESTION ARE     OFTEN ASSOCIATED WITH TOXIC     REACTIONS.  CBC     Status: None   Collection Time    08/06/12  5:33 PM      Result Value Range   WBC 5.4  4.0 - 10.5 K/uL   RBC 4.53  3.87 - 5.11 MIL/uL   Hemoglobin 13.1  12.0 - 15.0 g/dL   HCT 96.0  45.4 - 09.8 %   MCV 87.0  78.0 - 100.0 fL   MCH 28.9  26.0 - 34.0 pg   MCHC 33.2  30.0 - 36.0 g/dL   RDW 11.9  14.7 - 82.9 %   Platelets 289  150 - 400 K/uL  COMPREHENSIVE METABOLIC PANEL     Status: Abnormal   Collection Time    08/06/12  5:33 PM      Result Value Range   Sodium 136  135 - 145 mEq/L   Potassium 3.5  3.5 - 5.1 mEq/L   Chloride 98  96 - 112 mEq/L   CO2 30  19 - 32 mEq/L   Glucose, Bld 102 (*) 70 - 99 mg/dL   BUN 7  6 - 23 mg/dL   Creatinine, Ser  0.86  0.50 - 1.10 mg/dL   Calcium 9.5  8.4 - 40.9 mg/dL   Total Protein 7.9  6.0 - 8.3 g/dL   Albumin 4.0  3.5 - 5.2 g/dL   AST 17  0 - 37 U/L   ALT 15  0 - 35 U/L   Alkaline Phosphatase  64  39 - 117 U/L   Total Bilirubin 0.4  0.3 - 1.2 mg/dL   GFR calc non Af Amer >90  >90 mL/min   GFR calc Af Amer >90  >90 mL/min   Comment:            The eGFR has been calculated     using the CKD EPI equation.     This calculation has not been     validated in all clinical     situations.     eGFR's persistently     <90 mL/min signify     possible Chronic Kidney Disease.  ETHANOL     Status: None   Collection Time    08/06/12  5:33 PM      Result Value Range   Alcohol, Ethyl (B) <11  0 - 11 mg/dL   Comment:            LOWEST DETECTABLE LIMIT FOR     SERUM ALCOHOL IS 11 mg/dL     FOR MEDICAL PURPOSES ONLY  SALICYLATE LEVEL     Status: Abnormal   Collection Time    08/06/12  5:33 PM      Result Value Range   Salicylate Lvl <2.0 (*) 2.8 - 20.0 mg/dL    Physical Findings: AIMS: Facial and Oral Movements Muscles of Facial Expression: None, normal Lips and Perioral Area: None, normal Jaw: None, normal Tongue: None, normal,Extremity Movements Upper (arms, wrists, hands, fingers): None, normal Lower (legs, knees, ankles, toes): None, normal, Trunk Movements Neck, shoulders, hips: None, normal, Overall Severity Severity of abnormal movements (highest score from questions above): None, normal Incapacitation due to abnormal movements: None, normal Patient's awareness of abnormal movements (rate only patient's report): No Awareness, Dental Status Current problems with teeth and/or dentures?: No Does patient usually wear dentures?: No  CIWA:  CIWA-Ar Total: 2 COWS:  COWS Total Score: 3  Psychiatric Specialty Exam: See Psychiatric Specialty Exam and Suicide Risk Assessment completed by Attending Physician prior to discharge.  Discharge destination:  Home  Is patient on multiple antipsychotic therapies at discharge:  No   Has Patient had three or more failed trials of antipsychotic monotherapy by history:  No  Recommended Plan for Multiple Antipsychotic Therapies: Not  applicable  Discharge Orders   Future Orders Complete By Expires     Diet - low sodium heart healthy  As directed     Discharge instructions  As directed     Comments:      Take all of your medications as directed. Be sure to keep all of your follow up appointments.  If you are unable to keep your follow up appointment, call your Doctor's office to let them know, and reschedule.  Make sure that you have enough medication to last until your appointment. Be sure to get plenty of rest. Going to bed at the same time each night will help. Try to avoid sleeping during the day.  Increase your activity as tolerated. Regular exercise will help you to sleep better and improve your mental health. Eating a heart healthy diet is recommended. Try to avoid salty or fried foods. Be sure to  avoid all alcohol and illegal drugs.    Increase activity slowly  As directed         Medication List       Indication   albuterol 108 (90 BASE) MCG/ACT inhaler  Commonly known as:  PROVENTIL HFA;VENTOLIN HFA  Inhale 2 puffs into the lungs every 4 (four) hours as needed for wheezing or shortness of breath.      sertraline 50 MG tablet  Commonly known as:  ZOLOFT  Take 1 tablet (50 mg total) by mouth daily.   Indication:  Major Depressive Disorder           Follow-up Information   Follow up with [facility] On 08/10/2012. (Appt. at 12:00PM for hospital follow-up and assessment for SA IOP.)    Contact information:   [address] [place], [place] [address] phone: [phone] fax: [phone]      Follow-up recommendations:   Activities: Resume activity as tolerated. Diet: Heart healthy low sodium diet Tests: Follow up testing will be determined by your out patient provider. Comments:    Total Discharge Time:  Greater than 30 minutes.  Signed: Rona Ravens. [place]burn RPAC 9:36 AM 08/08/2012   Reviewed the information documented and agree with the treatment plan.  Birgit Nowling,JANARDHAHA  R. 08/08/2012 6:39 PM
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
## 2012-08-11 LAB — CLONAZEPAM LEVEL: Clonazepam Lvl: 25 mcg/L — ABNORMAL LOW (ref 30–60)

## 2012-08-13 NOTE — Progress Notes (Signed)
Patient Discharge Instructions:  After Visit Summary (AVS):   Faxed to:  08/13/12 Discharge Summary Note:   Faxed to:  08/13/12 Psychiatric Admission Assessment Note:   Faxed to:  08/13/12 Suicide Risk Assessment - Discharge Assessment:   Faxed to:  08/13/12 Faxed/Sent to the Next Level Care provider:  08/13/12 Faxed to [facility] @ [phone]  Jerelene Redden, 08/13/2012, 2:13 PM

## 2012-09-04 ENCOUNTER — Telehealth: Payer: Self-pay | Admitting: *Deleted

## 2012-09-04 NOTE — Telephone Encounter (Signed)
Pt notified of med instructions and sent to schedule appt with Dr. Karie Schwalbe for next week. Barry Dienes, LPN

## 2012-09-04 NOTE — Telephone Encounter (Signed)
Stopping lyrica abruptly can actually make some of those side effects worse,  I would encourage her to take only one of her 75mg  capsules every evening for five days then stop completely.  She'll need to f/u with Dr. Karie Schwalbe about birth control options when he returns next week.

## 2012-09-04 NOTE — Telephone Encounter (Signed)
Pt calls and states that Dr. Karie Schwalbe put her on Lyrica and has been on it for 20 days now and feels more depressed, irritable and having suicidal thoughts.  Also gave her the birth control patch and pt would like this changed to a pill form.

## 2012-09-11 ENCOUNTER — Encounter: Payer: Self-pay | Admitting: Sports Medicine

## 2012-09-11 ENCOUNTER — Ambulatory Visit (INDEPENDENT_AMBULATORY_CARE_PROVIDER_SITE_OTHER): Payer: BC Managed Care – PPO | Admitting: Sports Medicine

## 2012-09-11 VITALS — BP 115/70 | HR 71 | Wt 130.0 lb

## 2012-09-11 DIAGNOSIS — M7918 Myalgia, other site: Secondary | ICD-10-CM

## 2012-09-11 DIAGNOSIS — IMO0001 Reserved for inherently not codable concepts without codable children: Secondary | ICD-10-CM

## 2012-09-11 DIAGNOSIS — F3289 Other specified depressive episodes: Secondary | ICD-10-CM

## 2012-09-11 DIAGNOSIS — Z3009 Encounter for other general counseling and advice on contraception: Secondary | ICD-10-CM

## 2012-09-11 DIAGNOSIS — F329 Major depressive disorder, single episode, unspecified: Secondary | ICD-10-CM

## 2012-09-11 MED ORDER — NORGESTIMATE-ETH ESTRADIOL 0.25-35 MG-MCG PO TABS: 1.0000 | ORAL_TABLET | Freq: Every day | ORAL | Status: DC

## 2012-09-11 MED ORDER — DULOXETINE HCL 30 MG PO CPEP: 30.0000 mg | ORAL_CAPSULE | Freq: Every day | ORAL | Status: DC

## 2012-09-11 NOTE — Assessment & Plan Note (Signed)
Tapering down Zoloft, one half tablet daily for a week then stop. I'm going to refer her downstairs considering her history.

## 2012-09-11 NOTE — Assessment & Plan Note (Signed)
Starting Cymbalta. I'm going to have her taper down off of Zoloft. I'm also going to have her see psychiatry down stairs.

## 2012-09-11 NOTE — Progress Notes (Signed)
  Subjective:    CC: Followup  HPI: Mckynzie is a very pleasant 22 year old female, [facility].  Myofascial pain: With pain over dose, wrists, knees, hips, multiple areas on the back, I treated her with Lyrica recently, the Lyrica did improve her symptoms but unfortunately made her feel somewhat depressed. She has since stopped the Lyrica, and has continued on her Zoloft. Pain is mild, persistent. I have obtained an MRI in the past of her lumbar and cervical spine, they showed only minimal disease in the lumbar spine, she even had an epidural which was minimally effective.  Depression: Has been overall well controlled with Zoloft, 50 mg daily. She has no psychiatrist and is eager to start down stairs.   Birth control: Desires to switch from the patch to a pill.  Past medical history, Surgical history, Family history not pertinant except as noted below, Social history, Allergies, and medications have been entered into the medical record, reviewed, and no changes needed.   Review of Systems: No fevers, chills, night sweats, weight loss, chest pain, or shortness of breath.   Objective:    General: Well Developed, well nourished, and in no acute distress.  Neuro: Alert and oriented x3, extra-ocular muscles intact, sensation grossly intact.  HEENT: Normocephalic, atraumatic, pupils equal round reactive to light, neck supple, no masses, no lymphadenopathy, thyroid nonpalpable.  Skin: Warm and dry, no rashes. Cardiac: Regular rate and rhythm, no murmurs rubs or gallops, no lower extremity edema.  Respiratory: Clear to auscultation bilaterally. Not using accessory muscles, speaking in full sentences. Back Exam:  Inspection: Unremarkable  Motion: Flexion 45 deg, Extension 45 deg, Side Bending to 45 deg bilaterally,  Rotation to 45 deg bilaterally  SLR laying: Negative  XSLR laying: Negative  Palpable tenderness: None. FABER:  negative. Sensory change: Gross sensation intact to all lumbar and sacral dermatomes.  Reflexes: 2+ at both patellar tendons, 2+ at achilles tendons, Babinski's downgoing.  Strength at foot  Plantar-flexion: 5/5 Dorsi-flexion: 5/5 Eversion: 5/5 Inversion: 5/5  Leg strength  Quad: 5/5 Hamstring: 5/5 Hip flexor: 5/5 Hip abductors: 5/5  Gait unremarkable.   Impression and Recommendations:

## 2012-09-11 NOTE — Assessment & Plan Note (Signed)
Charting Sprintec.

## 2012-09-30 ENCOUNTER — Ambulatory Visit: Payer: Self-pay | Admitting: Sports Medicine

## 2012-10-01 ENCOUNTER — Encounter: Payer: Self-pay | Admitting: Sports Medicine

## 2012-10-01 ENCOUNTER — Ambulatory Visit (INDEPENDENT_AMBULATORY_CARE_PROVIDER_SITE_OTHER): Payer: BC Managed Care – PPO | Admitting: Sports Medicine

## 2012-10-01 VITALS — BP 113/78 | HR 63 | Wt 127.0 lb

## 2012-10-01 DIAGNOSIS — B1920 Unspecified viral hepatitis C without hepatic coma: Secondary | ICD-10-CM

## 2012-10-01 DIAGNOSIS — F1120 Opioid dependence, uncomplicated: Secondary | ICD-10-CM

## 2012-10-01 DIAGNOSIS — B070 Plantar wart: Secondary | ICD-10-CM | POA: Insufficient documentation

## 2012-10-01 DIAGNOSIS — M7918 Myalgia, other site: Secondary | ICD-10-CM

## 2012-10-01 DIAGNOSIS — IMO0001 Reserved for inherently not codable concepts without codable children: Secondary | ICD-10-CM

## 2012-10-01 MED ORDER — TRAMADOL HCL 50 MG PO TABS: ORAL_TABLET | ORAL | Status: DC

## 2012-10-01 NOTE — Progress Notes (Signed)
  Subjective:    CC: Followup  HPI: Myofascial pain: She discontinued the Cymbalta, did not like the way it made her feel. She has restarted her Zoloft.  Opiate abuse: 2 months ago she tried intravenous heroin, since then she's been an inpatient and subsequently intensive outpatient rehabilitation. Does desire to be checked for communicable diseases considering her recent intravenous drug use.  Plantar wart: Left-sided, painful, wonders if this can be burned off.  Past medical history, Surgical history, Family history not pertinant except as noted below, Social history, Allergies, and medications have been entered into the medical record, reviewed, and no changes needed.   Review of Systems: No fevers, chills, night sweats, weight loss, chest pain, or shortness of breath.   Objective:    General: Well Developed, well nourished, and in no acute distress.  Neuro: Alert and oriented x3, extra-ocular muscles intact, sensation grossly intact.  HEENT: Normocephalic, atraumatic, pupils equal round reactive to light, neck supple, no masses, no lymphadenopathy, thyroid nonpalpable.  Skin: Warm and dry, no rashes. Cardiac: Regular rate and rhythm, no murmurs rubs or gallops, no lower extremity edema.  Respiratory: Clear to auscultation bilaterally. Not using accessory muscles, speaking in full sentences. Left foot: There is a plantar wart.  Procedure:  Cryodestruction of left foot plantar wart x3. Consent obtained and verified. Time-out conducted. Noted no overlying erythema, induration, or other signs of local infection. Completed without difficulty using Cryo-Gun on each of the 3 plantar warts. Advised to call if fevers/chills, erythema, induration, drainage, or persistent bleeding. Impression and Recommendations:

## 2012-10-01 NOTE — Assessment & Plan Note (Addendum)
Unfortunately had a relapse 2 months ago he used intravenous heroin. She went to intensive outpatient treatment as well as drug [facility]. Checking for communicable diseases. We can check it at the 6 month point.

## 2012-10-01 NOTE — Assessment & Plan Note (Signed)
Cryotherapy performed as above. Return as needed for this. 

## 2012-10-01 NOTE — Assessment & Plan Note (Signed)
Discontinue Cymbalta. She has restarted her Zoloft. She did not like the way Cymbalta made her feel, adding tramadol for use at bedtime. We certainly can also consider Savella in the future.

## 2012-10-02 DIAGNOSIS — B1920 Unspecified viral hepatitis C without hepatic coma: Secondary | ICD-10-CM | POA: Insufficient documentation

## 2012-10-02 LAB — HIV ANTIBODY (ROUTINE TESTING W REFLEX): HIV: NONREACTIVE

## 2012-10-02 LAB — GC/CHLAMYDIA PROBE AMP, URINE
Chlamydia, Swab/Urine, PCR: NEGATIVE
GC Probe Amp, Urine: NEGATIVE

## 2012-10-02 LAB — HEPATITIS PANEL, ACUTE
HCV Ab: REACTIVE — AB
Hep A IgM: NEGATIVE
Hep B C IgM: NEGATIVE
Hepatitis B Surface Ag: NEGATIVE

## 2012-10-02 LAB — RPR

## 2012-10-02 NOTE — Addendum Note (Signed)
Addended by: Monica Becton on: 10/02/2012 01:52 PM   Modules accepted: Orders

## 2012-10-02 NOTE — Assessment & Plan Note (Signed)
Positive antibody test. Adding quantitative RNA confirmatory testing.

## 2012-10-05 ENCOUNTER — Encounter: Payer: Self-pay | Admitting: Sports Medicine

## 2012-10-05 LAB — HEPATITIS C RNA QUANTITATIVE
HCV Quantitative Log: 4.18 {Log} — ABNORMAL HIGH (ref <1.18)
HCV Quantitative: 15218 [IU]/mL — ABNORMAL HIGH (ref <15)

## 2012-10-06 ENCOUNTER — Encounter: Payer: Self-pay | Admitting: Sports Medicine

## 2012-10-06 ENCOUNTER — Ambulatory Visit (INDEPENDENT_AMBULATORY_CARE_PROVIDER_SITE_OTHER): Payer: BC Managed Care – PPO | Admitting: Sports Medicine

## 2012-10-06 VITALS — BP 115/75 | HR 77 | Wt 126.0 lb

## 2012-10-06 DIAGNOSIS — B070 Plantar wart: Secondary | ICD-10-CM

## 2012-10-06 DIAGNOSIS — F411 Generalized anxiety disorder: Secondary | ICD-10-CM

## 2012-10-06 DIAGNOSIS — B1920 Unspecified viral hepatitis C without hepatic coma: Secondary | ICD-10-CM

## 2012-10-06 MED ORDER — SERTRALINE HCL 50 MG PO TABS
50.0000 mg | ORAL_TABLET | Freq: Every day | ORAL | Status: DC
Start: 2012-10-06 — End: 2013-02-09

## 2012-10-06 NOTE — Progress Notes (Signed)
  Subjective:    CC: Followup  HPI: Hepatitis C: This occurred after use of intravenous heroin approximately 2 months ago, hepatitis C virus antibody was positive, confirmatory hepatitis C virus RNA was elevated. She does have some mild nausea but most recently has had normal liver function.  Plantar wart: Improved significantly since the last cryotherapy, desires repeat.  Irregular menstrual bleeding: Continues to be somewhat irregular on Ortho-Sprintec.  Anxiety/depression: Needs a refill on Zoloft, we have tried multiple other SSRIs, none of which have worked as well.  Past medical history, Surgical history, Family history not pertinant except as noted below, Social history, Allergies, and medications have been entered into the medical record, reviewed, and no changes needed.   Review of Systems: No fevers, chills, night sweats, weight loss, chest pain, or shortness of breath.   Objective:    General: Well Developed, well nourished, and in no acute distress.  Neuro: Alert and oriented x3, extra-ocular muscles intact, sensation grossly intact.  HEENT: Normocephalic, atraumatic, pupils equal round reactive to light, neck supple, no masses, no lymphadenopathy, thyroid nonpalpable. No scleral icterus. Skin: Warm and dry, no rashes. Cardiac: Regular rate and rhythm, no murmurs rubs or gallops, no lower extremity edema.  Respiratory: Clear to auscultation bilaterally. Not using accessory muscles, speaking in full sentences. Abdomen: Soft, nontender, nondistended, normal bowel sounds, no palpable masses.  Procedure:  Cryodestruction of 3 plantar warts on the left foot. Consent obtained and verified. Time-out conducted. Noted no overlying erythema, induration, or other signs of local infection. Completed without difficulty using Cryo-Gun. Advised to call if fevers/chills, erythema, induration, drainage, or persistent bleeding.  Impression and Recommendations:

## 2012-10-06 NOTE — Assessment & Plan Note (Signed)
Refilling Zoloft.

## 2012-10-06 NOTE — Assessment & Plan Note (Signed)
Hepatitis C antibody is reactive, RNA is elevated. At this point I am going to refer [facility] to [facility] hepatology. She will do very well.

## 2012-10-06 NOTE — Patient Instructions (Addendum)
Hepatitis C °Hepatitis C is a viral infection of the liver. Infection may go undetected for months or years because symptoms may be absent or very mild. Chronic liver disease is the main danger of hepatitis C. This may lead to scarring of the liver (cirrhosis), liver failure, and liver cancer. °CAUSES  °Hepatitis C is caused by the hepatitis C virus (HCV). Formerly, hepatitis C infections were most commonly transmitted through blood transfusions. In the early 1990s, routine testing of donated blood for hepatitis C and exclusion of blood that tests positive for HCV began. Now, HCV is most commonly transmitted from person to person through injection drug use, sharing needles, or sex with an infected person. A caregiver may also get the infection from exposure to the blood of an infected patient by way of a cut or needle stick.  °SYMPTOMS  °Acute Phase °Many cases of acute HCV infection are mild and cause few problems. Some people may not even realize they are sick. Symptoms in others may last a few weeks to several months and include: °· Feeling very tired. °· Loss of appetite. °· Nausea. °· Vomiting. °· Abdominal pain. °· Dark yellow urine. °· Yellow skin and eyes (jaundice). °· Itching of the skin. °Chronic Phase °· Between 50% to 85% of people who get HCV infection become "chronic carriers." They often have no symptoms, but the virus stays in their body. They may spread the virus to others and can get long-term liver disease. °· Many people with chronic HCV infection remain healthy for many years. However, up to 1 in 5 chronically infected people may develop severe liver diseases including scarring of the liver (cirrhosis), liver failure, or liver cancer. °DIAGNOSIS  °Diagnosis of hepatitis C infection is made by testing blood for the presence of hepatitis C viral particles called RNA. Other tests may also be done to measure the status of current liver function, exclude other liver problems, or assess liver  damage. °TREATMENT  °Treatment with many antiviral drugs is available and recommended for some patients with chronic HCV infection. Drug treatment is generally considered appropriate for patients who: °· Are 18 years of age or older. °· Have a positive test for HCV particles in the blood. °· Have a liver tissue sample (biopsy) that shows chronic hepatitis and significant scarring (fibrosis). °· Do not have signs of liver failure. °· Have acceptable blood test results that confirm the wellness of other body organs. °· Are willing to be treated and conform to treatment requirements. °· Have no other circumstances that would prevent treatment from being recommended (contraindications). °All people who are offered and choose to receive drug treatment must understand that careful medical follow up for many months and even years is crucial in order to make successful care possible. The goal of drug treatment is to eliminate any evidence of HCV in the blood on a long-term basis. This is called a "sustained virologic response" or SVR. Achieving a SVR is associated with a decrease in the chance of life-threatening liver problems, need for a liver transplant, liver cancer rates, and liver-related complications. °Successful treatment currently requires taking treatment drugs for at least 24 weeks and up to 72 weeks. An injected drug (interferon) given weekly and an oral antiviral medicine taken daily are usually prescribed. Side effects from these drugs are common and some may be very serious. Your response to treatment must be carefully monitored by both you and your caregiver throughout the entire treatment period. °PREVENTION °There is no vaccine for hepatitis C. The only   way to prevent the disease is to reduce the risk of exposure to the virus.  °· Avoid sharing drug needles or personal items like toothbrushes, razors, and nail clippers with an infected person. °· Healthcare workers need to avoid injuries and wear  appropriate protective equipment such as gloves, gowns, and face masks when performing invasive medical or nursing procedures. °HOME CARE INSTRUCTIONS  °To avoid making your liver disease worse: °· Strictly avoid drinking alcohol. °· Carefully review all new prescriptions of medicines with your caregiver. Ask your caregiver which drugs you should avoid. The following drugs are toxic to the liver, and your caregiver may tell you to avoid them: °· Isoniazid. °· Methyldopa. °· Acetaminophen. °· Anabolic steroids (muscle-building drugs). °· Erythromycin. °· Oral contraceptives (birth control pills). °· Check with your caregiver to make sure medicine you are currently taking will not be harmful. °· Periodic blood tests may be required. Follow your caregiver's advice about when you should have blood tests. °· Avoid a sexual relationship until advised otherwise by your caregiver. °· Avoid activities that could expose other people to your blood. Examples include sharing a toothbrush, nail clippers, razors, and needles. °· Bed rest is not necessary, but it may make you feel better. Recovery time is not related to the amount of rest you receive. °· This infection is contagious. Follow your caregiver's instructions in order to avoid spread of the infection. °SEEK IMMEDIATE MEDICAL CARE IF: °· You have increasing fatigue or weakness. °· You have an oral temperature above 102° F (38.9° C), not controlled by medicine. °· You develop loss of appetite, nausea, or vomiting. °· You develop jaundice. °· You develop easy bruising or bleeding. °· You develop any severe problems as a result of your treatment. °MAKE SURE YOU:  °· Understand these instructions. °· Will watch your condition. °· Will get help right away if you are not doing well or get worse. °Document Released: 12/29/1999 Document Revised: 03/25/2011 Document Reviewed: 05/02/2010 °[employer]® Patient Information ©2014 [employer]. ° °

## 2012-10-06 NOTE — Assessment & Plan Note (Addendum)
Cryotherapy performed again on 3 plantar verruca.

## 2012-10-27 ENCOUNTER — Encounter: Payer: Self-pay | Admitting: Sports Medicine

## 2012-10-27 ENCOUNTER — Ambulatory Visit (INDEPENDENT_AMBULATORY_CARE_PROVIDER_SITE_OTHER): Payer: BC Managed Care – PPO | Admitting: Sports Medicine

## 2012-10-27 ENCOUNTER — Ambulatory Visit: Payer: Self-pay | Admitting: Sports Medicine

## 2012-10-27 VITALS — BP 121/82 | HR 84 | Wt 131.0 lb

## 2012-10-27 DIAGNOSIS — B070 Plantar wart: Secondary | ICD-10-CM

## 2012-10-27 DIAGNOSIS — B1920 Unspecified viral hepatitis C without hepatic coma: Secondary | ICD-10-CM

## 2012-10-27 DIAGNOSIS — Z23 Encounter for immunization: Secondary | ICD-10-CM

## 2012-10-27 NOTE — Assessment & Plan Note (Signed)
Cryotherapy again performed today. Return as needed for this.

## 2012-10-27 NOTE — Assessment & Plan Note (Addendum)
Rechecking RNA load, CBC, CMET, PT, INR, TSH. New referral for hepatology.

## 2012-10-27 NOTE — Progress Notes (Signed)
  Subjective:    CC: Followup  HPI: Hepatitis C: Previous RNA viral load was 15,000, we scheduled her with hepatology and fortunately they cannot see her until January. She is looking to get in with someone sooner. Denies nausea, vomiting, yellowing of the eyes, abdominal pain.  Plantar wart: Much better after cryotherapy at the previous visit, desires repeat.  Past medical history, Surgical history, Family history not pertinant except as noted below, Social history, Allergies, and medications have been entered into the medical record, reviewed, and no changes needed.   Review of Systems: No fevers, chills, night sweats, weight loss, chest pain, or shortness of breath.   Objective:    General: Well Developed, well nourished, and in no acute distress.  Neuro: Alert and oriented x3, extra-ocular muscles intact, sensation grossly intact.  HEENT: Normocephalic, atraumatic, pupils equal round reactive to light, neck supple, no masses, no lymphadenopathy, thyroid nonpalpable.  Skin: Warm and dry, no rashes. Cardiac: Regular rate and rhythm, no murmurs rubs or gallops, no lower extremity edema.  Respiratory: Clear to auscultation bilaterally. Not using accessory muscles, speaking in full sentences. Abdomen: Soft, nontender, nondistended, no palpable masses, normal bowel sounds.  Procedure:  Cryodestruction of left plantar wart Consent obtained and verified. Time-out conducted. Noted no overlying erythema, induration, or other signs of local infection. Completed without difficulty using Cryo-Gun. Advised to call if fevers/chills, erythema, induration, drainage, or persistent bleeding.  Impression and Recommendations:

## 2012-10-28 LAB — COMPREHENSIVE METABOLIC PANEL
ALT: 25 U/L (ref 0–35)
CO2: 32 mEq/L (ref 19–32)
Chloride: 101 mEq/L (ref 96–112)
Sodium: 135 mEq/L (ref 135–145)
Total Bilirubin: 0.4 mg/dL (ref 0.3–1.2)
Total Protein: 7.4 g/dL (ref 6.0–8.3)

## 2012-10-28 LAB — CBC
HCT: 36.5 % (ref 36.0–46.0)
Hemoglobin: 12 g/dL (ref 12.0–15.0)
MCH: 28 pg (ref 26.0–34.0)
MCHC: 32.9 g/dL (ref 30.0–36.0)
MCV: 85.3 fL (ref 78.0–100.0)
Platelets: 316 10*3/uL (ref 150–400)
RBC: 4.28 MIL/uL (ref 3.87–5.11)
RDW: 14.5 % (ref 11.5–15.5)
WBC: 5.8 10*3/uL (ref 4.0–10.5)

## 2012-10-28 LAB — COMPREHENSIVE METABOLIC PANEL WITH GFR
AST: 17 U/L (ref 0–37)
Albumin: 3.7 g/dL (ref 3.5–5.2)
Alkaline Phosphatase: 59 U/L (ref 39–117)
BUN: 8 mg/dL (ref 6–23)
Calcium: 9.1 mg/dL (ref 8.4–10.5)
Creat: 0.78 mg/dL (ref 0.50–1.10)
Glucose, Bld: 85 mg/dL (ref 70–99)
Potassium: 3.8 meq/L (ref 3.5–5.3)

## 2012-10-28 LAB — TSH: TSH: 0.623 u[IU]/mL (ref 0.350–4.500)

## 2012-10-28 LAB — PROTIME-INR
INR: 0.96 (ref <1.50)
Prothrombin Time: 12.8 s (ref 11.6–15.2)

## 2012-10-28 LAB — HEPATITIS C RNA QUANTITATIVE
HCV Quantitative Log: 2.56 {Log} — ABNORMAL HIGH (ref <1.18)
HCV Quantitative: 364 IU/mL — ABNORMAL HIGH (ref <15)

## 2012-10-28 LAB — APTT: aPTT: 30 s (ref 24–37)

## 2012-10-29 ENCOUNTER — Ambulatory Visit: Payer: Self-pay | Admitting: Sports Medicine

## 2012-11-06 ENCOUNTER — Ambulatory Visit: Payer: Self-pay | Admitting: Sports Medicine

## 2012-11-24 ENCOUNTER — Encounter: Payer: Self-pay | Admitting: Sports Medicine

## 2012-11-24 ENCOUNTER — Ambulatory Visit (INDEPENDENT_AMBULATORY_CARE_PROVIDER_SITE_OTHER): Payer: BC Managed Care – PPO | Admitting: Sports Medicine

## 2012-11-24 VITALS — BP 127/74 | HR 98 | Wt 130.0 lb

## 2012-11-24 DIAGNOSIS — B1920 Unspecified viral hepatitis C without hepatic coma: Secondary | ICD-10-CM

## 2012-11-24 DIAGNOSIS — B070 Plantar wart: Secondary | ICD-10-CM

## 2012-11-24 DIAGNOSIS — M533 Sacrococcygeal disorders, not elsewhere classified: Secondary | ICD-10-CM | POA: Insufficient documentation

## 2012-11-24 DIAGNOSIS — IMO0001 Reserved for inherently not codable concepts without codable children: Secondary | ICD-10-CM

## 2012-11-24 NOTE — Assessment & Plan Note (Addendum)
Guided injection to SI joint as above. This will be both diagnostic and therapeutic. Return in 2-3 weeks to see how things are going. She does not desire to proceed with any further pharmacologic intervention. She had a 1.5 cm leg length discrepancy which is not clinically significant.  Complete pain relief after a proximal SI joint injection as above.

## 2012-11-24 NOTE — Progress Notes (Signed)
  Subjective:    CC: Followup  HPI: Low back pain: Had a temporary and nonsustained response to epidural in the recent past, unfortunately continues to have pain which localizes over the right SI joint. She does wonder if she needs an SI joint fusion. Pain is localized, worse with ambulation, pain does radiate down into the right buttock. Pain is mild, persistent. It was fairly severe during her pregnancy.  Hepatitis C: Most recent viral RNA load was down to the 300s, she is still not yet seen a hepatologist. No abdominal pain, nausea, no yellowing of the eyes.  Plantar wart: Essentially resolved now after 2 sessions of cryotherapy.  Past medical history, Surgical history, Family history not pertinant except as noted below, Social history, Allergies, and medications have been entered into the medical record, reviewed, and no changes needed.   Review of Systems: No fevers, chills, night sweats, weight loss, chest pain, or shortness of breath.   Objective:    General: Well Developed, well nourished, and in no acute distress.  Neuro: Alert and oriented x3, extra-ocular muscles intact, sensation grossly intact.  HEENT: Normocephalic, atraumatic, pupils equal round reactive to light, neck supple, no masses, no lymphadenopathy, thyroid nonpalpable.  Skin: Warm and dry, no rashes. Cardiac: Regular rate and rhythm, no murmurs rubs or gallops, no lower extremity edema.  Respiratory: Clear to auscultation bilaterally. Not using accessory muscles, speaking in full sentences. Back Exam:  Inspection: Unremarkable  Motion: Flexion 45 deg, Extension 45 deg, Side Bending to 45 deg bilaterally,  Rotation to 45 deg bilaterally  SLR laying: Negative  XSLR laying: Negative  Palpable tenderness: Tender to palpation just medial to the right posterior-superior iliac. FABER: negative. Sensory change: Gross sensation intact to all lumbar and sacral dermatomes.  Reflexes: 2+ at both patellar tendons, 2+ at  achilles tendons, Babinski's downgoing.  Strength at foot  Plantar-flexion: 5/5 Dorsi-flexion: 5/5 Eversion: 5/5 Inversion: 5/5  Leg strength  Quad: 5/5 Hamstring: 5/5 Hip flexor: 5/5 Hip abductors: 5/5  Gait unremarkable.  Procedure: Real-time Ultrasound Guided Injection of right SI joint Device: GE Logiq E  Verbal informed consent obtained.  Time-out conducted.  Noted no overlying erythema, induration, or other signs of local infection.  Skin prepped in a sterile fashion.  Local anesthesia: Topical Ethyl chloride.  With sterile technique and under real time ultrasound guidance:  Spinal needle advanced just medial to the posterior-superior iliac spine, the needle was walked along the sacrum until it was felt to protrude into the joint, a total of 1 cc Kenalog 40, 3 cc lidocaine injected. Completed without difficulty  Pain immediately resolved suggesting accurate placement of the medication.  Advised to call if fevers/chills, erythema, induration, drainage, or persistent bleeding.  Images permanently stored and available for review in the ultrasound unit.  Impression: Technically successful ultrasound guided injection.  Impression and Recommendations:

## 2012-11-24 NOTE — Assessment & Plan Note (Signed)
Hepatitis C viral or any load is down to 300. We are still awaiting hepatology consultation. She had no transaminitis on lab work.

## 2012-11-24 NOTE — Assessment & Plan Note (Signed)
Essentially resolved after 2 sessions of cryotherapy.

## 2012-12-15 ENCOUNTER — Ambulatory Visit (INDEPENDENT_AMBULATORY_CARE_PROVIDER_SITE_OTHER): Payer: BC Managed Care – PPO | Admitting: Sports Medicine

## 2012-12-15 ENCOUNTER — Encounter: Payer: Self-pay | Admitting: Sports Medicine

## 2012-12-15 VITALS — BP 114/78 | HR 76 | Wt 127.0 lb

## 2012-12-15 DIAGNOSIS — B1920 Unspecified viral hepatitis C without hepatic coma: Secondary | ICD-10-CM

## 2012-12-15 DIAGNOSIS — IMO0001 Reserved for inherently not codable concepts without codable children: Secondary | ICD-10-CM

## 2012-12-15 DIAGNOSIS — M7918 Myalgia, other site: Secondary | ICD-10-CM

## 2012-12-15 DIAGNOSIS — M533 Sacrococcygeal disorders, not elsewhere classified: Secondary | ICD-10-CM

## 2012-12-15 NOTE — Assessment & Plan Note (Signed)
Trigger point injections performed today

## 2012-12-15 NOTE — Assessment & Plan Note (Signed)
Improved significantly after guided SI joint injection. We can do this 4 times per year.

## 2012-12-15 NOTE — Progress Notes (Signed)
  Subjective:    CC: Followup  HPI: Hepatitis C: Most recent viral RNA load was low, she does have an appointment later this month with hepatology.  Neck pain: Multiple tender areas, she does have a history of myofascial pain syndrome. Wondering if we can do an injection.  Right sacroiliac dysfunction: Resolved with ultrasound guided SI joint injection one month ago.  Past medical history, Surgical history, Family history not pertinant except as noted below, Social history, Allergies, and medications have been entered into the medical record, reviewed, and no changes needed.   Review of Systems: No fevers, chills, night sweats, weight loss, chest pain, or shortness of breath.   Objective:    General: Well Developed, well nourished, and in no acute distress.  Neuro: Alert and oriented x3, extra-ocular muscles intact, sensation grossly intact.  HEENT: Normocephalic, atraumatic, pupils equal round reactive to light, neck supple, no masses, no lymphadenopathy, thyroid nonpalpable.  Skin: Warm and dry, no rashes. Cardiac: Regular rate and rhythm, no murmurs rubs or gallops, no lower extremity edema.  Respiratory: Clear to auscultation bilaterally. Not using accessory muscles, speaking in full sentences. Neck: Inspection unremarkable. No palpable stepoffs. Multiple tender trigger points. Negative Spurling's maneuver. Full neck range of motion Grip strength and sensation normal in bilateral hands Strength good C4 to T1 distribution No sensory change to C4 to T1 Negative Hoffman sign bilaterally Reflexes normal  Procedure:  Injection of painful trigger points along the paracervical spine Consent obtained and verified. Time-out conducted. Noted no overlying erythema, induration, or other signs of local infection. Skin prepped in a sterile fashion. Topical analgesic spray: Ethyl chloride. Completed without difficulty. Meds: To painful trigger points were injected with a total of 1 cc  Kenalog 40, 3 cc lidocaine in the right paracervical muscles. Pain immediately improved suggesting accurate placement of the medication. Advised to call if fevers/chills, erythema, induration, drainage, or persistent bleeding.  I did fill out her DMV forms. She will need to see an optometrist.  Impression and Recommendations:

## 2012-12-15 NOTE — Assessment & Plan Note (Addendum)
Has an appointment with hepatology on December 10. Rechecking hepatitis C viral load and HIV antibody.

## 2012-12-16 LAB — HEPATITIS C RNA QUANTITATIVE: HCV Quantitative: NOT DETECTED IU/mL (ref <15)

## 2012-12-16 LAB — HIV ANTIBODY (ROUTINE TESTING W REFLEX): HIV: NONREACTIVE

## 2012-12-23 ENCOUNTER — Other Ambulatory Visit: Payer: Self-pay | Admitting: Internal Medicine

## 2012-12-23 DIAGNOSIS — C220 Liver cell carcinoma: Secondary | ICD-10-CM

## 2012-12-25 ENCOUNTER — Encounter: Payer: Self-pay | Admitting: Family Medicine

## 2012-12-25 ENCOUNTER — Ambulatory Visit (INDEPENDENT_AMBULATORY_CARE_PROVIDER_SITE_OTHER): Payer: BC Managed Care – PPO | Admitting: Family Medicine

## 2012-12-25 VITALS — BP 113/71 | HR 91 | Temp 98.1°F | Wt 133.0 lb

## 2012-12-25 DIAGNOSIS — J4520 Mild intermittent asthma, uncomplicated: Secondary | ICD-10-CM

## 2012-12-25 DIAGNOSIS — J45901 Unspecified asthma with (acute) exacerbation: Secondary | ICD-10-CM

## 2012-12-25 MED ORDER — ALBUTEROL SULFATE HFA 108 (90 BASE) MCG/ACT IN AERS: 2.0000 | INHALATION_SPRAY | RESPIRATORY_TRACT | Status: DC | PRN

## 2012-12-25 MED ORDER — DOXYCYCLINE HYCLATE 100 MG PO TABS: ORAL_TABLET | ORAL | Status: DC

## 2012-12-25 MED ORDER — BECLOMETHASONE DIPROPIONATE 80 MCG/ACT IN AERS: 1.0000 | INHALATION_SPRAY | Freq: Two times a day (BID) | RESPIRATORY_TRACT | Status: DC

## 2012-12-25 MED ORDER — PREDNISONE 50 MG PO TABS: ORAL_TABLET | ORAL | Status: DC

## 2012-12-25 NOTE — Progress Notes (Signed)
CC: [name] is a 22 y.o. female is here for Sore Throat   Subjective: HPI:  Complains of cough and shortness of breath that has been present for the past week worsening on a daily basis now moderate in severity. Present all hours of the day but worse in the evenings. Slightly improved with albuterol however not 100% resolved. Nothing else makes better or worse other than above. Cough is nonproductive. She reports fatigue questionable subjective fevers but no chills or night sweats. Denies nasal congestion, facial pain, myalgia.  Mentions that she is been avoiding exercise even in her regular state of health due to shortness of breath and wheezing this occurs even if she uses albuterol. These symptoms have been present for the last 3-6 months.  She reports she was once on Advair and these symptoms were never present during that time  Review Of Systems Outlined In HPI  Past Medical History  Diagnosis Date  . Asthma   . Depression   . Anxiety   . History of chicken pox     as a child  . History of polydrug abuse      Family History  Problem Relation Age of Onset  . Diabetes Mother   . Hypertension Mother   . Hypertension Maternal Grandmother   . Diabetes Maternal Grandmother   . Cancer Maternal Grandmother     Breast  . Cancer Maternal Grandfather     Lung     History  Substance Use Topics  . Smoking status: Never Smoker   . Smokeless tobacco: Never Used  . Alcohol Use: No     Objective: Filed Vitals:   12/25/12 1028  BP: 113/71  Pulse: 91  Temp: 98.1 F (36.7 C)    General: Alert and Oriented, No Acute Distress HEENT: Pupils equal, round, reactive to light. Conjunctivae clear.  External ears unremarkable, canals clear with intact TMs with appropriate landmarks.  Middle ear appears open without effusion. Pink inferior turbinates.  Moist mucous membranes, pharynx without inflammation nor lesions.  Neck supple without palpable lymphadenopathy nor abnormal  masses. Lungs: Comfortable work of breathing with mild end expiratory wheezing without rhonchi or rails. Cardiac: Regular rate and rhythm. Normal S1/S2.  No murmurs, rubs, nor gallops.   Mental Status: No depression, anxiety, nor agitation. Skin: Warm and dry.  Assessment & Plan: [name] was seen today for sore throat.  Diagnoses and associated orders for this visit:  Asthma with acute exacerbation - predniSONE (DELTASONE) 50 MG tablet; One by mouth daily for five days. - doxycycline (VIBRA-TABS) 100 MG tablet; One by mouth twice a day for ten days. - albuterol (PROVENTIL HFA;VENTOLIN HFA) 108 (90 BASE) MCG/ACT inhaler; Inhale 2 puffs into the lungs every 4 (four) hours as needed for wheezing or shortness of breath.  Asthma, mild intermittent, poorly controlled - beclomethasone (QVAR) 80 MCG/ACT inhaler; Inhale 1 puff into the lungs 2 (two) times daily.    Asthma exacerbation: Start prednisone and doxycycline, albuterol every 4-6 hours scheduled for the first 48 hours then as needed Uncontrolled asthma prior to today's presenting complaint, start low-dose Qvar samples given followup with PCP for long term asthma strategy  Return in about 4 weeks (around 01/22/2013).

## 2012-12-29 ENCOUNTER — Other Ambulatory Visit: Payer: Self-pay

## 2013-02-09 ENCOUNTER — Other Ambulatory Visit: Payer: Self-pay | Admitting: Sports Medicine

## 2013-03-06 ENCOUNTER — Emergency Department
Admission: EM | Admit: 2013-03-06 | Discharge: 2013-03-06 | Disposition: A | Discharge status: Home / Self Care | Payer: BC Managed Care – PPO | Source: Home / Self Care | Attending: Family Medicine | Admitting: Family Medicine

## 2013-03-06 ENCOUNTER — Encounter: Payer: Self-pay | Admitting: Emergency Medicine

## 2013-03-06 DIAGNOSIS — R112 Nausea with vomiting, unspecified: Secondary | ICD-10-CM

## 2013-03-06 MED ORDER — ONDANSETRON HCL 4 MG/2ML IJ SOLN
4.0000 mg | Freq: Once | INTRAMUSCULAR | Status: AC
  Administered 2013-03-06: 4 mg via INTRAMUSCULAR

## 2013-03-06 MED ORDER — ONDANSETRON 4 MG PO TBDP: ORAL_TABLET | ORAL | Status: DC

## 2013-03-06 NOTE — ED Provider Notes (Signed)
CSN: [id number]     Arrival date & time 03/06/13  1707 History   First MD Initiated Contact with Patient 03/06/13 1825     Chief Complaint  Patient presents with  . Emesis  . Abdominal Pain        HPI Comments: Patient developed chills, myalgias, fatigue, nausea and stomach cramps at 4am today.  Her young son was sick several days ago with vomiting and watery diarrhea.  She denies diarrhea at present.  The history is provided by the patient.    Past Medical History  Diagnosis Date  . Asthma   . Depression   . Anxiety   . History of chicken pox     as a child  . History of polydrug abuse    Past Surgical History  Procedure Laterality Date  . No past surgeries    . Cesarean section  07/24/2011    Procedure: CESAREAN SECTION;  Surgeon: Sharene Butters, MD;  Location: [facility];  Service: Gynecology;  Laterality: N/A;   Family History  Problem Relation Age of Onset  . Diabetes Mother   . Hypertension Mother   . Hypertension Maternal Grandmother   . Diabetes Maternal Grandmother   . Cancer Maternal Grandmother     Breast  . Cancer Maternal Grandfather     Lung   History  Substance Use Topics  . Smoking status: Never Smoker   . Smokeless tobacco: Never Used  . Alcohol Use: No   OB History   Grav Para Term Preterm Abortions TAB SAB Ect Mult Living   1 1 1  0 0 0 0 0 0 1     Review of Systems  Constitutional: Positive for chills, appetite change and fatigue. Negative for fever and diaphoresis.  HENT: Negative.   Eyes: Negative.   Respiratory: Negative.   Cardiovascular: Negative.   Gastrointestinal: Positive for nausea and abdominal pain. Negative for vomiting, diarrhea, constipation, blood in stool, abdominal distention and anal bleeding.  Genitourinary: Negative.   Musculoskeletal: Positive for myalgias. Negative for neck stiffness.  Skin: Negative.   Neurological: Negative for headaches.      Allergies  Penicillins and Cephalosporins  Home Medications    Current Outpatient Rx  Name  Route  Sig  Dispense  Refill  . albuterol (PROVENTIL HFA;VENTOLIN HFA) 108 (90 BASE) MCG/ACT inhaler   Inhalation   Inhale 2 puffs into the lungs every 4 (four) hours as needed for wheezing or shortness of breath.   1 Inhaler   11   . beclomethasone (QVAR) 80 MCG/ACT inhaler   Inhalation   Inhale 1 puff into the lungs 2 (two) times daily.   1 Inhaler   0   . norgestimate-ethinyl estradiol (SPRINTEC 28) 0.25-35 MG-MCG tablet   Oral   Take 1 tablet by mouth daily.   1 Package   11   . ondansetron (ZOFRAN ODT) 4 MG disintegrating tablet      Take one tab by mouth Q6hr prn nausea   12 tablet   0   . predniSONE (DELTASONE) 50 MG tablet      One by mouth daily for five days.   5 tablet   0   . sertraline (ZOLOFT) 50 MG tablet      TAKE 1 TABLET (50 MG TOTAL) BY MOUTH DAILY.... MAX OF 30 DAYS   90 tablet   2   . traMADol (ULTRAM) 50 MG tablet      1-2 tabs by mouth Q8 hours, maximum 6 tabs  per day.   90 tablet   0    BP 114/71  Pulse 110  Temp(Src) 98.6 F (37 C) (Oral)  Resp 16  Ht 5\' 6"  (1.676 m)  Wt 127 lb 4 oz (57.72 kg)  BMI 20.55 kg/m2  SpO2 100% Physical Exam Nursing notes and Vital Signs reviewed. Appearance:  Patient appears healthy, stated age, and in no acute distress Eyes:  Pupils are equal, round, and reactive to light and accomodation.  Extraocular movement is intact.  Conjunctivae are not inflamed   Ears:  Canals normal.  Tympanic membranes normal.  Nose:   Normal turbinates.  No sinus tenderness.   Mouth/Pharynx:  Normal; moist mucous membranes  Neck:  Supple.  Slightly tender shotty posterior nodes are palpated bilaterally  Lungs:  Clear to auscultation.  Breath sounds are equal.  Heart:  Regular rate and rhythm without murmurs, rubs, or gallops.  Abdomen:  Nontender without masses or hepatosplenomegaly.  Bowel sounds are present.  No CVA or flank tenderness.  Extremities:  No edema.  No calf tenderness Skin:   No rash present.   ED Course  Procedures  none      MDM   Final diagnoses:  Nausea with vomiting; suspect viral gastroenteritis    Zofran 4mg  IM.  Rx for Zofran 4mg  ODT Begin clear liquids (Pedialyte if having diarrhea) until improved, then advance to a BRAT diet.  Then gradually resume a regular diet when tolerated.  Avoid milk products until well.  To decrease diarrhea, mix one heaping tablespoon Citrucel (methylcellulose) in 8 oz water and drink one to three times daily.  When stools become more formed, may take Imodium (loperamide) once or twice daily to decrease stool frequency.  If symptoms become significantly worse during the night or over the weekend, proceed to the local emergency room.  Followup with Family Doctor if not improved in 3 to 4 days.    Kandra Nicolas, MD 03/07/13 [phone]

## 2013-03-06 NOTE — Discharge Instructions (Signed)
Begin clear liquids (Pedialyte if having diarrhea) until improved, then advance to a BRAT diet.  Then gradually resume a regular diet when tolerated.  Avoid milk products until well.  To decrease diarrhea, mix one heaping tablespoon Citrucel (methylcellulose) in 8 oz water and drink one to three times daily.  When stools become more formed, may take Imodium (loperamide) once or twice daily to decrease stool frequency.  If symptoms become significantly worse during the night or over the weekend, proceed to the local emergency room.

## 2013-03-06 NOTE — ED Notes (Signed)
Patient c/o vomiting, nausea, body aches and chills since 4 AM.

## 2013-03-11 ENCOUNTER — Other Ambulatory Visit: Payer: Self-pay | Admitting: Sports Medicine

## 2013-03-15 ENCOUNTER — Ambulatory Visit (INDEPENDENT_AMBULATORY_CARE_PROVIDER_SITE_OTHER): Payer: BC Managed Care – PPO | Admitting: Sports Medicine

## 2013-03-15 ENCOUNTER — Encounter: Payer: Self-pay | Admitting: Sports Medicine

## 2013-03-15 VITALS — BP 101/60 | HR 90 | Ht 66.0 in | Wt 131.0 lb

## 2013-03-15 DIAGNOSIS — M533 Sacrococcygeal disorders, not elsewhere classified: Secondary | ICD-10-CM

## 2013-03-15 DIAGNOSIS — F329 Major depressive disorder, single episode, unspecified: Secondary | ICD-10-CM

## 2013-03-15 DIAGNOSIS — F3289 Other specified depressive episodes: Secondary | ICD-10-CM

## 2013-03-15 DIAGNOSIS — B1920 Unspecified viral hepatitis C without hepatic coma: Secondary | ICD-10-CM

## 2013-03-15 LAB — HEPATITIS PANEL, ACUTE
HCV Ab: REACTIVE — AB
Hep A IgM: NONREACTIVE
Hep B C IgM: NONREACTIVE
Hepatitis B Surface Ag: NEGATIVE

## 2013-03-15 LAB — HIV ANTIBODY (ROUTINE TESTING W REFLEX): HIV: NONREACTIVE

## 2013-03-15 MED ORDER — VENLAFAXINE HCL ER 75 MG PO CP24: 75.0000 mg | ORAL_CAPSULE | Freq: Every day | ORAL | Status: DC

## 2013-03-15 MED ORDER — VENLAFAXINE HCL ER 37.5 MG PO CP24: 37.5000 mg | ORAL_CAPSULE | Freq: Every day | ORAL | Status: DC

## 2013-03-15 NOTE — Assessment & Plan Note (Signed)
Depression is overall relatively well controlled, she does continue to have some myofascial type pain symptoms we are going to switch to venlafaxine.

## 2013-03-15 NOTE — Assessment & Plan Note (Signed)
Rechecking hepatitis C viral load. It has now been negative for several months both here and at the hepatologist office.

## 2013-03-15 NOTE — Patient Instructions (Signed)
Hip Rehabilitation Protocol:  1.  Side leg raises.  3x30 with no weight, then 3x15 with 2 lb ankle weight, then 3x15 with 5 lb ankle weight 2.  Standing hip rotation.  3x30 with no weight, then 3x15 with 2 lb ankle weight, then 3x15 with 5 lb ankle weight. 3.  Side step ups.  3x30 with no weight, then 3x15 with 5 lbs in backpack, then 3x15 with 10 lbs in backpack.  One half of a Zoloft tablet daily for 3 days then stop. 37.5 mg of Effexor or daily for one week, then go up to the full 75 mg.

## 2013-03-15 NOTE — Assessment & Plan Note (Signed)
She does have significant right-sided hip abductor weakness. She is requesting an MRI, anatomically she is normal, she does have a biomechanical deficit which we will correct before pursuing advanced imaging.  Return to see me in 6 weeks.

## 2013-03-15 NOTE — Progress Notes (Signed)
  Subjective:    CC: Followup  HPI: Chronic hepatitis C: Most recent viral loads were undetectable. Feeling well.  Mood disorder: She also has significant myofascial pain symptoms, Zoloft was working okay however she does tell me she like to go up on the dose. No suicidal or homicidal ideation.  SI joint dysfunction: Resolved after injection several months ago.  Past medical history, Surgical history, Family history not pertinant except as noted below, Social history, Allergies, and medications have been entered into the medical record, reviewed, and no changes needed.   Review of Systems: No fevers, chills, night sweats, weight loss, chest pain, or shortness of breath.   Objective:    General: Well Developed, well nourished, and in no acute distress.  Neuro: Alert and oriented x3, extra-ocular muscles intact, sensation grossly intact.  HEENT: Normocephalic, atraumatic, pupils equal round reactive to light, neck supple, no masses, no lymphadenopathy, thyroid nonpalpable.  Skin: Warm and dry, no rashes. Cardiac: Regular rate and rhythm, no murmurs rubs or gallops, no lower extremity edema.  Respiratory: Clear to auscultation bilaterally. Not using accessory muscles, speaking in full sentences.  Impression and Recommendations:

## 2013-03-16 LAB — HEPATITIS C VRS RNA DETECT BY PCR-QUAL: Hepatitis C Vrs RNA by PCR-Qual: NEGATIVE

## 2013-03-24 ENCOUNTER — Telehealth: Payer: Self-pay

## 2013-03-24 NOTE — Telephone Encounter (Signed)
Patient called stated that she was prescribed Effexor and it is not working and  making her agitated everyday she wants to know if it is ok for her to stop the medication. Daylan Boggess,CMA

## 2013-03-24 NOTE — Telephone Encounter (Signed)
That was only a few days ago, the agitation will pass, stick with it for another week or two please!

## 2013-03-25 NOTE — Telephone Encounter (Signed)
Spoke to patient gave her instructions as noted below. Alejandra George,CMA  

## 2013-08-05 ENCOUNTER — Ambulatory Visit (INDEPENDENT_AMBULATORY_CARE_PROVIDER_SITE_OTHER): Payer: BC Managed Care – PPO | Admitting: Sports Medicine

## 2013-08-05 ENCOUNTER — Encounter: Payer: Self-pay | Admitting: Sports Medicine

## 2013-08-05 ENCOUNTER — Ambulatory Visit (INDEPENDENT_AMBULATORY_CARE_PROVIDER_SITE_OTHER): Payer: BC Managed Care – PPO

## 2013-08-05 VITALS — BP 113/81 | HR 87 | Ht 65.0 in | Wt 148.0 lb

## 2013-08-05 DIAGNOSIS — F341 Dysthymic disorder: Secondary | ICD-10-CM

## 2013-08-05 DIAGNOSIS — M25511 Pain in right shoulder: Secondary | ICD-10-CM | POA: Insufficient documentation

## 2013-08-05 DIAGNOSIS — M25519 Pain in unspecified shoulder: Secondary | ICD-10-CM

## 2013-08-05 DIAGNOSIS — F418 Other specified anxiety disorders: Secondary | ICD-10-CM

## 2013-08-05 DIAGNOSIS — T148 Other injury of unspecified body region: Secondary | ICD-10-CM

## 2013-08-05 DIAGNOSIS — W57XXXA Bitten or stung by nonvenomous insect and other nonvenomous arthropods, initial encounter: Secondary | ICD-10-CM | POA: Insufficient documentation

## 2013-08-05 MED ORDER — SULFAMETHOXAZOLE-TRIMETHOPRIM 800-160 MG PO TABS: 1.0000 | ORAL_TABLET | Freq: Two times a day (BID) | ORAL | Status: DC

## 2013-08-05 MED ORDER — SERTRALINE HCL 50 MG PO TABS: 50.0000 mg | ORAL_TABLET | Freq: Every day | ORAL | Status: DC

## 2013-08-05 MED ORDER — TRIAMCINOLONE ACETONIDE 0.5 % EX CREA: 1.0000 "application " | TOPICAL_CREAM | Freq: Two times a day (BID) | CUTANEOUS | Status: DC

## 2013-08-05 NOTE — Progress Notes (Signed)
  Subjective:    CC: Bug bites  HPI: Bug bite: Present for the last several days, on the arms, no other locations, itchy, and slightly painful, somewhat reddish. No muscle aches, body aches.  Right shoulder pain: Localized over the deltoid, worse with overhead activities, also worse with changes in weather, has been to 6 weeks plus a physician directed home exercises and NSAIDs without any improvement. Desires MRI.  Bilateral foot pain: Desires custom orthotics.  Preventative measures: Due for cervical cancer screening.  Past medical history, Surgical history, Family history not pertinant except as noted below, Social history, Allergies, and medications have been entered into the medical record, reviewed, and no changes needed.   Review of Systems: No fevers, chills, night sweats, weight loss, chest pain, or shortness of breath.   Objective:    General: Well Developed, well nourished, and in no acute distress.  Neuro: Alert and oriented x3, extra-ocular muscles intact, sensation grossly intact.  HEENT: Normocephalic, atraumatic, pupils equal round reactive to light, neck supple, no masses, no lymphadenopathy, thyroid nonpalpable.  Skin: Warm and dry, multiple bug bites noted on both anterior arms, there is surrounding erythema without induration.  Cardiac: Regular rate and rhythm, no murmurs rubs or gallops, no lower extremity edema.  Respiratory: Clear to auscultation bilaterally. Not using accessory muscles, speaking in full sentences.  Impression and Recommendations:

## 2013-08-05 NOTE — Assessment & Plan Note (Signed)
Persistent symptoms for several months despite physician directed home rehabilitation for greater than 6 weeks. X-ray, MRI. Return to go over results.

## 2013-08-05 NOTE — Assessment & Plan Note (Signed)
Topical triamcinolone, also giving Septra as there does appear to be infectious component.

## 2013-08-05 NOTE — Assessment & Plan Note (Signed)
Return for Pap smear.

## 2013-08-05 NOTE — Assessment & Plan Note (Signed)
Continue sertraline, doing well.

## 2013-08-10 ENCOUNTER — Telehealth: Payer: Self-pay | Admitting: *Deleted

## 2013-08-10 NOTE — Telephone Encounter (Signed)
No prior auth required. Alejandra George, CMA 

## 2013-08-11 ENCOUNTER — Ambulatory Visit (HOSPITAL_BASED_OUTPATIENT_CLINIC_OR_DEPARTMENT_OTHER)
Admission: RE | Admit: 2013-08-11 | Discharge: 2013-08-11 | Disposition: A | Discharge status: Home / Self Care | Payer: BC Managed Care – PPO | Source: Ambulatory Visit | Attending: Sports Medicine | Admitting: Sports Medicine

## 2013-08-11 DIAGNOSIS — M25511 Pain in right shoulder: Secondary | ICD-10-CM

## 2013-08-11 DIAGNOSIS — M79609 Pain in unspecified limb: Secondary | ICD-10-CM | POA: Insufficient documentation

## 2013-08-11 DIAGNOSIS — R5383 Other fatigue: Secondary | ICD-10-CM

## 2013-08-11 DIAGNOSIS — M25519 Pain in unspecified shoulder: Secondary | ICD-10-CM | POA: Insufficient documentation

## 2013-08-11 DIAGNOSIS — R5381 Other malaise: Secondary | ICD-10-CM | POA: Insufficient documentation

## 2013-08-18 ENCOUNTER — Ambulatory Visit (INDEPENDENT_AMBULATORY_CARE_PROVIDER_SITE_OTHER): Payer: BC Managed Care – PPO | Admitting: Sports Medicine

## 2013-08-18 ENCOUNTER — Encounter: Payer: Self-pay | Admitting: Sports Medicine

## 2013-08-18 VITALS — BP 104/59 | HR 68 | Ht 66.0 in | Wt 142.0 lb

## 2013-08-18 DIAGNOSIS — M533 Sacrococcygeal disorders, not elsewhere classified: Secondary | ICD-10-CM

## 2013-08-18 NOTE — Progress Notes (Signed)

## 2013-08-18 NOTE — Assessment & Plan Note (Signed)
Administrator as Above.

## 2013-08-24 ENCOUNTER — Encounter: Payer: Self-pay | Admitting: Sports Medicine

## 2013-08-31 ENCOUNTER — Encounter: Payer: Self-pay | Admitting: Sports Medicine

## 2013-08-31 ENCOUNTER — Ambulatory Visit (INDEPENDENT_AMBULATORY_CARE_PROVIDER_SITE_OTHER): Payer: BC Managed Care – PPO | Admitting: Sports Medicine

## 2013-08-31 ENCOUNTER — Other Ambulatory Visit (HOSPITAL_COMMUNITY)
Admission: RE | Admit: 2013-08-31 | Discharge: 2013-08-31 | Disposition: A | Discharge status: Home / Self Care | Payer: BC Managed Care – PPO | Source: Ambulatory Visit | Attending: Sports Medicine | Admitting: Sports Medicine

## 2013-08-31 VITALS — BP 112/70 | HR 82 | Ht 66.0 in | Wt 145.0 lb

## 2013-08-31 DIAGNOSIS — Z124 Encounter for screening for malignant neoplasm of cervix: Secondary | ICD-10-CM | POA: Insufficient documentation

## 2013-08-31 DIAGNOSIS — Z113 Encounter for screening for infections with a predominantly sexual mode of transmission: Secondary | ICD-10-CM | POA: Insufficient documentation

## 2013-08-31 DIAGNOSIS — R6889 Other general symptoms and signs: Secondary | ICD-10-CM

## 2013-08-31 DIAGNOSIS — M533 Sacrococcygeal disorders, not elsewhere classified: Secondary | ICD-10-CM

## 2013-08-31 DIAGNOSIS — IMO0002 Reserved for concepts with insufficient information to code with codable children: Secondary | ICD-10-CM

## 2013-08-31 DIAGNOSIS — Z01419 Encounter for gynecological examination (general) (routine) without abnormal findings: Secondary | ICD-10-CM

## 2013-08-31 DIAGNOSIS — Z0000 Encounter for general adult medical examination without abnormal findings: Secondary | ICD-10-CM

## 2013-08-31 MED ORDER — DICLOFENAC SODIUM 2 % TD SOLN: 2.0000 | Freq: Two times a day (BID) | TRANSDERMAL | Status: DC

## 2013-08-31 NOTE — Progress Notes (Signed)
  Subjective:    CC: Complete physical.   HPI:  Alejandra George is a very pleasant 23 year old female, she has a history of hep C, with negative viral load at this time. I have injected her sacroiliac joint in the past, she tells me this is doing well, she would like some topical NSAIDs that she can decrease her use of tramadol. Otherwise very happy.  Past medical history, Surgical history, Family history not pertinant except as noted below, Social history, Allergies, and medications have been entered into the medical record, reviewed, and no changes needed.   Review of Systems: No headache, visual changes, nausea, vomiting, diarrhea, constipation, dizziness, abdominal pain, skin rash, fevers, chills, night sweats, swollen lymph nodes, weight loss, chest pain, body aches, joint swelling, muscle aches, shortness of breath, mood changes, visual or auditory hallucinations.  Objective:    General: Well Developed, well nourished, and in no acute distress.  Neuro: Alert and oriented x3, extra-ocular muscles intact, sensation grossly intact. Cranial nerves II through XII are intact, motor, sensory, and coordinative functions are all intact. HEENT: Normocephalic, atraumatic, pupils equal round reactive to light, neck supple, no masses, no lymphadenopathy, thyroid nonpalpable. Oropharynx, nasopharynx, external ear canals are unremarkable. Skin: Warm and dry, no rashes noted.  Cardiac: Regular rate and rhythm, no murmurs rubs or gallops.  Respiratory: Clear to auscultation bilaterally. Not using accessory muscles, speaking in full sentences.  Abdominal: Soft, nontender, nondistended, positive bowel sounds, no masses, no organomegaly.  Musculoskeletal: Shoulder, elbow, wrist, hip, knee, ankle stable, and with full range of motion. Genital: Vulva, vault, cervix, and adnexa are unremarkable, no cervical motion tenderness, no discharge, swabs taken.  Impression and Recommendations:    The patient was counselled,  risk factors were discussed, anticipatory guidance given.

## 2013-08-31 NOTE — Assessment & Plan Note (Addendum)
Recheck and Pap smear.

## 2013-08-31 NOTE — Assessment & Plan Note (Signed)
Adding topical diclofenac.

## 2013-08-31 NOTE — Assessment & Plan Note (Signed)
Complete physical with Pap as above.

## 2013-09-02 LAB — CYTOLOGY - PAP

## 2013-10-29 ENCOUNTER — Other Ambulatory Visit: Payer: Self-pay

## 2013-11-15 ENCOUNTER — Encounter: Payer: Self-pay | Admitting: Sports Medicine

## 2013-12-20 ENCOUNTER — Encounter: Payer: Self-pay | Admitting: Sports Medicine

## 2013-12-20 ENCOUNTER — Ambulatory Visit (INDEPENDENT_AMBULATORY_CARE_PROVIDER_SITE_OTHER): Payer: BC Managed Care – PPO | Admitting: Sports Medicine

## 2013-12-20 VITALS — BP 117/71 | HR 76 | Temp 98.3°F | Ht 66.0 in | Wt 150.0 lb

## 2013-12-20 DIAGNOSIS — F418 Other specified anxiety disorders: Secondary | ICD-10-CM | POA: Diagnosis not present

## 2013-12-20 DIAGNOSIS — N912 Amenorrhea, unspecified: Secondary | ICD-10-CM | POA: Diagnosis not present

## 2013-12-20 DIAGNOSIS — J0100 Acute maxillary sinusitis, unspecified: Secondary | ICD-10-CM | POA: Diagnosis not present

## 2013-12-20 LAB — POCT URINE PREGNANCY: Preg Test, Ur: NEGATIVE

## 2013-12-20 MED ORDER — AZITHROMYCIN 250 MG PO TABS: ORAL_TABLET | ORAL | Status: DC

## 2013-12-20 MED ORDER — PREDNISONE 50 MG PO TABS: 50.0000 mg | ORAL_TABLET | Freq: Every day | ORAL | Status: DC

## 2013-12-20 MED ORDER — SERTRALINE HCL 50 MG PO TABS: 75.0000 mg | ORAL_TABLET | Freq: Every day | ORAL | Status: DC

## 2013-12-20 MED ORDER — AZITHROMYCIN 250 MG PO TABS
ORAL_TABLET | ORAL | Status: DC
Start: 2013-12-20 — End: 2014-05-23

## 2013-12-20 NOTE — Assessment & Plan Note (Signed)
Significant increase in depressive symptoms perimenstrual. This likely represents an element of PMDD. Increasing Zoloft ot 75mg  daily. No SI/HI. Return in 1 month.

## 2013-12-20 NOTE — Assessment & Plan Note (Signed)
Missed period, urine pregnancy test is negative. Azithromycin, prednisone, continue Nasacort. Return to see me if no better in a week and a half.

## 2013-12-20 NOTE — Progress Notes (Signed)
  Subjective:    CC: Sick  HPI: Alejandra George is a very pleasant 23 year old female, she comes in with a several day history of sinus pressure, pain, nasal discharge and congestion, symptoms are moderate, persistent, no sore throat. She also tells me her period is several days late.  Depression: Significantly worse in the days leading to her cycles, no suicidal or homicidal ideation, doing well on 50 mg of Zoloft but is amenable to increasing the dose.  Past medical history, Surgical history, Family history not pertinant except as noted below, Social history, Allergies, and medications have been entered into the medical record, reviewed, and no changes needed.   Review of Systems: No fevers, chills, night sweats, weight loss, chest pain, or shortness of breath.   Objective:    General: Well Developed, well nourished, and in no acute distress.  Neuro: Alert and oriented x3, extra-ocular muscles intact, sensation grossly intact.  HEENT: Normocephalic, atraumatic, pupils equal round reactive to light, neck supple, no masses, no lymphadenopathy, thyroid nonpalpable. Oropharynx, nasopharynx, external ear canals are unremarkable, she does have a nasal voice. Skin: Warm and dry, no rashes. Cardiac: Regular rate and rhythm, no murmurs rubs or gallops, no lower extremity edema.  Respiratory: Clear to auscultation bilaterally. Not using accessory muscles, speaking in full sentences.  Impression and Recommendations:

## 2013-12-31 ENCOUNTER — Telehealth: Payer: Self-pay

## 2013-12-31 MED ORDER — ESCITALOPRAM OXALATE 10 MG PO TABS: 10.0000 mg | ORAL_TABLET | Freq: Every day | ORAL | Status: DC

## 2013-12-31 NOTE — Telephone Encounter (Signed)
Lovena Le saw her therapist Luiz Blare, LCSW [address] [place] [address] Tel: [phone] Fax: [phone].  Temima reports her therapist would rather her switch to Lexapro 10 mg daily instead of increasing her Zoloft. Please advise.

## 2013-12-31 NOTE — Telephone Encounter (Signed)
That's up to [facility], she has wanted to stay on zoloft in the past and I have no preference between the two.  Please have her let me know.

## 2013-12-31 NOTE — Telephone Encounter (Signed)
I called pt and she does want to Try the Lexapro 10mg   HOWEVER , she does also needs to know how to wean herself off the Zoloft. Thanks, [employer]

## 2013-12-31 NOTE — Telephone Encounter (Signed)
She is only on 50 mg so go one half tab daily for a week then discontinue. She can go ahead and start Lexapro 10 now.

## 2014-01-03 NOTE — Telephone Encounter (Signed)
Patient advised.

## 2014-05-04 ENCOUNTER — Other Ambulatory Visit: Payer: Self-pay | Admitting: Sports Medicine

## 2014-05-23 ENCOUNTER — Ambulatory Visit (INDEPENDENT_AMBULATORY_CARE_PROVIDER_SITE_OTHER): Payer: BLUE CROSS/BLUE SHIELD | Admitting: Physician Assistant

## 2014-05-23 ENCOUNTER — Encounter: Payer: Self-pay | Admitting: Physician Assistant

## 2014-05-23 VITALS — BP 102/66 | HR 96 | Temp 98.4°F | Ht 66.0 in | Wt 146.0 lb

## 2014-05-23 DIAGNOSIS — J0100 Acute maxillary sinusitis, unspecified: Secondary | ICD-10-CM | POA: Diagnosis not present

## 2014-05-23 DIAGNOSIS — J4521 Mild intermittent asthma with (acute) exacerbation: Secondary | ICD-10-CM | POA: Diagnosis not present

## 2014-05-23 DIAGNOSIS — J209 Acute bronchitis, unspecified: Secondary | ICD-10-CM

## 2014-05-23 DIAGNOSIS — J45901 Unspecified asthma with (acute) exacerbation: Secondary | ICD-10-CM | POA: Insufficient documentation

## 2014-05-23 MED ORDER — IPRATROPIUM-ALBUTEROL 0.5-2.5 (3) MG/3ML IN SOLN
3.0000 mL | Freq: Once | RESPIRATORY_TRACT | Status: AC
  Administered 2014-05-23: 3 mL via RESPIRATORY_TRACT

## 2014-05-23 MED ORDER — PREDNISONE 20 MG PO TABS: ORAL_TABLET | ORAL | Status: DC

## 2014-05-23 MED ORDER — ALBUTEROL SULFATE (2.5 MG/3ML) 0.083% IN NEBU: 2.5000 mg | INHALATION_SOLUTION | RESPIRATORY_TRACT | Status: DC | PRN

## 2014-05-23 MED ORDER — AZITHROMYCIN 250 MG PO TABS: ORAL_TABLET | ORAL | Status: DC

## 2014-05-23 NOTE — Progress Notes (Signed)
   Subjective:    Patient ID: Alejandra George, female    DOB: [birth year], 24 y.o.   MRN: [id number]  HPI  Pt presents to the clinic with sinus pressure, cough, SOB, wheezing, and low grade fever. Symptoms present for about 3 wks. Hx of asthma but not taking any maintenance inhalers. Taking robatussin and decongestants. Finished biaxin given at urgent care about 2 weeks ago. Using albuterol inhaler a few times a day. No recent fever, body aches. She feels like her cough and SOB are worsening. She feels very run down.   Review of Systems  All other systems reviewed and are negative.      Objective:   Physical Exam  Constitutional: She is oriented to person, place, and time. She appears well-developed and well-nourished.  HENT:  Head: Normocephalic and atraumatic.  Right Ear: External ear normal.  Left Ear: External ear normal.  Mouth/Throat: Oropharynx is clear and moist.  Eyes: Conjunctivae are normal. Right eye exhibits no discharge. Left eye exhibits no discharge.  Neck: Normal range of motion. Neck supple.  Cardiovascular: Normal rate, regular rhythm and normal heart sounds.   Pulmonary/Chest:  Excessive coughing with deep breathing during auscultation.  Some bilateral scattered wheezing, no rhonchi.   Lymphadenopathy:    She has no cervical adenopathy.  Neurological: She is alert and oriented to person, place, and time.  Psychiatric: She has a normal mood and affect. Her behavior is normal.          Assessment & Plan:  Acute maxillary sinusitis/acute bronchitis/asthma exacerbation- given duoneb in office with improvement today. Nebulizer is broken at home. Filled out paperwork for a new one. Solution sent to pharmacy. Discussed to use every 2-4 hours as needed for next 2-4 days then taper off. Prednisone taper given. zpak given. Follow up if not improving. Hx of being on qvar for maintance. If still feeling need for albuterol may need to restart ICS.

## 2014-06-04 ENCOUNTER — Other Ambulatory Visit: Payer: Self-pay | Admitting: Sports Medicine

## 2014-06-29 ENCOUNTER — Telehealth: Payer: Self-pay | Admitting: *Deleted

## 2014-06-29 ENCOUNTER — Other Ambulatory Visit: Payer: Self-pay | Admitting: Sports Medicine

## 2014-06-29 DIAGNOSIS — M7918 Myalgia, other site: Secondary | ICD-10-CM

## 2014-06-29 NOTE — Telephone Encounter (Signed)
Pt left vm asking for a refill on tramadol.  Is this appropriate? Please advise.

## 2014-07-01 MED ORDER — TRAMADOL HCL 50 MG PO TABS: ORAL_TABLET | ORAL | Status: DC

## 2014-07-01 NOTE — Telephone Encounter (Signed)
Prescription will be in box

## 2014-07-29 ENCOUNTER — Encounter: Payer: Self-pay | Admitting: Sports Medicine

## 2014-07-29 ENCOUNTER — Ambulatory Visit (INDEPENDENT_AMBULATORY_CARE_PROVIDER_SITE_OTHER): Payer: BLUE CROSS/BLUE SHIELD

## 2014-07-29 ENCOUNTER — Ambulatory Visit (INDEPENDENT_AMBULATORY_CARE_PROVIDER_SITE_OTHER): Payer: BLUE CROSS/BLUE SHIELD | Admitting: Sports Medicine

## 2014-07-29 VITALS — BP 117/75 | HR 80 | Ht 65.0 in | Wt 145.0 lb

## 2014-07-29 DIAGNOSIS — M609 Myositis, unspecified: Secondary | ICD-10-CM

## 2014-07-29 DIAGNOSIS — S134XXA Sprain of ligaments of cervical spine, initial encounter: Secondary | ICD-10-CM | POA: Diagnosis not present

## 2014-07-29 DIAGNOSIS — M542 Cervicalgia: Secondary | ICD-10-CM | POA: Diagnosis not present

## 2014-07-29 DIAGNOSIS — M791 Myalgia: Secondary | ICD-10-CM | POA: Diagnosis not present

## 2014-07-29 DIAGNOSIS — IMO0001 Reserved for inherently not codable concepts without codable children: Secondary | ICD-10-CM

## 2014-07-29 DIAGNOSIS — M533 Sacrococcygeal disorders, not elsewhere classified: Secondary | ICD-10-CM

## 2014-07-29 DIAGNOSIS — S73191A Other sprain of right hip, initial encounter: Secondary | ICD-10-CM | POA: Diagnosis not present

## 2014-07-29 DIAGNOSIS — B182 Chronic viral hepatitis C: Secondary | ICD-10-CM

## 2014-07-29 NOTE — Assessment & Plan Note (Addendum)
Pain today's referable predominantly to the right trochanteric bursa. Trochanter bursa injection today under ultrasound guidance, with some medication placed superficial to the gluteus medius tendon. Rehabilitation exercises given.
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
## 2014-08-01 ENCOUNTER — Other Ambulatory Visit: Payer: Self-pay | Admitting: Sports Medicine

## 2014-08-01 ENCOUNTER — Ambulatory Visit (INDEPENDENT_AMBULATORY_CARE_PROVIDER_SITE_OTHER): Payer: BLUE CROSS/BLUE SHIELD | Admitting: Sports Medicine

## 2014-08-01 VITALS — BP 120/82 | HR 78 | Temp 98.0°F | Wt 145.0 lb

## 2014-08-01 DIAGNOSIS — Z202 Contact with and (suspected) exposure to infections with a predominantly sexual mode of transmission: Secondary | ICD-10-CM | POA: Diagnosis not present

## 2014-08-01 LAB — POCT URINE PREGNANCY: Preg Test, Ur: NEGATIVE
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
## 2014-08-01 NOTE — Assessment & Plan Note (Signed)
Laboratory testing as above.

## 2014-08-02 LAB — HSV(HERPES SMPLX)ABS-I+II(IGG+IGM)-BLD
HSV 1 Glycoprotein G Ab, IgG: <0.1 IV
HSV 2 Glycoprotein G Ab, IgG: <0.1 IV
Herpes Simplex Vrs I&II-IgM Ab (EIA): 0.67 INDEX

## 2014-08-02 LAB — HEPATITIS PANEL, ACUTE
HCV Ab: REACTIVE — AB
Hep A IgM: NONREACTIVE
Hep B C IgM: NONREACTIVE
Hepatitis B Surface Ag: NEGATIVE

## 2014-08-02 LAB — HEPATITIS C RNA QUANTITATIVE: HCV Quantitative: NOT DETECTED IU/mL (ref <15)

## 2014-08-02 LAB — HIV ANTIBODY (ROUTINE TESTING W REFLEX): HIV 1&2 Ab, 4th Generation: NONREACTIVE

## 2014-08-02 LAB — RPR

## 2014-08-03 ENCOUNTER — Telehealth: Payer: Self-pay | Admitting: Sports Medicine

## 2014-08-03 NOTE — Telephone Encounter (Signed)
Pt called clinic today, states she was treated at [facility] ED yesterday (08/02/14) for a finger laceration. Needs to make a nurse visit for suture removal, Pt cut her finger while trying to cut an [place]. Pt was added onto schedule. Will route to PCP.

## 2014-08-03 NOTE — Telephone Encounter (Signed)
Give it at least 5 days before removing sutures

## 2014-08-04 LAB — GC/CHLAMYDIA PROBE AMP, URINE
Chlamydia, Swab/Urine, PCR: NEGATIVE
GC Probe Amp, Urine: NEGATIVE

## 2014-08-09 ENCOUNTER — Ambulatory Visit (INDEPENDENT_AMBULATORY_CARE_PROVIDER_SITE_OTHER): Payer: BLUE CROSS/BLUE SHIELD | Admitting: Sports Medicine

## 2014-08-09 VITALS — BP 107/66 | HR 68 | Ht 65.0 in | Wt 146.0 lb

## 2014-08-09 DIAGNOSIS — S61219D Laceration without foreign body of unspecified finger without damage to nail, subsequent encounter: Secondary | ICD-10-CM

## 2014-08-09 NOTE — Progress Notes (Signed)
   Subjective:    Patient ID: Alejandra George, female    DOB: 09-02-1990, 24 y.o.   MRN: [id number] Pt in today to have 2 sutures removed from her left pointer finger.  Sutures removed without any complications.  Pt's incision looked great; no scabbing, no swelling.    Beatris Ship, CMA HPI    Review of Systems     Objective:   Physical Exam        Assessment & Plan:

## 2014-09-05 ENCOUNTER — Ambulatory Visit (INDEPENDENT_AMBULATORY_CARE_PROVIDER_SITE_OTHER): Payer: BLUE CROSS/BLUE SHIELD | Admitting: Sports Medicine

## 2014-09-05 ENCOUNTER — Encounter: Payer: Self-pay | Admitting: Sports Medicine

## 2014-09-05 ENCOUNTER — Other Ambulatory Visit: Payer: Self-pay | Admitting: Sports Medicine

## 2014-09-05 VITALS — BP 125/82 | HR 90 | Ht 66.0 in | Wt 145.0 lb

## 2014-09-05 DIAGNOSIS — H6691 Otitis media, unspecified, right ear: Secondary | ICD-10-CM | POA: Insufficient documentation

## 2014-09-05 DIAGNOSIS — H65191 Other acute nonsuppurative otitis media, right ear: Secondary | ICD-10-CM

## 2014-09-05 DIAGNOSIS — Z0000 Encounter for general adult medical examination without abnormal findings: Secondary | ICD-10-CM

## 2014-09-05 MED ORDER — AZITHROMYCIN 250 MG PO TABS: ORAL_TABLET | ORAL | Status: DC

## 2014-09-05 NOTE — Progress Notes (Signed)
  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  She also is complaining of pain in her right ear, moderate, persistent without radiation, no upper respiratory symptoms, minimal change in hearing, no drainage from the ear.  Past medical history, Surgical history, Family history not pertinant except as noted below, Social history, Allergies, and medications have been entered into the medical record, reviewed, and no changes needed.   Review of Systems: No fevers, chills, night sweats, weight loss, chest pain, or shortness of breath.   Objective:    General: Well Developed, well nourished, and in no acute distress.  Neuro: Alert and oriented x3, extra-ocular muscles intact, sensation grossly intact.  HEENT: Normocephalic, atraumatic, pupils equal round reactive to light, neck supple, no masses, no lymphadenopathy, thyroid nonpalpable. Or pharynx, nasopharynx unremarkable, right ear canal shows a erythematous and dull tympanic membranes Skin: Warm and dry, no rashes. Cardiac: Regular rate and rhythm, no murmurs rubs or gallops, no lower extremity edema.  Respiratory: Clear to auscultation bilaterally. Not using accessory muscles, speaking in full sentences.  Impression and Recommendations:    I spent 25 minutes with this patient, greater than 50% was face-to-face time counseling regarding the above diagnoses

## 2014-09-05 NOTE — Assessment & Plan Note (Signed)
Azithromycin

## 2014-09-05 NOTE — Assessment & Plan Note (Signed)
Quantiferon Gold ordered. Filled out forms for work

## 2014-09-08 ENCOUNTER — Ambulatory Visit (INDEPENDENT_AMBULATORY_CARE_PROVIDER_SITE_OTHER): Payer: BLUE CROSS/BLUE SHIELD | Admitting: Sports Medicine

## 2014-09-08 ENCOUNTER — Encounter: Payer: Self-pay | Admitting: Sports Medicine

## 2014-09-08 VITALS — BP 120/75 | HR 98 | Ht 66.0 in | Wt 145.0 lb

## 2014-09-08 DIAGNOSIS — Z0000 Encounter for general adult medical examination without abnormal findings: Secondary | ICD-10-CM | POA: Diagnosis not present

## 2014-09-08 DIAGNOSIS — H6501 Acute serous otitis media, right ear: Secondary | ICD-10-CM | POA: Diagnosis not present

## 2014-09-08 NOTE — Progress Notes (Signed)
  Subjective:    CC:  CPE  HPI:  This is a pleasant 24 year old female, she had a Pap smear 1 year ago that was completely negative, no Pap needed today, she is here for a physical. Still awaiting Quantiferon Gold.  Otitis media: Resolved with azithromycin  Past medical history, Surgical history, Family history not pertinant except as noted below, Social history, Allergies, and medications have been entered into the medical record, reviewed, and no changes needed.   Review of Systems: No headache, visual changes, nausea, vomiting, diarrhea, constipation, dizziness, abdominal pain, skin rash, fevers, chills, night sweats, swollen lymph nodes, weight loss, chest pain, body aches, joint swelling, muscle aches, shortness of breath, mood changes, visual or auditory hallucinations.  Objective:    General: Well Developed, well nourished, and in no acute distress.  Neuro: Alert and oriented x3, extra-ocular muscles intact, sensation grossly intact. Cranial nerves II through XII are intact, motor, sensory, and coordinative functions are all intact. HEENT: Normocephalic, atraumatic, pupils equal round reactive to light, neck supple, no masses, no lymphadenopathy, thyroid nonpalpable. Oropharynx, nasopharynx, external ear canals are unremarkable. Skin: Warm and dry, no rashes noted.  Cardiac: Regular rate and rhythm, no murmurs rubs or gallops.  Respiratory: Clear to auscultation bilaterally. Not using accessory muscles, speaking in full sentences.  Abdominal: Soft, nontender, nondistended, positive bowel sounds, no masses, no organomegaly.  Musculoskeletal: Shoulder, elbow, wrist, hip, knee, ankle stable, and with full range of motion.  Impression and Recommendations:    The patient was counselled, risk factors were discussed, anticipatory guidance given.

## 2014-09-08 NOTE — Assessment & Plan Note (Signed)
Resolved with antibiotics 

## 2014-09-08 NOTE — Assessment & Plan Note (Signed)
Complete physical exam as above. Pap smear last year was negative, no repeat needed for 2 more years. Return as needed.

## 2014-09-09 LAB — QUANTIFERON TB GOLD ASSAY (BLOOD)
Interferon Gamma Release Assay: NEGATIVE
Mitogen value: >10 [IU]/mL
Quantiferon Nil Value: 0.07 [IU]/mL
Quantiferon Tb Ag Minus Nil Value: 0 [IU]/mL
TB Ag value: 0.07 IU/mL

## 2014-09-28 ENCOUNTER — Other Ambulatory Visit: Payer: Self-pay | Admitting: Sports Medicine

## 2014-11-05 ENCOUNTER — Other Ambulatory Visit: Payer: Self-pay | Admitting: Sports Medicine

## 2014-11-06 ENCOUNTER — Other Ambulatory Visit: Payer: Self-pay | Admitting: Sports Medicine

## 2014-11-08 ENCOUNTER — Telehealth: Payer: Self-pay | Admitting: Sports Medicine

## 2014-11-08 ENCOUNTER — Other Ambulatory Visit: Payer: Self-pay

## 2014-11-08 MED ORDER — ESCITALOPRAM OXALATE 10 MG PO TABS: 10.0000 mg | ORAL_TABLET | Freq: Every day | ORAL | Status: DC

## 2014-11-08 NOTE — Telephone Encounter (Signed)
Sent in another refill.

## 2014-11-08 NOTE — Telephone Encounter (Signed)
Patient called adv that her pharmacy denied prescription twice and our office stated that we electronically sent Lexapro yesterday. Patient is not sure what is going on and wants to know if a nurse can call her pharmacy. Thanks

## 2015-01-04 ENCOUNTER — Ambulatory Visit: Payer: Self-pay | Admitting: Physician Assistant

## 2015-01-04 ENCOUNTER — Ambulatory Visit (INDEPENDENT_AMBULATORY_CARE_PROVIDER_SITE_OTHER): Payer: BLUE CROSS/BLUE SHIELD | Admitting: Sports Medicine

## 2015-01-04 VITALS — BP 123/74 | HR 79 | Temp 97.9°F | Resp 16 | Wt 144.2 lb

## 2015-01-04 DIAGNOSIS — J040 Acute laryngitis: Secondary | ICD-10-CM

## 2015-01-04 MED ORDER — MOMETASONE FUROATE 50 MCG/ACT NA SUSP: NASAL | Status: DC

## 2015-01-04 NOTE — Assessment & Plan Note (Signed)
Over-the-counter decongestants,  Nasonex, hydration. Out of work for one day.

## 2015-01-04 NOTE — Progress Notes (Signed)
  Subjective:    CC: feeling sick  HPI: For the past several days this pleasant when he 24-year-old female has had increasing sore throat, runny nose, ears feel clogged, diarrhea without blood, and overall malaise. 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 No nausea or vomiting, no hematochezia, no melena, symptoms are fairly stable.  Past medical history, Surgical history, Family history not pertinant except as noted below, Social history, Allergies, and medications have been entered into the medical record, reviewed, and no changes needed.   Review of Systems: No fevers, chills, night sweats, weight loss, chest pain, or shortness of breath.   Objective:    General: Well Developed, well nourished, and in no acute distress.  Neuro: Alert and oriented x3, extra-ocular muscles intact, sensation grossly intact.  HEENT: Normocephalic, atraumatic, pupils equal round reactive to light, neck supple, no masses, no lymphadenopathy, thyroid nonpalpable. Oropharynx shows erythematous mucosa consistent with postnasal drip syndrome, nasopharynx and ear canals are unremarkable. Skin: Warm and dry, no rashes. Cardiac: Regular rate and rhythm, no murmurs rubs or gallops, no lower extremity edema.  Respiratory: Clear to auscultation bilaterally. Not using accessory muscles, speaking in full sentences. Abdomen: Soft, nontender, nondistended, normal bowel sounds, palpable masses, no guarding, no rigidity.  Impression and Recommendations:

## 2015-01-04 NOTE — Patient Instructions (Signed)
Laryngitis  Laryngitis is inflammation of your vocal cords. This causes hoarseness, coughing, loss of voice, sore throat, or a dry throat. Your vocal cords are two bands of muscles that are found in your throat. When you speak, these cords come together and vibrate. These vibrations come out through your mouth as sound. When your vocal cords are inflamed, your voice sounds different.  Laryngitis can be temporary (acute) or long-term (chronic). Most cases of acute laryngitis improve with time. Chronic laryngitis is laryngitis that lasts for more than three weeks.  CAUSES  Acute laryngitis may be caused by:   A viral infection.   Lots of talking, yelling, or singing. This is also called vocal strain.   Bacterial infections.  Chronic laryngitis may be caused by:   Vocal strain.   Injury to your vocal cords.   Acid reflux (gastroesophageal reflux disease or GERD).   Allergies.   Sinus infection.   Smoking.   Alcohol abuse.   Breathing in chemicals or dust.   Growths on the vocal cords.  RISK FACTORS  Risk factors for laryngitis include:   Smoking.   Alcohol abuse.   Having allergies.  SIGNS AND SYMPTOMS  Symptoms of laryngitis may include:   Low, hoarse voice.   Loss of voice.   Dry cough.   Sore throat.   Stuffy nose.  DIAGNOSIS  Laryngitis may be diagnosed by:   Physical exam.   Throat culture.   Blood test.   Laryngoscopy. This procedure allows your health care provider to look at your vocal cords with a mirror or viewing tube.  TREATMENT  Treatment for laryngitis depends on what is causing it. Usually, treatment involves resting your voice and using medicines to soothe your throat. However, if your laryngitis is caused by a bacterial infection, you may need to take antibiotic medicine. If your laryngitis is caused by a growth, you may need to have a procedure to remove it.  HOME CARE INSTRUCTIONS   Drink enough fluid to keep your urine clear or pale yellow.   Breathe in moist air. Use a  humidifier if you live in a dry climate.   Take medicines only as directed by your health care provider.   If you were prescribed an antibiotic medicine, finish it all even if you start to feel better.   Do not smoke cigarettes or electronic cigarettes. If you need help quitting, ask your health care provider.   Talk as little as possible. Also avoid whispering, which can cause vocal strain.   Write instead of talking. Do this until your voice is back to normal.  SEEK MEDICAL CARE IF:   You have a fever.   You have increasing pain.   You have difficulty swallowing.  SEEK IMMEDIATE MEDICAL CARE IF:   You cough up blood.   You have trouble breathing.     This information is not intended to replace advice given to you by your health care provider. Make sure you discuss any questions you have with your health care provider.     Document Released: 12/31/2004 Document Revised: 01/21/2014 Document Reviewed: 06/15/2013  Elsevier Interactive Patient Education 2016 Elsevier Inc.

## 2015-02-17 ENCOUNTER — Ambulatory Visit (INDEPENDENT_AMBULATORY_CARE_PROVIDER_SITE_OTHER): Payer: BLUE CROSS/BLUE SHIELD | Admitting: Sports Medicine

## 2015-02-17 ENCOUNTER — Ambulatory Visit (INDEPENDENT_AMBULATORY_CARE_PROVIDER_SITE_OTHER): Payer: BLUE CROSS/BLUE SHIELD

## 2015-02-17 VITALS — BP 124/76 | HR 112 | Temp 98.2°F | Resp 18 | Wt 146.0 lb

## 2015-02-17 DIAGNOSIS — J4520 Mild intermittent asthma, uncomplicated: Secondary | ICD-10-CM

## 2015-02-17 DIAGNOSIS — J9811 Atelectasis: Secondary | ICD-10-CM

## 2015-02-17 DIAGNOSIS — B182 Chronic viral hepatitis C: Secondary | ICD-10-CM

## 2015-02-17 DIAGNOSIS — R896 Abnormal cytological findings in specimens from other organs, systems and tissues: Secondary | ICD-10-CM

## 2015-02-17 DIAGNOSIS — Z0000 Encounter for general adult medical examination without abnormal findings: Secondary | ICD-10-CM | POA: Diagnosis not present

## 2015-02-17 DIAGNOSIS — IMO0002 Reserved for concepts with insufficient information to code with codable children: Secondary | ICD-10-CM

## 2015-02-17 MED ORDER — AZITHROMYCIN 250 MG PO TABS: ORAL_TABLET | ORAL | Status: DC

## 2015-02-17 MED ORDER — OSELTAMIVIR PHOSPHATE 75 MG PO CAPS: 75.0000 mg | ORAL_CAPSULE | Freq: Two times a day (BID) | ORAL | Status: DC

## 2015-02-17 NOTE — Assessment & Plan Note (Signed)
Due for a repeat Pap. 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

## 2015-02-17 NOTE — Assessment & Plan Note (Signed)
Patient is to return for Pap and physical.

## 2015-02-17 NOTE — Assessment & Plan Note (Signed)
Rechecking hepatitis viral RNA.

## 2015-02-17 NOTE — Assessment & Plan Note (Signed)
Currently having an influenza-like illness with cough. Since she does have an asthma we will treat this aggressively with azithromycin, Tamiflu. Chest x-ray.

## 2015-02-17 NOTE — Progress Notes (Signed)
  Subjective:    CC: sick  HPI: This is a pleasant 25 year old female, for the past day she's had increasing cough, fevers, chills, muscle aches, and body aches, no sick contacts, shortness of breath. Cough is productive of clear sputum and phlegm. No abdominal pain, nausea, vomiting, skin rash, dysuria.  Hepatitis C: Would like a recheck of viral load, previously her hepatitis C was completely cleared.  Past medical history, Surgical history, Family history not pertinant except as noted below, Social history, Allergies, and medications have been entered into the medical record, reviewed, and no changes needed.   Review of Systems: No fevers, chills, night sweats, weight loss, chest pain, or shortness of breath.   Objective:    General: Well Developed, well nourished, and in no acute distress.  Neuro: Alert and oriented x3, extra-ocular muscles intact, sensation grossly intact.  HEENT: Normocephalic, atraumatic, pupils equal round reactive to light, neck supple, no masses, no lymphadenopathy, thyroid nonpalpable.  Skin: Warm and dry, no rashes. Cardiac: Regular rate and rhythm, no murmurs rubs or gallops, no lower extremity edema.  Respiratory: Clear to auscultation bilaterally. Not using accessory muscles, speaking in full sentences.  coughing in exam room.  Impression and Recommendations:    I spent 25 minutes with this patient, greater than 50% was face-to-face time counseling regarding the above diagnoses

## 2015-04-18 DIAGNOSIS — F330 Major depressive disorder, recurrent, mild: Secondary | ICD-10-CM | POA: Diagnosis not present

## 2015-04-20 DIAGNOSIS — R198 Other specified symptoms and signs involving the digestive system and abdomen: Secondary | ICD-10-CM | POA: Diagnosis not present

## 2015-04-20 DIAGNOSIS — J358 Other chronic diseases of tonsils and adenoids: Secondary | ICD-10-CM | POA: Diagnosis not present

## 2015-05-02 ENCOUNTER — Ambulatory Visit (INDEPENDENT_AMBULATORY_CARE_PROVIDER_SITE_OTHER): Payer: BLUE CROSS/BLUE SHIELD | Admitting: Physician Assistant

## 2015-05-02 ENCOUNTER — Encounter: Payer: Self-pay | Admitting: Physician Assistant

## 2015-05-02 VITALS — BP 131/79 | HR 74 | Ht 66.0 in | Wt 146.0 lb

## 2015-05-02 DIAGNOSIS — N889 Noninflammatory disorder of cervix uteri, unspecified: Secondary | ICD-10-CM

## 2015-05-02 NOTE — Progress Notes (Addendum)
   Subjective:    Patient ID: Alejandra George, female    DOB: 12-29-1990, 25 y.o.   MRN: [id number]  HPI Patient states she thought she may have a retained tampon in her vaginal canal two days ago, and as she was feeling for the tampon, she noticed a "lump" on what she believes was her cervix. She denies abdominal pain, nausea, dysmenorrhea, menorrhagia, or metorrhagia. Periods are regular. Patient is not currently sexually active and has not had intercourse since May 2016.    Review of Systems See HPI.     Objective:   Physical Exam  Constitutional: She appears well-developed and well-nourished.  HENT:  Head: Normocephalic and atraumatic.  Cardiovascular: Normal rate, regular rhythm and normal heart sounds.   Pulmonary/Chest: Effort normal and breath sounds normal.  Abdominal: Soft. Bowel sounds are normal. She exhibits no distension. There is no tenderness. There is no rebound and no guarding.  Genitourinary: Vagina normal and uterus normal. Cervix exhibits friability. No tenderness in the vagina. No vaginal discharge found.          Assessment & Plan:  1. Cervical abnormality- Patient completely asymptomatic. On visual inspection, no masses seen. Reassured patient that she could have potentially palpated cervical os. Pap smear up to date. Last pap was normal in 2015. Encouraged patient to come back in 1 year for repeat pap smear.

## 2015-05-10 DIAGNOSIS — F330 Major depressive disorder, recurrent, mild: Secondary | ICD-10-CM | POA: Diagnosis not present

## 2015-05-18 ENCOUNTER — Other Ambulatory Visit: Payer: Self-pay | Admitting: Sports Medicine

## 2015-05-23 DIAGNOSIS — F330 Major depressive disorder, recurrent, mild: Secondary | ICD-10-CM | POA: Diagnosis not present

## 2015-05-27 ENCOUNTER — Emergency Department
Admission: EM | Admit: 2015-05-27 | Discharge: 2015-05-27 | Disposition: A | Discharge status: Home / Self Care | Payer: BLUE CROSS/BLUE SHIELD | Source: Home / Self Care | Attending: Family Medicine | Admitting: Family Medicine

## 2015-05-27 ENCOUNTER — Encounter: Payer: Self-pay | Admitting: *Deleted

## 2015-05-27 DIAGNOSIS — J069 Acute upper respiratory infection, unspecified: Secondary | ICD-10-CM

## 2015-05-27 MED ORDER — AZITHROMYCIN 250 MG PO TABS: 250.0000 mg | ORAL_TABLET | Freq: Every day | ORAL | Status: DC

## 2015-05-27 NOTE — ED Notes (Signed)
Pt c/o chills, sweats, fever 102, and productive cough x 1 day.

## 2015-05-27 NOTE — Discharge Instructions (Signed)
You may take 400-600mg  Ibuprofen (Motrin) every 6-8 hours for fever and pain  Alternate with Tylenol  You may take 500mg  Tylenol every 4-6 hours as needed for fever and pain  Follow-up with your primary care provider next week for recheck of symptoms if not improving.  Be sure to drink plenty of fluids and rest, at least 8hrs of sleep a night, preferably more while you are sick. Return urgent care or go to closest ER if you cannot keep down fluids/signs of dehydration, fever not reducing with Tylenol, difficulty breathing/wheezing, stiff neck, worsening condition, or other concerns (see below)  Please take antibiotics as prescribed and be sure to complete entire course even if you start to feel better to ensure infection does not come back.   Cool Mist Vaporizers Vaporizers may help relieve the symptoms of a cough and cold. They add moisture to the air, which helps mucus to become thinner and less sticky. This makes it easier to breathe and cough up secretions. Cool mist vaporizers do not cause serious burns like hot mist vaporizers, which may also be called steamers or humidifiers. Vaporizers have not been proven to help with colds. You should not use a vaporizer if you are allergic to mold. HOME CARE INSTRUCTIONS  Follow the package instructions for the vaporizer.  Do not use anything other than distilled water in the vaporizer.  Do not run the vaporizer all of the time. This can cause mold or bacteria to grow in the vaporizer.  Clean the vaporizer after each time it is used.  Clean and dry the vaporizer well before storing it.  Stop using the vaporizer if worsening respiratory symptoms develop.   This information is not intended to replace advice given to you by your health care provider. Make sure you discuss any questions you have with your health care provider.   Document Released: 09/28/2003 Document Revised: 01/05/2013 Document Reviewed: 05/20/2012 Elsevier Interactive Patient  Education 2016 Elsevier Inc.  Upper Respiratory Infection, Adult Most upper respiratory infections (URIs) are caused by a virus. A URI affects the nose, throat, and upper air passages. The most common type of URI is often called "the common cold." HOME CARE   Take medicines only as told by your doctor.  Gargle warm saltwater or take cough drops to comfort your throat as told by your doctor.  Use a warm mist humidifier or inhale steam from a shower to increase air moisture. This may make it easier to breathe.  Drink enough fluid to keep your pee (urine) clear or pale yellow.  Eat soups and other clear broths.  Have a healthy diet.  Rest as needed.  Go back to work when your fever is gone or your doctor says it is okay.  You may need to stay home longer to avoid giving your URI to others.  You can also wear a face mask and wash your hands often to prevent spread of the virus.  Use your inhaler more if you have asthma.  Do not use any tobacco products, including cigarettes, chewing tobacco, or electronic cigarettes. If you need help quitting, ask your doctor. GET HELP IF:  You are getting worse, not better.  Your symptoms are not helped by medicine.  You have chills.  You are getting more short of breath.  You have brown or red mucus.  You have yellow or brown discharge from your nose.  You have pain in your face, especially when you bend forward.  You have a fever.  You have puffy (swollen) neck glands.  You have pain while swallowing.  You have white areas in the back of your throat. GET HELP RIGHT AWAY IF:   You have very bad or constant:  Headache.  Ear pain.  Pain in your forehead, behind your eyes, and over your cheekbones (sinus pain).  Chest pain.  You have long-lasting (chronic) lung disease and any of the following:  Wheezing.  Long-lasting cough.  Coughing up blood.  A change in your usual mucus.  You have a stiff neck.  You have  changes in your:  Vision.  Hearing.  Thinking.  Mood. MAKE SURE YOU:   Understand these instructions.  Will watch your condition.  Will get help right away if you are not doing well or get worse.   This information is not intended to replace advice given to you by your health care provider. Make sure you discuss any questions you have with your health care provider.   Document Released: 06/19/2007 Document Revised: 05/17/2014 Document Reviewed: 04/07/2013 Elsevier Interactive Patient Education [employer].

## 2015-05-27 NOTE — ED Provider Notes (Signed)
CSN: [id number]     Arrival date & time 05/27/15  1545 History   First MD Initiated Contact with Patient 05/27/15 1604     Chief Complaint  Patient presents with  . Fever   (Consider location/radiation/quality/duration/timing/severity/associated sxs/prior Treatment) HPI  The pt is a 25yo female presenting to [facility] with c/o gradually worsening body aches, fever Tmax 102, chills and moderately productive cough that started yesterday.  She has taken acetaminophen with only temporary relief of fever. Hx of asthma but she has not used her nebulizer machine yet, she has considered starting to as her chest feels tight. She feels like she cannot get up the phlegm. No sick contacts or recent travel. Denies n/v/d. Denies SOB.   Past Medical History  Diagnosis Date  . Asthma   . Depression   . Anxiety   . History of chicken pox     as a child  . History of polydrug abuse    Past Surgical History  Procedure Laterality Date  . No past surgeries    . Cesarean section  07/24/2011    Procedure: CESAREAN SECTION;  Surgeon: Sharene Butters, MD;  Location: [facility];  Service: Gynecology;  Laterality: N/A;   Family History  Problem Relation Age of Onset  . Diabetes Mother   . Hypertension Mother   . Hypertension Maternal Grandmother   . Diabetes Maternal Grandmother   . Cancer Maternal Grandmother     Breast  . Cancer Maternal Grandfather     Lung   Social History  Substance Use Topics  . Smoking status: Never Smoker   . Smokeless tobacco: Never Used  . Alcohol Use: No   OB History    Gravida Para Term Preterm AB TAB SAB Ectopic Multiple Living   1 1 1  0 0 0 0 0 0 1     Review of Systems  Constitutional: Positive for fever, chills and fatigue.  HENT: Positive for congestion and ear pain ( bilateral). Negative for sore throat, trouble swallowing and voice change.   Respiratory: Positive for cough and chest tightness. Negative for shortness of breath and wheezing.   Cardiovascular: Negative  for chest pain and palpitations.  Gastrointestinal: Negative for nausea, vomiting, abdominal pain and diarrhea.  Musculoskeletal: Positive for myalgias and arthralgias. Negative for back pain.  Skin: Negative for rash.  All other systems reviewed and are negative.   Allergies  Penicillins and Cephalosporins  Home Medications   Prior to Admission medications   Medication Sig Start Date End Date Taking? Authorizing Provider  albuterol (PROVENTIL HFA;VENTOLIN HFA) 108 (90 BASE) MCG/ACT inhaler Inhale 2 puffs into the lungs every 4 (four) hours as needed for wheezing or shortness of breath. 12/25/12   Sean Hommel, DO  albuterol (PROVENTIL) (2.5 MG/3ML) 0.083% nebulizer solution Take 3 mLs (2.5 mg total) by nebulization every 4 (four) hours as needed for wheezing or shortness of breath (please include nebulizer machine, hoses, and mask if needed.). 05/23/14   Jade L Breeback, PA-C  azithromycin (ZITHROMAX) 250 MG tablet Take 1 tablet (250 mg total) by mouth daily. Take first 2 tablets together, then 1 every day until finished. 05/27/15   Noland Fordyce, PA-C  escitalopram (LEXAPRO) 10 MG tablet TAKE 1 TABLET(10 MG) BY MOUTH DAILY 05/18/15   Silverio Decamp, MD  traMADol (ULTRAM) 50 MG tablet 1-2 tabs by mouth Q8 hours, maximum 6 tabs per day. 07/01/14   Silverio Decamp, MD   Meds Ordered and Administered this Visit  Medications -  No data to display  BP 112/77 mmHg  Pulse 84  Temp(Src) 99.3 F (37.4 C) (Oral)  Resp 16  Ht 5\' 6"  (1.676 m)  Wt 146 lb (66.225 kg)  BMI 23.58 kg/m2  SpO2 100%  LMP 05/27/2015 No data found.   Physical Exam  Constitutional: She appears well-developed and well-nourished. No distress.  HENT:  Head: Normocephalic and atraumatic.  Right Ear: Tympanic membrane normal.  Left Ear: Tympanic membrane normal.  Nose: Mucosal edema present.  Mouth/Throat: Uvula is midline, oropharynx is clear and moist and mucous membranes are normal.  Eyes: Conjunctivae are  normal. No scleral icterus.  Neck: Normal range of motion. Neck supple.  Cardiovascular: Normal rate, regular rhythm and normal heart sounds.   Pulmonary/Chest: Effort normal. No respiratory distress. She has wheezes (faint expiratory wheeze). She has no rales. She exhibits no tenderness.  No respiratory distress, able to speak in full sentences w/o difficulty.   Abdominal: Soft. She exhibits no distension and no mass. There is no tenderness. There is no rebound and no guarding.  Musculoskeletal: Normal range of motion.  Neurological: She is alert.  Skin: Skin is warm and dry. She is not diaphoretic.  Nursing note and vitals reviewed.   ED Course  Procedures (including critical care time)  Labs Review Labs Reviewed - No data to display  Imaging Review No results found.    MDM   1. Acute upper respiratory infection    Pt c/o sudden onset fever, chills, cough, chest tightness. Hx of asthma. Faint expiratory wheeze. O2 Sat 100% on RA. No respiratory distress.  Will cover for atypical bacteria Rx: azithromycin Pt states she has plenty of albuterol nebulizer solution at home. Will start to use this evening. Advised pt to use acetaminophen and ibuprofen as needed for fever and pain. Encouraged rest and fluids. F/u with PCP in 7-10 days if not improving, sooner if worsening. Pt verbalized understanding and agreement with tx plan.     Noland Fordyce, PA-C 05/27/15 1739

## 2015-05-30 DIAGNOSIS — J351 Hypertrophy of tonsils: Secondary | ICD-10-CM | POA: Diagnosis not present

## 2015-05-30 DIAGNOSIS — J3501 Chronic tonsillitis: Secondary | ICD-10-CM | POA: Diagnosis not present

## 2015-06-06 DIAGNOSIS — F330 Major depressive disorder, recurrent, mild: Secondary | ICD-10-CM | POA: Diagnosis not present

## 2015-06-19 ENCOUNTER — Other Ambulatory Visit: Payer: Self-pay | Admitting: Sports Medicine

## 2015-06-20 DIAGNOSIS — F330 Major depressive disorder, recurrent, mild: Secondary | ICD-10-CM | POA: Diagnosis not present

## 2015-06-27 ENCOUNTER — Ambulatory Visit: Payer: Self-pay | Admitting: Sports Medicine

## 2015-06-28 ENCOUNTER — Ambulatory Visit (INDEPENDENT_AMBULATORY_CARE_PROVIDER_SITE_OTHER): Payer: BLUE CROSS/BLUE SHIELD | Admitting: Sports Medicine

## 2015-06-28 DIAGNOSIS — F418 Other specified anxiety disorders: Secondary | ICD-10-CM

## 2015-06-28 DIAGNOSIS — M222X1 Patellofemoral disorders, right knee: Secondary | ICD-10-CM | POA: Insufficient documentation

## 2015-06-28 DIAGNOSIS — M791 Myalgia: Secondary | ICD-10-CM | POA: Diagnosis not present

## 2015-06-28 DIAGNOSIS — M7918 Myalgia, other site: Secondary | ICD-10-CM

## 2015-06-28 MED ORDER — ESCITALOPRAM OXALATE 10 MG PO TABS: 10.0000 mg | ORAL_TABLET | Freq: Every day | ORAL | Status: DC

## 2015-06-28 MED ORDER — TRAMADOL HCL 50 MG PO TABS: ORAL_TABLET | ORAL | Status: DC

## 2015-06-28 NOTE — Assessment & Plan Note (Signed)
Refilling Lexapro.

## 2015-06-28 NOTE — Patient Instructions (Signed)
Hip Rehabilitation Protocol:  1.  Side leg raises.  3x30 with no weight, then 3x15 with 2 lb ankle weight, then 3x15 with 5 lb ankle weight 2.  Standing hip rotation.  3x30 with no weight, then 3x15 with 2 lb ankle weight, then 3x15 with 5 lb ankle weight. 3.  Side step ups.  3x30 with no weight, then 3x15 with 5 lbs in backpack, then 3x15 with 10 lbs in backpack. 

## 2015-06-28 NOTE — Progress Notes (Signed)
  Subjective:    CC: Follow-up  HPI:  Depression: Well controlled on Lexapro, needs a refill  Right knee pain: Worse with running, going up and down stairs, squatting, pain is anterior, moderate, persistent, no radiation, no trauma, no swelling, no mechanical symptoms.  Past medical history, Surgical history, Family history not pertinant except as noted below, Social history, Allergies, and medications have been entered into the medical record, reviewed, and no changes needed.   Review of Systems: No fevers, chills, night sweats, weight loss, chest pain, or shortness of breath.   Objective:    General: Well Developed, well nourished, and in no acute distress.  Neuro: Alert and oriented x3, extra-ocular muscles intact, sensation grossly intact.  HEENT: Normocephalic, atraumatic, pupils equal round reactive to light, neck supple, no masses, no lymphadenopathy, thyroid nonpalpable.  Skin: Warm and dry, no rashes. Cardiac: Regular rate and rhythm, no murmurs rubs or gallops, no lower extremity edema.  Respiratory: Clear to auscultation bilaterally. Not using accessory muscles, speaking in full sentences. Right Knee: Normal to inspection with no erythema or effusion or obvious bony abnormalities. Tender to palpation under the medial and lateral patellar facets ROM normal in flexion and extension and lower leg rotation. Ligaments with solid consistent endpoints including ACL, PCL, LCL, MCL. Negative Mcmurray's and provocative meniscal tests. Non painful patellar compression. Patellar and quadriceps tendons unremarkable. Hamstring and quadriceps strength is normal. Right hip abductor strength is markedly weak compared to the left  Impression and Recommendations:    I spent 25 minutes with this patient, greater than 50% was face-to-face time counseling regarding the above diagnoses

## 2015-06-28 NOTE — Assessment & Plan Note (Signed)
With week hip abductor's on the right, hip abductor rehabilitation and patellofemoral pain exercises given. Return in 6 weeks to reevaluate hip strength and knee pain.

## 2015-07-04 DIAGNOSIS — F330 Major depressive disorder, recurrent, mild: Secondary | ICD-10-CM | POA: Diagnosis not present

## 2015-07-25 DIAGNOSIS — F330 Major depressive disorder, recurrent, mild: Secondary | ICD-10-CM | POA: Diagnosis not present

## 2015-08-01 DIAGNOSIS — F330 Major depressive disorder, recurrent, mild: Secondary | ICD-10-CM | POA: Diagnosis not present

## 2015-08-09 ENCOUNTER — Encounter: Payer: Self-pay | Admitting: Sports Medicine

## 2015-08-09 ENCOUNTER — Ambulatory Visit (INDEPENDENT_AMBULATORY_CARE_PROVIDER_SITE_OTHER): Payer: BLUE CROSS/BLUE SHIELD | Admitting: Sports Medicine

## 2015-08-09 VITALS — BP 102/64 | HR 65 | Resp 16 | Ht 66.0 in | Wt 134.7 lb

## 2015-08-09 DIAGNOSIS — F418 Other specified anxiety disorders: Secondary | ICD-10-CM

## 2015-08-09 DIAGNOSIS — Z23 Encounter for immunization: Secondary | ICD-10-CM

## 2015-08-09 DIAGNOSIS — Z0000 Encounter for general adult medical examination without abnormal findings: Secondary | ICD-10-CM

## 2015-08-09 DIAGNOSIS — R896 Abnormal cytological findings in specimens from other organs, systems and tissues: Secondary | ICD-10-CM | POA: Diagnosis not present

## 2015-08-09 DIAGNOSIS — B182 Chronic viral hepatitis C: Secondary | ICD-10-CM

## 2015-08-09 DIAGNOSIS — IMO0002 Reserved for concepts with insufficient information to code with codable children: Secondary | ICD-10-CM

## 2015-08-09 LAB — CBC
HCT: 38.8 % (ref 35.0–45.0)
Hemoglobin: 12.8 g/dL (ref 11.7–15.5)
MCH: 29.4 pg (ref 27.0–33.0)
MCHC: 33 g/dL (ref 32.0–36.0)
MCV: 89 fL (ref 80.0–100.0)
MPV: 10.5 fL (ref 7.5–12.5)
Platelets: 303 10*3/uL (ref 140–400)
RBC: 4.36 MIL/uL (ref 3.80–5.10)
RDW: 14.2 % (ref 11.0–15.0)
WBC: 6.5 10*3/uL (ref 3.8–10.8)

## 2015-08-09 LAB — COMPREHENSIVE METABOLIC PANEL
AST: 13 U/L (ref 10–30)
BUN: 8 mg/dL (ref 7–25)
CO2: 26 mmol/L (ref 20–31)
Chloride: 103 mmol/L (ref 98–110)
Creat: 0.73 mg/dL (ref 0.50–1.10)
Sodium: 138 mmol/L (ref 135–146)

## 2015-08-09 LAB — COMPREHENSIVE METABOLIC PANEL WITH GFR
ALT: 9 U/L (ref 6–29)
Albumin: 4.2 g/dL (ref 3.6–5.1)
Alkaline Phosphatase: 48 U/L (ref 33–115)
Calcium: 9.4 mg/dL (ref 8.6–10.2)
Glucose, Bld: 92 mg/dL (ref 65–99)
Potassium: 3.9 mmol/L (ref 3.5–5.3)
Total Bilirubin: 0.8 mg/dL (ref 0.2–1.2)
Total Protein: 7.2 g/dL (ref 6.1–8.1)

## 2015-08-09 LAB — LIPID PANEL
Cholesterol: 125 mg/dL (ref 125–200)
HDL: 56 mg/dL (ref >=46)
LDL Cholesterol: 57 mg/dL (ref <130)
Total CHOL/HDL Ratio: 2.2 ratio (ref <=5.0)
Triglycerides: 62 mg/dL (ref <150)
VLDL: 12 mg/dL (ref <30)

## 2015-08-09 LAB — HEMOGLOBIN A1C
Hgb A1c MFr Bld: 5.6 % (ref <5.7)
Mean Plasma Glucose: 114 mg/dL

## 2015-08-09 LAB — TSH: TSH: 0.62 mIU/L

## 2015-08-09 MED ORDER — VORTIOXETINE HBR 10 MG PO TABS: 1.0000 | ORAL_TABLET | Freq: Every day | ORAL | 3 refills | Status: DC

## 2015-08-09 NOTE — Assessment & Plan Note (Signed)
Had high-risk HPV detected in 2015, referral to OB/GYN for further management of cervical cancer screening.

## 2015-08-09 NOTE — Assessment & Plan Note (Signed)
Sexual dysfunction with Lexapro, switching to Trintellix

## 2015-08-09 NOTE — Progress Notes (Signed)
  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 She also is seemingly due for cervical cancer screening.  Depression and anxiety: Initially did well on Lexapro however is noting difficulty achieving orgasm and low sex. She is agreeable to switch to something else, overall symptoms are well controlled.  Past medical history, Surgical history, Family history not pertinant except as noted below, Social history, Allergies, and medications have been entered into the medical record, reviewed, and no changes needed.   Review of Systems: No headache, visual changes, nausea, vomiting, diarrhea, constipation, dizziness, abdominal pain, skin rash, fevers, chills, night sweats, swollen lymph nodes, weight loss, chest pain, body aches, joint swelling, muscle aches, shortness of breath, mood changes, visual or auditory hallucinations.  Objective:    General: Well Developed, well nourished, and in no acute distress.  Neuro: Alert and oriented x3, extra-ocular muscles intact, sensation grossly intact. Cranial nerves II through XII are intact, motor, sensory, and coordinative functions are all intact. HEENT: Normocephalic, atraumatic, pupils equal round reactive to light, neck supple, no masses, no lymphadenopathy, thyroid nonpalpable. Oropharynx, nasopharynx, external ear canals are unremarkable. Skin: Warm and dry, no rashes noted.  Cardiac: Regular rate and rhythm, no murmurs rubs or gallops.  Respiratory: Clear to auscultation bilaterally. Not using accessory muscles, speaking in full sentences.  Abdominal: Soft, nontender, nondistended, positive bowel sounds, no masses, no organomegaly.  Musculoskeletal: Shoulder, elbow, wrist, hip, knee, ankle stable, and with full range of motion.  Impression and Recommendations:    The patient was  counselled, risk factors were discussed, anticipatory guidance given.

## 2015-08-09 NOTE — Assessment & Plan Note (Signed)
Annual physical as above, checking routine blood work include lipids, we are also going to check varicella-zoster immunity. She will need her meningococcal and HPV vaccinations today, and will return for follow-ups in the series. She is applying to a massage therapy school. Also going to add hepatitis B surface antibody and Quantiferon Gold testing.

## 2015-08-10 LAB — MEASLES/MUMPS/RUBELLA IMMUNITY
Mumps IgG: 61 AU/mL — ABNORMAL HIGH (ref <9.00)
Rubella: 1.48 {index} — ABNORMAL HIGH (ref <0.90)
Rubeola IgG: 59 AU/mL — ABNORMAL HIGH (ref <25.00)

## 2015-08-10 LAB — VARICELLA ZOSTER ANTIBODY, IGG: Varicella IgG: <135 Index (ref <135.00)

## 2015-08-10 LAB — HEPATITIS B SURFACE ANTIBODY, QUANTITATIVE: Hep B S AB Quant (Post): 1000 m[IU]/mL

## 2015-08-11 LAB — QUANTIFERON TB GOLD ASSAY (BLOOD)
Interferon Gamma Release Assay: NEGATIVE
Mitogen-Nil: >10 IU/mL
Quantiferon Nil Value: 0.02 IU/mL
Quantiferon Tb Ag Minus Nil Value: 0 IU/mL

## 2015-08-14 ENCOUNTER — Ambulatory Visit (INDEPENDENT_AMBULATORY_CARE_PROVIDER_SITE_OTHER): Payer: BLUE CROSS/BLUE SHIELD | Admitting: Sports Medicine

## 2015-08-14 VITALS — BP 91/57 | HR 71 | Temp 98.5°F | Wt 133.0 lb

## 2015-08-14 DIAGNOSIS — Z23 Encounter for immunization: Secondary | ICD-10-CM | POA: Diagnosis not present

## 2015-08-14 NOTE — Progress Notes (Signed)
Pt came into clinic today for her varicella vaccine. 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 Pt tolerated injection in left deltoid well, no immediate complications. Pt paperwork for school has been completed and was given to Pt along with updated immunization record and titers report. No further questions/concerns at this time. [facility] has been updated.

## 2015-08-15 DIAGNOSIS — F330 Major depressive disorder, recurrent, mild: Secondary | ICD-10-CM | POA: Diagnosis not present

## 2015-08-16 ENCOUNTER — Telehealth: Payer: Self-pay | Admitting: *Deleted

## 2015-08-16 NOTE — Telephone Encounter (Signed)
trintellix approved and pt and pharm notified

## 2015-08-16 NOTE — Telephone Encounter (Signed)
Has already tried and failed venlafaxine, duloxetine, Lexapro, Zoloft. These are all in her list of previous medications, please resubmit.

## 2015-08-16 NOTE — Telephone Encounter (Signed)
My mistake. Will resubmit

## 2015-08-16 NOTE — Telephone Encounter (Signed)
Look like this patient has medicaid. Called [place] [facility] and step therapy is required before consideration of coverage for trintellix. Preferred meds are Bupropion, Venlafaxine, Dueloxitine, Mirtazipine and Trazodone. Has to have tried and failed two of these.

## 2015-08-18 ENCOUNTER — Telehealth: Payer: Self-pay | Admitting: Sports Medicine

## 2015-08-18 DIAGNOSIS — F418 Other specified anxiety disorders: Secondary | ICD-10-CM

## 2015-08-18 MED ORDER — VORTIOXETINE HBR 5 MG PO TABS: ORAL_TABLET | ORAL | 3 refills | Status: DC

## 2015-08-18 NOTE — Telephone Encounter (Signed)
Left information on Pt's VM, callback provided for any questions.  

## 2015-08-18 NOTE — Telephone Encounter (Signed)
Done and called in

## 2015-08-18 NOTE — Telephone Encounter (Signed)
Pt called office to see if PCP would approve her trying the Trintellix at 5mg  rather than starting at 10mg . Pt reports her goal is to be on the lowest dose and then eventually not need it anymore. Will route.

## 2015-08-22 ENCOUNTER — Ambulatory Visit: Payer: Self-pay

## 2015-08-23 ENCOUNTER — Telehealth: Payer: Self-pay | Admitting: *Deleted

## 2015-08-23 ENCOUNTER — Ambulatory Visit (INDEPENDENT_AMBULATORY_CARE_PROVIDER_SITE_OTHER): Payer: BLUE CROSS/BLUE SHIELD | Admitting: Sports Medicine

## 2015-08-23 VITALS — BP 115/68 | HR 60 | Wt 133.0 lb

## 2015-08-23 DIAGNOSIS — Z111 Encounter for screening for respiratory tuberculosis: Secondary | ICD-10-CM

## 2015-08-23 NOTE — Progress Notes (Signed)
Patient is here for 2nd varicella and TB skin test. Patient did receive last varicella on 7/31. Advised patient that the vaccine has to be given at least four weeks apart. TB skin test was placed today.

## 2015-08-23 NOTE — Telephone Encounter (Signed)
It really has just not been long enough. Give it some more time.

## 2015-08-23 NOTE — Telephone Encounter (Signed)
Patient states since she has been on the Trintellix she has noticed low libido and difficulty achieving an orgasm. She has been on the Trintellix for 5 days. She wonders if switching to Lexapro 5 mg would still control depression symptoms and not cause sexual dysfunction. Please advise

## 2015-08-24 ENCOUNTER — Ambulatory Visit: Payer: Self-pay

## 2015-08-24 NOTE — Telephone Encounter (Signed)
Left message on vm

## 2015-08-25 ENCOUNTER — Ambulatory Visit (INDEPENDENT_AMBULATORY_CARE_PROVIDER_SITE_OTHER): Payer: BLUE CROSS/BLUE SHIELD | Admitting: Sports Medicine

## 2015-08-25 VITALS — BP 103/48 | HR 63

## 2015-08-25 DIAGNOSIS — Z111 Encounter for screening for respiratory tuberculosis: Secondary | ICD-10-CM | POA: Diagnosis not present

## 2015-08-25 LAB — TB SKIN TEST
Induration: 0 mm
TB Skin Test: NEGATIVE

## 2015-08-25 NOTE — Progress Notes (Signed)
Pt in for ppd read.  Negative results.

## 2015-08-29 DIAGNOSIS — F330 Major depressive disorder, recurrent, mild: Secondary | ICD-10-CM | POA: Diagnosis not present

## 2015-08-30 ENCOUNTER — Telehealth: Payer: Self-pay

## 2015-08-30 NOTE — Telephone Encounter (Signed)
Left message for pt to call office. Received referral from primary care.

## 2015-09-12 DIAGNOSIS — F330 Major depressive disorder, recurrent, mild: Secondary | ICD-10-CM | POA: Diagnosis not present

## 2015-09-14 ENCOUNTER — Ambulatory Visit (INDEPENDENT_AMBULATORY_CARE_PROVIDER_SITE_OTHER): Payer: BLUE CROSS/BLUE SHIELD | Admitting: Obstetrics & Gynecology

## 2015-09-14 ENCOUNTER — Ambulatory Visit (INDEPENDENT_AMBULATORY_CARE_PROVIDER_SITE_OTHER): Payer: BLUE CROSS/BLUE SHIELD | Admitting: Sports Medicine

## 2015-09-14 ENCOUNTER — Encounter: Payer: Self-pay | Admitting: Obstetrics & Gynecology

## 2015-09-14 VITALS — BP 120/76 | HR 65 | Ht 66.0 in | Wt 132.0 lb

## 2015-09-14 VITALS — BP 103/64 | HR 62 | Resp 16 | Wt 133.1 lb

## 2015-09-14 DIAGNOSIS — Z23 Encounter for immunization: Secondary | ICD-10-CM | POA: Diagnosis not present

## 2015-09-14 DIAGNOSIS — F418 Other specified anxiety disorders: Secondary | ICD-10-CM

## 2015-09-14 DIAGNOSIS — Z124 Encounter for screening for malignant neoplasm of cervix: Secondary | ICD-10-CM

## 2015-09-14 DIAGNOSIS — F988 Other specified behavioral and emotional disorders with onset usually occurring in childhood and adolescence: Secondary | ICD-10-CM | POA: Insufficient documentation

## 2015-09-14 DIAGNOSIS — N898 Other specified noninflammatory disorders of vagina: Secondary | ICD-10-CM

## 2015-09-14 DIAGNOSIS — F909 Attention-deficit hyperactivity disorder, unspecified type: Secondary | ICD-10-CM

## 2015-09-14 DIAGNOSIS — Z01419 Encounter for gynecological examination (general) (routine) without abnormal findings: Secondary | ICD-10-CM | POA: Diagnosis not present

## 2015-09-14 MED ORDER — LISDEXAMFETAMINE DIMESYLATE 20 MG PO CAPS: 20.0000 mg | ORAL_CAPSULE | Freq: Every day | ORAL | 0 refills | Status: DC

## 2015-09-14 MED ORDER — VORTIOXETINE HBR 5 MG PO TABS: ORAL_TABLET | ORAL | 3 refills | Status: DC

## 2015-09-14 NOTE — Assessment & Plan Note (Signed)
Well-controlled now, no changes in dosage on Brintellix, no more sexual dysfunction.

## 2015-09-14 NOTE — Progress Notes (Signed)
  Subjective:    CC: Follow-up  HPI: Anxiety and depression: Now well controlled on Brintellix, no further sexual dysfunction. Happy with dose.  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  Past medical history, Surgical history, Family history not pertinant except as noted below, Social history, Allergies, and medications have been entered into the medical record, reviewed, and no changes needed.   Review of Systems: No fevers, chills, night sweats, weight loss, chest pain, or shortness of breath.   Objective:    General: Well Developed, well nourished, and in no acute distress.  Neuro: Alert and oriented x3, extra-ocular muscles intact, sensation grossly intact.  HEENT: Normocephalic, atraumatic, pupils equal round reactive to light, neck supple, no masses, no lymphadenopathy, thyroid nonpalpable.  Skin: Warm and dry, no rashes. Cardiac: Regular rate and rhythm, no murmurs rubs or gallops, no lower extremity edema.  Respiratory: Clear to auscultation bilaterally. Not using accessory muscles, speaking in full sentences.  Impression and Recommendations:    Depression with anxiety Well-controlled now, no changes in dosage on Brintellix, no more sexual dysfunction.  Attention deficit disorder Adding low-dose Vyvanse, she does have a diagnosis from her childhood. Agrees to minimize use of this medication.  I spent 25 minutes with this patient, greater than 50% was face-to-face time counseling regarding the above diagnoses

## 2015-09-14 NOTE — Progress Notes (Signed)
Subjective:     Alejandra George is a 25 y.o. female here for a routine exam.  Current complaints: pt c/o abnormal discharge.  It seems to be white and clumpy.   No itching. No odor. Female partner. Pt is worried about STI's. She has had chlamydia x 2 in the past when she had female partners. Her current female partner has not been tested.   Gynecologic History Patient's last menstrual period was 08/24/2015. Contraception: none Last Pap: WNL 08/21/2013 Adequacy Reason Satisfactory for evaluation, endocervical/transformation zone component PRESENT. Diagnosis NEGATIVE FOR INTRAEPITHELIAL LESIONS OR MALIGNANCY. BENIGN REACTIVE/REPARATIVE CHANGES. Enid Cutter MD Pathologist, Electronic Signature (Case signed 09/03/2013) Source CervicoVaginal Pap [ThinPrep Imaged] Ancillary Testing Chlamydia T. CT: Negative Completed by [id number] on 2013-09-02 Neisseria G. NG: Negative Results were: normal  Obstetric History OB History  Gravida Para Term Preterm AB Living  1 1 1  0 0 1  SAB TAB Ectopic Multiple Live Births  0 0 0 0 1    # Outcome Date GA Lbr Len/2nd Weight Sex Delivery Anes PTL Lv  1 Term 07/24/11 [redacted]w[redacted]d  8 lb 9.9 oz (3.91 kg) M CS-LVertical EPI  LIV     The following portions of the patient's history were reviewed and updated as appropriate: allergies, current medications, past family history, past medical history, past social history, past surgical history and problem list.  Review of Systems Pertinent items are noted in HPI.    Objective:   BP 120/76 (BP Location: Left Arm, Patient Position: Sitting)   Pulse 65   Ht 5\' 6"  (1.676 m)   Wt 132 lb (59.9 kg)   LMP 08/24/2015   BMI 21.31 kg/m  General Appearance:    Alert, cooperative, no distress, appears stated age  Head:    Normocephalic, without obvious abnormality, atraumatic  Eyes:    conjunctiva/corneas clear, EOM's intact, both eyes  Ears:    Normal external ear canals, both ears  Nose:   Nares normal, septum midline, mucosa  normal, no drainage    or sinus tenderness  Throat:   Lips, mucosa, and tongue normal; teeth and gums normal  Neck:   Supple, symmetrical, trachea midline, no adenopathy;    thyroid:  no enlargement/tenderness/nodules  Back:     Symmetric, no curvature, ROM normal, no CVA tenderness  Lungs:     Clear to auscultation bilaterally, respirations unlabored  Chest Wall:    No tenderness or deformity   Heart:    Regular rate and rhythm, S1 and S2 normal, no murmur, rub   or gallop  Breast Exam:    No tenderness, masses, or nipple abnormality  Abdomen:     Soft, non-tender, bowel sounds active all four quadrants,    no masses, no organomegaly  Genitalia:    Normal female without lesion, discharge or tenderness     Extremities:   Extremities normal, atraumatic, no cyanosis or edema  Pulses:   2+ and symmetric all extremities  Skin:   Skin color, texture, turgor normal, no rashes or lesions    Assessment:    Healthy female exam.   Leukorrhea- need to r/o STIs    Plan:    Follow up in: 1 year.    F/u PAP and cx F/u wet mount  Darrol Brandenburg L. Harraway-Smith, M.D., Cherlynn June

## 2015-09-14 NOTE — Assessment & Plan Note (Addendum)
Adding low-dose Vyvanse, she does have a diagnosis from her childhood. Agrees to minimize use of this medication.

## 2015-09-14 NOTE — Addendum Note (Signed)
Addended by: Phill Myron on: 09/14/2015 10:06 AM   Modules accepted: Orders

## 2015-09-14 NOTE — Patient Instructions (Signed)
Sexually Transmitted Disease °A sexually transmitted disease (STD) is a disease or infection that may be passed (transmitted) from person to person, usually during sexual activity. This may happen by way of saliva, semen, blood, vaginal mucus, or urine. Common STDs include: °· Gonorrhea. °· Chlamydia. °· Syphilis. °· HIV and AIDS. °· Genital herpes. °· Hepatitis B and C. °· Trichomonas. °· Human papillomavirus (HPV). °· Pubic lice. °· Scabies. °· Mites. °· Bacterial vaginosis. °WHAT ARE CAUSES OF STDs? °An STD may be caused by bacteria, a virus, or parasites. STDs are often transmitted during sexual activity if one person is infected. However, they may also be transmitted through nonsexual means. STDs may be transmitted after:  °· Sexual intercourse with an infected person. °· Sharing sex toys with an infected person. °· Sharing needles with an infected person or using unclean piercing or tattoo needles. °· Having intimate contact with the genitals, mouth, or rectal areas of an infected person. °· Exposure to infected fluids during birth. °WHAT ARE THE SIGNS AND SYMPTOMS OF STDs? °Different STDs have different symptoms. Some people may not have any symptoms. If symptoms are present, they may include: °· Painful or bloody urination. °· Pain in the pelvis, abdomen, vagina, anus, throat, or eyes. °· A skin rash, itching, or irritation. °· Growths, ulcerations, blisters, or sores in the genital and anal areas. °· Abnormal vaginal discharge with or without bad odor. °· Penile discharge in men. °· Fever. °· Pain or bleeding during sexual intercourse. °· Swollen glands in the groin area. °· Yellow skin and eyes (jaundice). This is seen with hepatitis. °· Swollen testicles. °· Infertility. °· Sores and blisters in the mouth. °HOW ARE STDs DIAGNOSED? °To make a diagnosis, your health care provider may: °· Take a medical history. °· Perform a physical exam. °· Take a sample of any discharge to examine. °· Swab the throat,  cervix, opening to the penis, rectum, or vagina for testing. °· Test a sample of your first morning urine. °· Perform blood tests. °· Perform a Pap test, if this applies. °· Perform a colposcopy. °· Perform a laparoscopy. °HOW ARE STDs TREATED? °Treatment depends on the STD. Some STDs may be treated but not cured. °· Chlamydia, gonorrhea, trichomonas, and syphilis can be cured with antibiotic medicine. °· Genital herpes, hepatitis, and HIV can be treated, but not cured, with prescribed medicines. The medicines lessen symptoms. °· Genital warts from HPV can be treated with medicine or by freezing, burning (electrocautery), or surgery. Warts may come back. °· HPV cannot be cured with medicine or surgery. However, abnormal areas may be removed from the cervix, vagina, or vulva. °· If your diagnosis is confirmed, your recent sexual partners need treatment. This is true even if they are symptom-free or have a negative culture or evaluation. They should not have sex until their health care providers say it is okay. °· Your health care provider may test you for infection again 3 months after treatment. °HOW CAN I REDUCE MY RISK OF GETTING AN STD? °Take these steps to reduce your risk of getting an STD: °· Use latex condoms, dental dams, and water-soluble lubricants during sexual activity. Do not use petroleum jelly or oils. °· Avoid having multiple sex partners. °· Do not have sex with someone who has other sex partners °· Do not have sex with anyone you do not know or who is at high risk for an STD. °· Avoid risky sex practices that can break your skin. °· Do not have sex   if you have open sores on your mouth or skin. °· Avoid drinking too much alcohol or taking illegal drugs. Alcohol and drugs can affect your judgment and put you in a vulnerable position. °· Avoid engaging in oral and anal sex acts. °· Get vaccinated for HPV and hepatitis. If you have not received these vaccines in the past, talk to your health care  provider about whether one or both might be right for you. °· If you are at risk of being infected with HIV, it is recommended that you take a prescription medicine daily to prevent HIV infection. This is called pre-exposure prophylaxis (PrEP). You are considered at risk if: °¨ You are a man who has sex with other men (MSM). °¨ You are a heterosexual man or woman and are sexually active with more than one partner. °¨ You take drugs by injection. °¨ You are sexually active with a partner who has HIV. °· Talk with your health care provider about whether you are at high risk of being infected with HIV. If you choose to begin PrEP, you should first be tested for HIV. You should then be tested every 3 months for as long as you are taking PrEP. °WHAT SHOULD I DO IF I THINK I HAVE AN STD? °· See your health care provider. °· Tell your sexual partner(s). They should be tested and treated for any STDs. °· Do not have sex until your health care provider says it is okay. °WHEN SHOULD I GET IMMEDIATE MEDICAL CARE? °Contact your health care provider right away if:  °· You have severe abdominal pain. °· You are a man and notice swelling or pain in your testicles. °· You are a woman and notice swelling or pain in your vagina. °  °This information is not intended to replace advice given to you by your health care provider. Make sure you discuss any questions you have with your health care provider. °  °Document Released: 03/23/2002 Document Revised: 01/21/2014 Document Reviewed: 07/21/2012 °Elsevier Interactive Patient Education ©2016 [employer] ° °

## 2015-09-15 ENCOUNTER — Telehealth: Payer: Self-pay

## 2015-09-15 ENCOUNTER — Other Ambulatory Visit: Payer: Self-pay | Admitting: Obstetrics & Gynecology

## 2015-09-15 DIAGNOSIS — B9689 Other specified bacterial agents as the cause of diseases classified elsewhere: Secondary | ICD-10-CM

## 2015-09-15 DIAGNOSIS — N760 Acute vaginitis: Principal | ICD-10-CM

## 2015-09-15 LAB — WET PREP BY MOLECULAR PROBE
CANDIDA SPECIES: NEGATIVE
GARDNERELLA VAGINALIS: POSITIVE — AB
TRICHOMONAS VAG: NEGATIVE

## 2015-09-15 LAB — CYTOLOGY - PAP

## 2015-09-15 MED ORDER — METRONIDAZOLE 500 MG PO TABS: 500.0000 mg | ORAL_TABLET | Freq: Two times a day (BID) | ORAL | 0 refills | Status: DC

## 2015-09-15 MED ORDER — FLUCONAZOLE 150 MG PO TABS: 150.0000 mg | ORAL_TABLET | Freq: Once | ORAL | 0 refills | Status: AC

## 2015-09-15 NOTE — Telephone Encounter (Signed)
Left message for patient that I will send result in My chart message. Kathrene Alu RN BSN

## 2015-09-17 ENCOUNTER — Encounter: Payer: Self-pay | Admitting: Obstetrics & Gynecology

## 2015-09-19 ENCOUNTER — Telehealth: Payer: Self-pay | Admitting: Sports Medicine

## 2015-09-19 NOTE — Telephone Encounter (Signed)
Com pelted and faxed PA form for Trintellix. Waiting for medicaid approval.

## 2015-09-20 ENCOUNTER — Telehealth: Payer: Self-pay

## 2015-09-20 DIAGNOSIS — F988 Other specified behavioral and emotional disorders with onset usually occurring in childhood and adolescence: Secondary | ICD-10-CM

## 2015-09-20 MED ORDER — LISDEXAMFETAMINE DIMESYLATE 10 MG PO CAPS: 10.0000 mg | ORAL_CAPSULE | Freq: Every day | ORAL | 0 refills | Status: DC

## 2015-09-20 NOTE — Telephone Encounter (Signed)
Let us drop the dose to 10 mg, prescription is in my box.

## 2015-09-20 NOTE — Telephone Encounter (Signed)
Alejandra George states the Vyvanse did help the first two days but now it is making her feel anxious and tired. The medication is not helping with focus. Please advise.

## 2015-09-20 NOTE — Telephone Encounter (Signed)
Left message advising about the medication.

## 2015-09-26 DIAGNOSIS — F330 Major depressive disorder, recurrent, mild: Secondary | ICD-10-CM | POA: Diagnosis not present

## 2015-10-10 DIAGNOSIS — F330 Major depressive disorder, recurrent, mild: Secondary | ICD-10-CM | POA: Diagnosis not present

## 2015-10-26 DIAGNOSIS — F330 Major depressive disorder, recurrent, mild: Secondary | ICD-10-CM | POA: Diagnosis not present

## 2015-11-07 DIAGNOSIS — F330 Major depressive disorder, recurrent, mild: Secondary | ICD-10-CM | POA: Diagnosis not present

## 2015-11-21 DIAGNOSIS — F330 Major depressive disorder, recurrent, mild: Secondary | ICD-10-CM | POA: Diagnosis not present

## 2015-11-22 ENCOUNTER — Telehealth: Payer: Self-pay | Admitting: *Deleted

## 2015-11-22 NOTE — Telephone Encounter (Signed)
Tried to submit a PA through [place] [employer]. Patient does not have active coverage through [facility]

## 2015-12-01 NOTE — Telephone Encounter (Signed)
I think I had called this patient already to let her know but called back and left message  Asking if she has been able to get medication and if she had another insurance card that needed to be scanned in to let us know

## 2015-12-05 DIAGNOSIS — F330 Major depressive disorder, recurrent, mild: Secondary | ICD-10-CM | POA: Diagnosis not present

## 2015-12-11 NOTE — Telephone Encounter (Signed)
Spoke to patient before thanksgiving break and she states she has not gotten the Trintellix. I did let her know that per last PA attempt her Medicaid was inactive. Patient states she would have to "get that fixed." Patient states the last time she received the Trintellix it was auth through [place] and it was $ 40. This is not the case since I did the PA myself and had to call [place] [employer] and got it authorized through medicaid(See phone note from 08-16-15) . Also Kelsi submitted a PA through [facility] and was pending. I think at that point her Medicaid had expired. I did try to call [employer] and the prompts listed a list of her benefits and pharmacy benefits was not one of them. I tried several times to get through to speak with a representative and was unable to do so. From listening to prompts on [place] [employer] I do not believe this patient has pharmacy benefits through them.

## 2015-12-19 DIAGNOSIS — F330 Major depressive disorder, recurrent, mild: Secondary | ICD-10-CM | POA: Diagnosis not present

## 2016-01-02 DIAGNOSIS — F330 Major depressive disorder, recurrent, mild: Secondary | ICD-10-CM | POA: Diagnosis not present

## 2016-01-05 ENCOUNTER — Ambulatory Visit (INDEPENDENT_AMBULATORY_CARE_PROVIDER_SITE_OTHER): Payer: BLUE CROSS/BLUE SHIELD | Admitting: Sports Medicine

## 2016-01-05 ENCOUNTER — Encounter: Payer: Self-pay | Admitting: Sports Medicine

## 2016-01-05 DIAGNOSIS — L700 Acne vulgaris: Secondary | ICD-10-CM | POA: Diagnosis not present

## 2016-01-05 DIAGNOSIS — N898 Other specified noninflammatory disorders of vagina: Secondary | ICD-10-CM

## 2016-01-05 DIAGNOSIS — L709 Acne, unspecified: Secondary | ICD-10-CM | POA: Insufficient documentation

## 2016-01-05 LAB — WET PREP FOR TRICH, YEAST, CLUE
Clue Cells Wet Prep HPF POC: NONE SEEN
Trich, Wet Prep: NONE SEEN
WBC, Wet Prep HPF POC: NONE SEEN
Yeast Wet Prep HPF POC: NONE SEEN

## 2016-01-05 MED ORDER — NORGESTIM-ETH ESTRAD TRIPHASIC 0.18/0.215/0.25 MG-25 MCG PO TABS: 1.0000 | ORAL_TABLET | Freq: Every day | ORAL | 11 refills | Status: DC

## 2016-01-05 NOTE — Progress Notes (Signed)
  Subjective:    CC: Vaginal discharge  HPI: For the past week this pleasant 25 year old female has noted a change in the consistency of her vaginal discharge, she tells me it becomes somewhat more cloudy, milky, really doesn't have any irritation, no dysuria, symptoms are mild, persistent.  Acne: Worse around her cycles, would like a birth control that would also take care of her acne.  Past medical history:  Negative.  See flowsheet/record as well for more information.  Surgical history: Negative.  See flowsheet/record as well for more information.  Family history: Negative.  See flowsheet/record as well for more information.  Social history: Negative.  See flowsheet/record as well for more information.  Allergies, and medications have been entered into the medical record, reviewed, and no changes needed.   Review of Systems: No fevers, chills, night sweats, weight loss, chest pain, or shortness of breath.   Objective:    General: Well Developed, well nourished, and in no acute distress.  Neuro: Alert and oriented x3, extra-ocular muscles intact, sensation grossly intact.  HEENT: Normocephalic, atraumatic, pupils equal round reactive to light, neck supple, no masses, no lymphadenopathy, thyroid nonpalpable.  Skin: Warm and dry, no rashes. Cardiac: Regular rate and rhythm, no murmurs rubs or gallops, no lower extremity edema.  Respiratory: Clear to auscultation bilaterally. Not using accessory muscles, speaking in full sentences. Pelvic: Performed with a female chaperone, vulva, vault unremarkable with the exception of scant slightly cloudy discharge, cervix unremarkable. Swabs taken.  Impression and Recommendations:    Vaginal discharge History of days change in her discharge, it somewhat more Milky. Pelvic exam performed that was unremarkable, swabs are taken for BV, yeast, trichomoniasis. Treatment to follow results.  Acne Start with low-dose triphasic birth  control. Ultimately we can try BenzaClin, and topical retinoids if needed.  I spent 25 minutes with this patient, greater than 50% was face-to-face time counseling regarding the above diagnoses

## 2016-01-05 NOTE — Assessment & Plan Note (Signed)
History of days change in her discharge, it somewhat more Milky. Pelvic exam performed that was unremarkable, swabs are taken for BV, yeast, trichomoniasis. Treatment to follow results.

## 2016-01-05 NOTE — Assessment & Plan Note (Signed)
Start with low-dose triphasic birth control. Ultimately we can try BenzaClin, and topical retinoids if needed.

## 2016-01-07 ENCOUNTER — Encounter: Payer: Self-pay | Admitting: Sports Medicine

## 2016-01-09 DIAGNOSIS — F330 Major depressive disorder, recurrent, mild: Secondary | ICD-10-CM | POA: Diagnosis not present

## 2016-01-12 DIAGNOSIS — F330 Major depressive disorder, recurrent, mild: Secondary | ICD-10-CM | POA: Diagnosis not present

## 2016-02-09 DIAGNOSIS — F330 Major depressive disorder, recurrent, mild: Secondary | ICD-10-CM | POA: Diagnosis not present

## 2016-03-04 ENCOUNTER — Encounter: Payer: Self-pay | Admitting: Family Medicine

## 2016-03-04 ENCOUNTER — Ambulatory Visit (INDEPENDENT_AMBULATORY_CARE_PROVIDER_SITE_OTHER): Payer: BLUE CROSS/BLUE SHIELD | Admitting: Family Medicine

## 2016-03-04 VITALS — BP 124/63 | HR 69 | Wt 132.0 lb

## 2016-03-04 DIAGNOSIS — J0100 Acute maxillary sinusitis, unspecified: Secondary | ICD-10-CM

## 2016-03-04 DIAGNOSIS — L700 Acne vulgaris: Secondary | ICD-10-CM | POA: Diagnosis not present

## 2016-03-04 MED ORDER — CLINDAMYCIN PHOS-BENZOYL PEROX 1-5 % EX GEL: Freq: Every day | CUTANEOUS | 99 refills | Status: DC

## 2016-03-04 MED ORDER — AZITHROMYCIN 250 MG PO TABS: ORAL_TABLET | ORAL | 0 refills | Status: AC

## 2016-03-04 NOTE — Progress Notes (Signed)
Subjective:    Patient ID: Alejandra George, female    DOB: 1990/10/25, 26 y.o.   MRN: [id number]  HPI  26 year old female comes in today with 48 hours of nasal congestion. She's been using Alka-Seltzer plus. She denies any ear pain or sore throat. Infectious actually had her tonsils removed last year. No nausea vomiting or diarrhea but she does have a lot of postnasal drip and feels like there is a little bit of congestion in her chest but no significant cough. No fever. No worsening or alleviating symptoms. She was exposed to influenza.  She would also like to discuss her acne today. It mostly affects her upper back it really does not affect her face. She was on birth control for several months to see if it would help control it and it did not. She says she mostly gets pustules.   Review of Systems  BP 124/63   Pulse 69   Wt 132 lb (59.9 kg)   BMI 21.31 kg/m     Allergies  Allergen Reactions  . Penicillins Swelling  . Cephalosporins Rash    Past Medical History:  Diagnosis Date  . Anxiety   . Asthma   . Depression   . History of chicken pox    as a child  . History of polydrug abuse   . Vaginal Pap smear, abnormal     Past Surgical History:  Procedure Laterality Date  . CESAREAN SECTION  07/24/2011   Procedure: CESAREAN SECTION;  Surgeon: Sharene Butters, MD;  Location: [facility];  Service: Gynecology;  Laterality: N/A;  . NO PAST SURGERIES      Social History   Social History  . Marital status: Single    Spouse name: N/A  . Number of children: N/A  . Years of education: N/A   Occupational History  . Not on file.   Social History Main Topics  . Smoking status: Never Smoker  . Smokeless tobacco: Never Used  . Alcohol use No  . Drug use: Yes    Types: Amphetamines, Heroin, "Crack" cocaine     Comment: heroine  . Sexual activity: Yes   Other Topics Concern  . Not on file   Social History Narrative  . No narrative on file    Family History  Problem  Relation Age of Onset  . Diabetes Mother   . Hypertension Mother   . Hypertension Maternal Grandmother   . Diabetes Maternal Grandmother   . Cancer Maternal Grandmother     Breast  . Cancer Maternal Grandfather     Lung    Outpatient Encounter Prescriptions as of 03/04/2016  Medication Sig  . albuterol (PROVENTIL HFA;VENTOLIN HFA) 108 (90 BASE) MCG/ACT inhaler Inhale 2 puffs into the lungs every 4 (four) hours as needed for wheezing or shortness of breath.  Marland Kitchen albuterol (PROVENTIL) (2.5 MG/3ML) 0.083% nebulizer solution Take 3 mLs (2.5 mg total) by nebulization every 4 (four) hours as needed for wheezing or shortness of breath (please include nebulizer machine, hoses, and mask if needed.).  Marland Kitchen traMADol (ULTRAM) 50 MG tablet 1-2 tabs by mouth Q8 hours, maximum 6 tabs per day.  . TRINTELLIX 10 MG TABS TK 1 T PO D  . azithromycin (ZITHROMAX) 250 MG tablet 2 Ttabs PO on Day 1, then one a day x 4 days.  . clindamycin-benzoyl peroxide (BENZACLIN) gel Apply topically at bedtime.  . [DISCONTINUED] lisdexamfetamine 10 MG CAPS Take 10 mg by mouth daily. (Patient not taking: Reported on 03/04/2016)  . [  DISCONTINUED] metroNIDAZOLE (FLAGYL) 500 MG tablet Take 1 tablet (500 mg total) by mouth 2 (two) times daily. (Patient not taking: Reported on 01/05/2016)  . [DISCONTINUED] Norgestimate-Ethinyl Estradiol Triphasic (TRI-LO-SPRINTEC) 0.18/0.215/0.25 MG-25 MCG tab Take 1 tablet by mouth daily. (Patient not taking: Reported on 03/04/2016)  . [DISCONTINUED] vortioxetine HBr (TRINTELLIX) 5 MG TABS 1 tab daily PO   No facility-administered encounter medications on file as of 03/04/2016.          Objective:   Physical Exam  Constitutional: She is oriented to person, place, and time. She appears well-developed and well-nourished.  HENT:  Head: Normocephalic and atraumatic.  Right Ear: External ear normal.  Left Ear: External ear normal.  Nose: Nose normal.  Mouth/Throat: Oropharynx is clear and moist.   TMs and canals are clear.   Eyes: Conjunctivae and EOM are normal. Pupils are equal, round, and reactive to light.  Neck: Neck supple. No thyromegaly present.  Cardiovascular: Normal rate, regular rhythm and normal heart sounds.   Pulmonary/Chest: Effort normal and breath sounds normal. She has no wheezes.  Lymphadenopathy:    She has no cervical adenopathy.  Neurological: She is alert and oriented to person, place, and time.  Skin: Skin is warm and dry.  Psychiatric: She has a normal mood and affect.          Assessment & Plan:  Acute sinusitis-explained to her that it's likely viral as she's only had symptoms for 48 hour. Explained that most of the time is caused by virus. If by the end of the week she's not starting to improve her feeling better then can fill the prescription for antibiotic. She has exams this week with school and she is worried she's not going to do well. Encouraged her to use nasal saline and continue her Alka-Seltzer plus as needed. She can call if she feels that she is getting worse. Chest is clear.  Acne vulgaris, upper back-we'll try treating with BenzaClin gel. Apply at bedtime. Warned about potential for bleaching clothing. If it starts to irritate the skin then can use it every other day if needed. Encouraged her to give it a try for at least a couple months before making a decision if it's working or not.

## 2016-03-08 DIAGNOSIS — F330 Major depressive disorder, recurrent, mild: Secondary | ICD-10-CM | POA: Diagnosis not present

## 2016-03-19 DIAGNOSIS — F330 Major depressive disorder, recurrent, mild: Secondary | ICD-10-CM | POA: Diagnosis not present

## 2016-03-26 DIAGNOSIS — F330 Major depressive disorder, recurrent, mild: Secondary | ICD-10-CM | POA: Diagnosis not present

## 2016-04-12 DIAGNOSIS — F330 Major depressive disorder, recurrent, mild: Secondary | ICD-10-CM | POA: Diagnosis not present

## 2016-04-23 DIAGNOSIS — F330 Major depressive disorder, recurrent, mild: Secondary | ICD-10-CM | POA: Diagnosis not present

## 2016-05-07 DIAGNOSIS — F330 Major depressive disorder, recurrent, mild: Secondary | ICD-10-CM | POA: Diagnosis not present

## 2016-05-21 DIAGNOSIS — F330 Major depressive disorder, recurrent, mild: Secondary | ICD-10-CM | POA: Diagnosis not present

## 2016-06-04 DIAGNOSIS — F330 Major depressive disorder, recurrent, mild: Secondary | ICD-10-CM | POA: Diagnosis not present

## 2016-06-11 ENCOUNTER — Encounter: Payer: Self-pay | Admitting: Sports Medicine

## 2016-06-11 ENCOUNTER — Ambulatory Visit (INDEPENDENT_AMBULATORY_CARE_PROVIDER_SITE_OTHER): Payer: BLUE CROSS/BLUE SHIELD | Admitting: Sports Medicine

## 2016-06-11 DIAGNOSIS — Z0000 Encounter for general adult medical examination without abnormal findings: Secondary | ICD-10-CM

## 2016-06-11 DIAGNOSIS — F988 Other specified behavioral and emotional disorders with onset usually occurring in childhood and adolescence: Secondary | ICD-10-CM | POA: Diagnosis not present

## 2016-06-11 NOTE — Assessment & Plan Note (Signed)
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

## 2016-06-11 NOTE — Assessment & Plan Note (Signed)
Patient declines to use anymore Vyvanse, would like to avoid controlled substances and I think this is reasonable. Should she desire further human for attention deficit disorder because certainly use Strattera or Intuniv. Return as needed for this.

## 2016-06-11 NOTE — Progress Notes (Signed)
  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 She needs some paperwork filled out.  Attention deficit disorder: Try the Vyvanse but really didn't like it, ultimately she does not desire to be on anymore medication for this.  Past medical history:  Negative.  See flowsheet/record as well for more information.  Surgical history: Negative.  See flowsheet/record as well for more information.  Family history: Negative.  See flowsheet/record as well for more information.  Social history: Negative.  See flowsheet/record as well for more information.  Allergies, and medications have been entered into the medical record, reviewed, and no changes needed.   Review of Systems: No fevers, chills, night sweats, weight loss, chest pain, or shortness of breath.   Objective:    General: Well Developed, well nourished, and in no acute distress.  Neuro: Alert and oriented x3, extra-ocular muscles intact, sensation grossly intact.  HEENT: Normocephalic, atraumatic, pupils equal round reactive to light, neck supple, no masses, no lymphadenopathy, thyroid nonpalpable.  Skin: Warm and dry, no rashes. Cardiac: Regular rate and rhythm, no murmurs rubs or gallops, no lower extremity edema.  Respiratory: Clear to auscultation bilaterally. Not using accessory muscles, speaking in full sentences.  Impression and Recommendations:    Attention deficit disorder Patient declines to use anymore Vyvanse, would like to avoid controlled substances and I think this is reasonable. Should she desire further human for attention deficit disorder because certainly use Strattera or Intuniv. Return as needed for this.  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  I spent 25 minutes with this patient, greater than 50% was face-to-face time counseling regarding  the above diagnoses

## 2016-06-18 DIAGNOSIS — F330 Major depressive disorder, recurrent, mild: Secondary | ICD-10-CM | POA: Diagnosis not present

## 2016-07-02 DIAGNOSIS — F330 Major depressive disorder, recurrent, mild: Secondary | ICD-10-CM | POA: Diagnosis not present

## 2016-07-08 ENCOUNTER — Ambulatory Visit (INDEPENDENT_AMBULATORY_CARE_PROVIDER_SITE_OTHER): Payer: BLUE CROSS/BLUE SHIELD | Admitting: Sports Medicine

## 2016-07-08 DIAGNOSIS — F418 Other specified anxiety disorders: Secondary | ICD-10-CM | POA: Diagnosis not present

## 2016-07-08 MED ORDER — VILAZODONE HCL 10 MG PO TABS: 10.0000 mg | ORAL_TABLET | Freq: Every day | ORAL | 3 refills | Status: DC

## 2016-07-08 NOTE — Assessment & Plan Note (Signed)
Has failed Lexapro, Zoloft, Celexa for the past. Trintellix even at the lowest dose caused excessive flat affect and ambivalence. We are going to try Viibryd at the lowest dose.  Discount coupon given, return in one month.

## 2016-07-08 NOTE — Progress Notes (Signed)
  Subjective:    CC: Follow-up  HPI: Alejandra George returns, she is a pleasant 26 year old female with a mood disorder, we tried multiple standard SSRIs in the past according Celexa, Lexapro, Zoloft that all had intolerable sexual adverse effects.  More recently we tried Trintellix at the lowest dose, she had a fantastic improvement in her anxiety and depression and no sexual side effects however felt somewhat ambivalent about occurrences that she felt should bring on some emotional reaction. She even tells me she watched multiple sad and romantic movies in a row in the hopes of crying but was not able to. She seeks to try something else.  Past medical history:  Negative.  See flowsheet/record as well for more information.  Surgical history: Negative.  See flowsheet/record as well for more information.  Family history: Negative.  See flowsheet/record as well for more information.  Social history: Negative.  See flowsheet/record as well for more information.  Allergies, and medications have been entered into the medical record, reviewed, and no changes needed.   Review of Systems: No fevers, chills, night sweats, weight loss, chest pain, or shortness of breath.   Objective:    General: Well Developed, well nourished, and in no acute distress.  Neuro: Alert and oriented x3, extra-ocular muscles intact, sensation grossly intact.  HEENT: Normocephalic, atraumatic, pupils equal round reactive to light, neck supple, no masses, no lymphadenopathy, thyroid nonpalpable.  Skin: Warm and dry, no rashes. Cardiac: Regular rate and rhythm, no murmurs rubs or gallops, no lower extremity edema.  Respiratory: Clear to auscultation bilaterally. Not using accessory muscles, speaking in full sentences.  Impression and Recommendations:    Depression with anxiety Has failed Lexapro, Zoloft, Celexa for the past. Trintellix even at the lowest dose caused excessive flat affect and ambivalence. We are going to try  Viibryd at the lowest dose.  Discount coupon given, return in one month.  I spent 25 minutes with this patient, greater than 50% was face-to-face time counseling regarding the above diagnoses

## 2016-07-10 ENCOUNTER — Telehealth: Payer: Self-pay | Admitting: *Deleted

## 2016-07-10 DIAGNOSIS — F330 Major depressive disorder, recurrent, mild: Secondary | ICD-10-CM | POA: Diagnosis not present

## 2016-07-10 NOTE — Telephone Encounter (Signed)
Pt called and wanted to pass along some information about some medication that she had discussed with her therapist, she wanted to speak to Dr. Mcneil Sober nurse about this. As I was trying to gather this information from her the phone disconnected.   Her therapist is recommending that she goes a month w/o any antidepressant she did get the medication filled.(Viibryd) she also recommended that she takes something for seasonal affective disorder in the winter months.  she feels that she should take an ADD med like vyvanse or adderall for when school starts again.  Also, if Dr. Darene Lamer has any questions he can contact her therapist:  Luiz Blare [phone]

## 2016-07-11 NOTE — Telephone Encounter (Signed)
Noted, her difficulty concentrating is likely complicated by depression, not necessarily attention deficit disorder. Non-stimulant ADD medications such as Strattera have a similar mechanism to antidepressants, serotonin and norepinephrine reuptake inhibition. I'm happy to not treat her through the summertime however I think that [place] which has both serotonergic and noradrenergic efficacy could treat both her difficulties with attention and mood. Also considering her history of illicit drug use resulting in hepatitis C, I think an amphetamine-based controlled substance would be a bad idea.  If finding a good antidepressant improves not only her mood, anxiety, and attention without having to use an amphetamine-based stimulant I think this is a win for everyone.

## 2016-07-12 NOTE — Telephone Encounter (Signed)
Vyvanse is an amphetamine-based ADD medication, I did discuss in my note before that we should avoid amphetamine-based controlled substances. If she would like treatment for attention deficit disorder we can use something non-stimulant like Strattera. That being said, Viibryd has a similar mechanism.

## 2016-07-12 NOTE — Telephone Encounter (Signed)
Pt notified. She reports that she do have some old Vyvanse 10 mg and 20 mg.  She wants to know is it a good idea for her to start back on the lower dose of Vyvanse in August or should she be Rx something new. Please advise. -EH/RMA

## 2016-07-16 NOTE — Telephone Encounter (Signed)
Left vm for pt to return call to clinic -EH/RMA  

## 2016-07-24 ENCOUNTER — Telehealth: Payer: Self-pay | Admitting: Sports Medicine

## 2016-07-24 DIAGNOSIS — F330 Major depressive disorder, recurrent, mild: Secondary | ICD-10-CM | POA: Diagnosis not present

## 2016-07-24 NOTE — Telephone Encounter (Signed)
Dr T: I spoke with pt today and she wants to try the Strattera. Thank you.

## 2016-07-25 MED ORDER — ATOMOXETINE HCL 10 MG PO CAPS: 10.0000 mg | ORAL_CAPSULE | Freq: Every day | ORAL | 3 refills | Status: DC

## 2016-07-25 NOTE — Telephone Encounter (Signed)
Pt called and informed about new Strattera. States she did not try the Viibryd.

## 2016-07-25 NOTE — Telephone Encounter (Signed)
We will start low dose strattera, has she started/noted any improvement yet in anxiety with viibryd?

## 2016-07-31 DIAGNOSIS — F330 Major depressive disorder, recurrent, mild: Secondary | ICD-10-CM | POA: Diagnosis not present

## 2016-08-06 DIAGNOSIS — F330 Major depressive disorder, recurrent, mild: Secondary | ICD-10-CM | POA: Diagnosis not present

## 2016-08-13 DIAGNOSIS — F330 Major depressive disorder, recurrent, mild: Secondary | ICD-10-CM | POA: Diagnosis not present

## 2016-08-27 DIAGNOSIS — F330 Major depressive disorder, recurrent, mild: Secondary | ICD-10-CM | POA: Diagnosis not present

## 2016-09-10 DIAGNOSIS — F330 Major depressive disorder, recurrent, mild: Secondary | ICD-10-CM | POA: Diagnosis not present

## 2016-09-24 DIAGNOSIS — F330 Major depressive disorder, recurrent, mild: Secondary | ICD-10-CM | POA: Diagnosis not present

## 2016-10-04 DIAGNOSIS — F330 Major depressive disorder, recurrent, mild: Secondary | ICD-10-CM | POA: Diagnosis not present

## 2016-10-08 DIAGNOSIS — F330 Major depressive disorder, recurrent, mild: Secondary | ICD-10-CM | POA: Diagnosis not present

## 2016-10-11 DIAGNOSIS — F330 Major depressive disorder, recurrent, mild: Secondary | ICD-10-CM | POA: Diagnosis not present

## 2016-11-26 ENCOUNTER — Encounter: Payer: Self-pay | Admitting: Sports Medicine

## 2016-11-26 ENCOUNTER — Ambulatory Visit (INDEPENDENT_AMBULATORY_CARE_PROVIDER_SITE_OTHER): Payer: Medicaid Other | Admitting: Sports Medicine

## 2016-11-26 DIAGNOSIS — N898 Other specified noninflammatory disorders of vagina: Secondary | ICD-10-CM | POA: Diagnosis not present

## 2016-11-26 DIAGNOSIS — L700 Acne vulgaris: Secondary | ICD-10-CM | POA: Diagnosis not present

## 2016-11-26 DIAGNOSIS — F418 Other specified anxiety disorders: Secondary | ICD-10-CM

## 2016-11-26 LAB — WET PREP FOR TRICH, YEAST, CLUE
MICRO NUMBER:: 81277629
Specimen Quality: ADEQUATE

## 2016-11-26 MED ORDER — DOXYCYCLINE HYCLATE 100 MG PO TABS: 100.0000 mg | ORAL_TABLET | Freq: Every day | ORAL | 1 refills | Status: DC

## 2016-11-26 MED ORDER — BUSPIRONE HCL 15 MG PO TABS: 15.0000 mg | ORAL_TABLET | Freq: Two times a day (BID) | ORAL | 3 refills | Status: DC

## 2016-11-26 NOTE — Assessment & Plan Note (Signed)
Switch to Strattera from Trintellix, added Buspar, high anxiety, increasing BuSpar to 15 twice a day. We can also consider increasing her Strattera as this functions as an SNRI.

## 2016-11-26 NOTE — Assessment & Plan Note (Signed)
No longer having sex with men, does have vaginal discharge, had a break with her girlfriend. She will self wet prep, also testing the traditional STDs.

## 2016-11-26 NOTE — Assessment & Plan Note (Signed)
Diffusely present over the face, back, adding doxycycline, she will take pictures of her acne and we can compare it in 2 months.

## 2016-11-26 NOTE — Progress Notes (Signed)
  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  Recent vaginal discharge, whitish, milky, slightly irritating.  Desires STD check.    Anxiety: Overall doing well, on Strattera, increasing anxiety without suicidal or homicidal ideation, would like to go up on BuSpar dose, does not have a psychiatry appointment for some time now.  Acne: Present on face, back, would like something to treat it.  Past medical history:  Negative.  See flowsheet/record as well for more information.  Surgical history: Negative.  See flowsheet/record as well for more information.  Family history: Negative.  See flowsheet/record as well for more information.  Social history: Negative.  See flowsheet/record as well for more information.  Allergies, and medications have been entered into the medical record, reviewed, and no changes needed.   Review of Systems: No fevers, chills, night sweats, weight loss, chest pain, or shortness of breath.   Objective:    General: Well Developed, well nourished, and in no acute distress.  Neuro: Alert and oriented x3, extra-ocular muscles intact, sensation grossly intact.  HEENT: Normocephalic, atraumatic, pupils equal round reactive to light, neck supple, no masses, no lymphadenopathy, thyroid nonpalpable.  Skin: Warm and dry, no rashes.  Acne vulgaris on face and back with reddish inflammatory comedones. Cardiac: Regular rate and rhythm, no murmurs rubs or gallops, no lower extremity edema.  Respiratory: Clear to auscultation bilaterally. Not using accessory muscles, speaking in full sentences.  Patient performed a self wet prep.  Impression and Recommendations:    Depression with anxiety Switch to Strattera from Trintellix, added Buspar, high anxiety, increasing BuSpar to 15 twice a  day. We can also consider increasing her Strattera as this functions as an SNRI.  Vaginal discharge No longer having sex with men, does have vaginal discharge, had a break with her girlfriend. She will self wet prep, also testing the traditional STDs.   Acne Diffusely present over the face, back, adding doxycycline, she will take pictures of her acne and we can compare it in 2 months.  I spent 25 minutes with this patient, greater than 50% was face-to-face time counseling regarding the above diagnoses ___________________________________________ Gwen Her. Dianah Field, M.D., ABFM., CAQSM. Primary Care and [place] Instructor of [place] of [facility]

## 2016-11-27 ENCOUNTER — Telehealth: Payer: Self-pay

## 2016-11-27 NOTE — Telephone Encounter (Signed)
Error

## 2016-11-28 LAB — C. TRACHOMATIS/N. GONORRHOEAE RNA
C. trachomatis RNA, TMA: NOT DETECTED
N. gonorrhoeae RNA, TMA: NOT DETECTED

## 2016-11-30 LAB — HSV 1/2 AB (IGM), IFA W/RFLX TITER
HSV 1 IgM Screen: NEGATIVE
HSV 2 IgM Screen: NEGATIVE

## 2016-11-30 LAB — RPR: RPR Ser Ql: NONREACTIVE

## 2016-11-30 LAB — HEPATITIS PANEL, ACUTE
Hep A IgM: NONREACTIVE
Hep B C IgM: NONREACTIVE
Hepatitis B Surface Ag: NONREACTIVE
Hepatitis C Ab: NONREACTIVE
SIGNAL TO CUT-OFF: 0.74 (ref <1.00)

## 2016-11-30 LAB — HIV ANTIBODY (ROUTINE TESTING W REFLEX): HIV 1&2 Ab, 4th Generation: NONREACTIVE

## 2017-01-04 ENCOUNTER — Other Ambulatory Visit: Payer: Self-pay | Admitting: Sports Medicine

## 2017-01-15 ENCOUNTER — Telehealth: Payer: Self-pay

## 2017-01-15 NOTE — Telephone Encounter (Signed)
Prior Authorization for [employer]. PA # [phone].   Approved - today until 01/10/2018.  PA # [id number]  Call Ref # [id number]

## 2017-01-27 ENCOUNTER — Ambulatory Visit: Payer: Self-pay | Admitting: Sports Medicine

## 2017-03-18 ENCOUNTER — Ambulatory Visit: Payer: Medicaid Other | Admitting: Sports Medicine

## 2017-03-18 ENCOUNTER — Encounter: Payer: Self-pay | Admitting: Sports Medicine

## 2017-03-18 DIAGNOSIS — L700 Acne vulgaris: Secondary | ICD-10-CM

## 2017-03-18 MED ORDER — NORGESTIMATE-ETH ESTRADIOL 0.25-35 MG-MCG PO TABS: 1.0000 | ORAL_TABLET | Freq: Every day | ORAL | 11 refills | Status: DC

## 2017-03-18 MED ORDER — VORTIOXETINE HBR 10 MG PO TABS: 10.0000 mg | ORAL_TABLET | Freq: Every day | ORAL | 11 refills | Status: DC

## 2017-03-18 MED ORDER — CLINDAMYCIN PHOS-BENZOYL PEROX 1.2-5 % EX GEL: 1.0000 "application " | Freq: Two times a day (BID) | CUTANEOUS | 11 refills | Status: DC

## 2017-03-18 NOTE — Assessment & Plan Note (Signed)
Pictures were taken of her face and back today. Did not note any improvement with doxycycline. Adding COCs and topical BenzaClin. We can add Differin gel if still no improvement.

## 2017-03-18 NOTE — Progress Notes (Signed)
  Subjective:    CC: Follow-up  HPI: Acne: Did not get much better with doxycycline, worsens around her cycles.  Anxiety and depression: We did get approval for Trintellix, she does need a refill.  I reviewed the past medical history, family history, social history, surgical history, and allergies today and no changes were needed.  Please see the problem list section below in epic for further details.  Past Medical History: Past Medical History:  Diagnosis Date  . Anxiety   . Asthma   . Depression   . History of chicken pox    as a child  . History of polydrug abuse   . Vaginal Pap smear, abnormal    Past Surgical History: Past Surgical History:  Procedure Laterality Date  . CESAREAN SECTION  07/24/2011   Procedure: CESAREAN SECTION;  Surgeon: Sharene Butters, MD;  Location: [facility];  Service: Gynecology;  Laterality: N/A;  . NO PAST SURGERIES     Social History: Social History   Socioeconomic History  . Marital status: Single    Spouse name: None  . Number of children: None  . Years of education: None  . Highest education level: None  Social Needs  . Financial resource strain: None  . Food insecurity - worry: None  . Food insecurity - inability: None  . Transportation needs - medical: None  . Transportation needs - non-medical: None  Occupational History  . None  Tobacco Use  . Smoking status: Never Smoker  . Smokeless tobacco: Never Used  Substance and Sexual Activity  . Alcohol use: No  . Drug use: Yes    Types: Amphetamines, Heroin, "Crack" cocaine    Comment: heroine  . Sexual activity: Yes  Other Topics Concern  . None  Social History Narrative  . None   Family History: Family History  Problem Relation Age of Onset  . Diabetes Mother   . Hypertension Mother   . Hypertension Maternal Grandmother   . Diabetes Maternal Grandmother   . Cancer Maternal Grandmother        Breast  . Cancer Maternal Grandfather        Lung   Allergies: Allergies    Allergen Reactions  . Penicillins Swelling  . Cephalosporins Rash   Medications: See med rec.  Review of Systems: No fevers, chills, night sweats, weight loss, chest pain, or shortness of breath.   Objective:    General: Well Developed, well nourished, and in no acute distress.  Neuro: Alert and oriented x3, extra-ocular muscles intact, sensation grossly intact.  HEENT: Normocephalic, atraumatic, pupils equal round reactive to light, neck supple, no masses, no lymphadenopathy, thyroid nonpalpable.  Skin: Warm and dry, no rashes. Cardiac: Regular rate and rhythm, no murmurs rubs or gallops, no lower extremity edema.  Respiratory: Clear to auscultation bilaterally. Not using accessory muscles, speaking in full sentences.  Impression and Recommendations:    Acne Pictures were taken of her face and back today. Did not note any improvement with doxycycline. Adding COCs and topical BenzaClin. We can add Differin gel if still no improvement.  I spent 25 minutes with this patient, greater than 50% was face-to-face time counseling regarding the above diagnoses ___________________________________________ Gwen Her. Dianah Field, M.D., ABFM., CAQSM. Primary Care and [place] Instructor of [place] of [facility]

## 2017-04-08 ENCOUNTER — Ambulatory Visit: Payer: Medicaid Other | Admitting: Sports Medicine

## 2017-04-08 ENCOUNTER — Encounter: Payer: Self-pay | Admitting: Sports Medicine

## 2017-04-08 DIAGNOSIS — S134XXA Sprain of ligaments of cervical spine, initial encounter: Secondary | ICD-10-CM | POA: Diagnosis not present

## 2017-04-08 MED ORDER — ALL-BODY MASSAGE MISC: 1.0000 | 0 refills | Status: DC

## 2017-04-08 MED ORDER — CYCLOBENZAPRINE HCL 10 MG PO TABS: ORAL_TABLET | ORAL | 0 refills | Status: DC

## 2017-04-08 MED ORDER — PREDNISONE 50 MG PO TABS: ORAL_TABLET | ORAL | 0 refills | Status: DC
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
## 2017-04-11 ENCOUNTER — Ambulatory Visit (INDEPENDENT_AMBULATORY_CARE_PROVIDER_SITE_OTHER): Payer: Medicaid Other

## 2017-04-11 DIAGNOSIS — M62838 Other muscle spasm: Secondary | ICD-10-CM

## 2017-04-24 ENCOUNTER — Ambulatory Visit: Payer: Medicaid Other | Admitting: Osteopathic Medicine

## 2017-04-24 ENCOUNTER — Encounter: Payer: Self-pay | Admitting: Osteopathic Medicine

## 2017-04-24 VITALS — BP 119/77 | HR 72 | Temp 98.0°F | Wt 129.0 lb

## 2017-04-24 DIAGNOSIS — M9902 Segmental and somatic dysfunction of thoracic region: Secondary | ICD-10-CM

## 2017-04-24 DIAGNOSIS — S134XXD Sprain of ligaments of cervical spine, subsequent encounter: Secondary | ICD-10-CM

## 2017-04-24 DIAGNOSIS — M9901 Segmental and somatic dysfunction of cervical region: Secondary | ICD-10-CM

## 2017-04-24 DIAGNOSIS — M9900 Segmental and somatic dysfunction of head region: Secondary | ICD-10-CM
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
## 2017-04-25 ENCOUNTER — Encounter: Payer: Self-pay | Admitting: Osteopathic Medicine

## 2017-05-07 ENCOUNTER — Telehealth: Payer: Self-pay

## 2017-05-07 NOTE — Telephone Encounter (Signed)
Yes that's fine, would she like to try anything else non-hormonal for acne?

## 2017-05-07 NOTE — Telephone Encounter (Signed)
Alejandra George called and reports she wants to stop taking the Ortho-Cyclen. She is having mood changes, such as depression and not wanting to do anything. I advised it is ok to stop taking the medication and I would update Dr Jule Economy. She was taking the medication for Acne.

## 2017-05-08 NOTE — Telephone Encounter (Signed)
Left message for a return call if she wanted to try a different type of medication.

## 2017-05-19 ENCOUNTER — Ambulatory Visit (INDEPENDENT_AMBULATORY_CARE_PROVIDER_SITE_OTHER): Payer: Medicaid Other | Admitting: Sports Medicine

## 2017-05-19 ENCOUNTER — Encounter: Payer: Self-pay | Admitting: Sports Medicine

## 2017-05-19 DIAGNOSIS — F418 Other specified anxiety disorders: Secondary | ICD-10-CM | POA: Diagnosis not present

## 2017-05-19 DIAGNOSIS — L700 Acne vulgaris: Secondary | ICD-10-CM

## 2017-05-19 NOTE — Progress Notes (Signed)
Subjective:    CC: Anxiety  HPI: Francine returns, she has severe anxiety that is under moderate control.  She is here with a form, she would like it filled out so that she does not need to have upstairs neighbors or can live in an upstairs floor, she is constantly awakened by noises that they may, this results in severe anxiety.  We started, additional contraceptives for her acne, this seemed to work but she developed some moodiness.  She has since stopped the COC's.  I reviewed the past medical history, family history, social history, surgical history, and allergies today and no changes were needed.  Please see the problem list section below in epic for further details.  Past Medical History: Past Medical History:  Diagnosis Date  . Anxiety   . Asthma   . Depression   . History of chicken pox    as a child  . History of polydrug abuse   . Vaginal Pap smear, abnormal    Past Surgical History: Past Surgical History:  Procedure Laterality Date  . CESAREAN SECTION  07/24/2011   Procedure: CESAREAN SECTION;  Surgeon: Sharene Butters, MD;  Location: [facility];  Service: Gynecology;  Laterality: N/A;  . NO PAST SURGERIES     Social History: Social History   Socioeconomic History  . Marital status: Single    Spouse name: Not on file  . Number of children: Not on file  . Years of education: Not on file  . Highest education level: Not on file  Occupational History  . Not on file  Social Needs  . Financial resource strain: Not on file  . Food insecurity:    Worry: Not on file    Inability: Not on file  . Transportation needs:    Medical: Not on file    Non-medical: Not on file  Tobacco Use  . Smoking status: Never Smoker  . Smokeless tobacco: Never Used  Substance and Sexual Activity  . Alcohol use: No  . Drug use: Yes    Types: Amphetamines, Heroin, "Crack" cocaine    Comment: heroine  . Sexual activity: Yes  Lifestyle  . Physical activity:    Days per week: Not on  file    Minutes per session: Not on file  . Stress: Not on file  Relationships  . Social connections:    Talks on phone: Not on file    Gets together: Not on file    Attends religious service: Not on file    Active member of club or organization: Not on file    Attends meetings of clubs or organizations: Not on file    Relationship status: Not on file  Other Topics Concern  . Not on file  Social History Narrative  . Not on file   Family History: Family History  Problem Relation Age of Onset  . Diabetes Mother   . Hypertension Mother   . Hypertension Maternal Grandmother   . Diabetes Maternal Grandmother   . Cancer Maternal Grandmother        Breast  . Cancer Maternal Grandfather        Lung   Allergies: Allergies  Allergen Reactions  . Penicillins Swelling  . Cephalosporins Rash   Medications: See med rec.  Review of Systems: No fevers, chills, night sweats, weight loss, chest pain, or shortness of breath.   Objective:    General: Well Developed, well nourished, and in no acute distress.  Neuro: Alert and oriented x3, extra-ocular muscles  intact, sensation grossly intact.  HEENT: Normocephalic, atraumatic, pupils equal round reactive to light, neck supple, no masses, no lymphadenopathy, thyroid nonpalpable.  Skin: Warm and dry, no rashes. Cardiac: Regular rate and rhythm, no murmurs rubs or gallops, no lower extremity edema.  Respiratory: Clear to auscultation bilaterally. Not using accessory muscles, speaking in full sentences.  Impression and Recommendations:    Depression with anxiety Doing well on Trintellix and BuSpar. We may also increase her Strattera in the future as this does function as an SNRI. I did fill out a form regarding her housing situation.  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 He can also add Differin gel if still no improvement. ___________________________________________ Gwen Her. Dianah Field, M.D., ABFM., CAQSM. Primary Care and [place] Instructor of [place] of [facility] of Medicine

## 2017-05-19 NOTE — Assessment & Plan Note (Signed)
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 He can also add Differin gel if still no improvement.

## 2017-05-19 NOTE — Assessment & Plan Note (Signed)
Doing well on Trintellix and BuSpar. We may also increase her Strattera in the future as this does function as an SNRI. I did fill out a form regarding her housing situation.

## 2017-06-11 ENCOUNTER — Encounter: Payer: Self-pay | Admitting: Internal Medicine

## 2017-06-12 ENCOUNTER — Encounter: Payer: Self-pay | Admitting: Sports Medicine

## 2017-06-12 ENCOUNTER — Ambulatory Visit (INDEPENDENT_AMBULATORY_CARE_PROVIDER_SITE_OTHER): Payer: Medicaid Other | Admitting: Sports Medicine

## 2017-06-12 DIAGNOSIS — K5641 Fecal impaction: Secondary | ICD-10-CM

## 2017-06-12 LAB — HEMOCCULT GUIAC POC 1CARD (OFFICE): Fecal Occult Blood, POC: NEGATIVE

## 2017-06-12 MED ORDER — POLYETHYLENE GLYCOL 3350 17 G PO PACK: 17.0000 g | PACK | Freq: Two times a day (BID) | ORAL | 11 refills | Status: DC

## 2017-06-12 MED ORDER — BISACODYL 10 MG RE SUPP: 10.0000 mg | Freq: Two times a day (BID) | RECTAL | 0 refills | Status: DC

## 2017-06-12 NOTE — Progress Notes (Signed)
Subjective:    CC: Rectal bleeding, rectal fullness  HPI: This is a pleasant 27 year old female, for the past several months she said increasing fullness in her rectum, a sense of incomplete evacuation, and occasional rectal bleeding, bright red, no pain.  Symptoms are moderate, persistent.  Localized without radiation.  I reviewed the past medical history, family history, social history, surgical history, and allergies today and no changes were needed.  Please see the problem list section below in epic for further details.  Past Medical History: Past Medical History:  Diagnosis Date  . Anxiety   . Asthma   . Depression   . History of chicken pox    as a child  . History of polydrug abuse   . Vaginal Pap smear, abnormal    Past Surgical History: Past Surgical History:  Procedure Laterality Date  . CESAREAN SECTION  07/24/2011   Procedure: CESAREAN SECTION;  Surgeon: Sharene Butters, MD;  Location: [facility];  Service: Gynecology;  Laterality: N/A;  . NO PAST SURGERIES     Social History: Social History   Socioeconomic History  . Marital status: Single    Spouse name: Not on file  . Number of children: Not on file  . Years of education: Not on file  . Highest education level: Not on file  Occupational History  . Not on file  Social Needs  . Financial resource strain: Not on file  . Food insecurity:    Worry: Not on file    Inability: Not on file  . Transportation needs:    Medical: Not on file    Non-medical: Not on file  Tobacco Use  . Smoking status: Never Smoker  . Smokeless tobacco: Never Used  Substance and Sexual Activity  . Alcohol use: No  . Drug use: Yes    Types: Amphetamines, Heroin, "Crack" cocaine    Comment: heroine  . Sexual activity: Yes  Lifestyle  . Physical activity:    Days per week: Not on file    Minutes per session: Not on file  . Stress: Not on file  Relationships  . Social connections:    Talks on phone: Not on file    Gets  together: Not on file    Attends religious service: Not on file    Active member of club or organization: Not on file    Attends meetings of clubs or organizations: Not on file    Relationship status: Not on file  Other Topics Concern  . Not on file  Social History Narrative  . Not on file   Family History: Family History  Problem Relation Age of Onset  . Diabetes Mother   . Hypertension Mother   . Hypertension Maternal Grandmother   . Diabetes Maternal Grandmother   . Cancer Maternal Grandmother        Breast  . Cancer Maternal Grandfather        Lung   Allergies: Allergies  Allergen Reactions  . Penicillins Swelling  . Cephalosporins Rash   Medications: See med rec.  Review of Systems: No fevers, chills, night sweats, weight loss, chest pain, or shortness of breath.   Objective:    General: Well Developed, well nourished, and in no acute distress.  Neuro: Alert and oriented x3, extra-ocular muscles intact, sensation grossly intact.  HEENT: Normocephalic, atraumatic, pupils equal round reactive to light, neck supple, no masses, no lymphadenopathy, thyroid nonpalpable.  Skin: Warm and dry, no rashes. Cardiac: Regular rate and rhythm, no murmurs  rubs or gallops, no lower extremity edema.  Respiratory: Clear to auscultation bilaterally. Not using accessory muscles, speaking in full sentences.  Procedure: Endoscopy Risks, benefits, alternatives explained, with the patient in fetal position we inspected the anal region, no external hemorrhoids visible, using lubricant I advanced the endoscope into the rectum, when the stylette was removed I was able to see a large impacted piece of stool, as the endoscope was withdrawn we did not note any internal hemorrhoids in the visible field. Procedure was conducted with 2 female chaperones. Diagnosis: Fecal impaction.  Impression and Recommendations:    Fecal impaction in rectum (HCC) No internal or external hemorrhoids but I did  see an impacted piece of stool above the dentate line. This is the likely cause of her hematochezia. Adding Dulcolax suppositories. Adding MiraLAX to be used twice a day until stooling regularly, if she does not have a large and comfortable bowel movement in 5 or so days we will set her up for an enema. If all of the above fails we will do a disimpaction. ___________________________________________ Gwen Her. Dianah Field, M.D., ABFM., CAQSM. Primary Care and [place] Instructor of [place] of [facility] Medicine

## 2017-06-12 NOTE — Assessment & Plan Note (Addendum)
No internal or external hemorrhoids but I did see an impacted piece of stool above the dentate line. This is the likely cause of her hematochezia. Adding Dulcolax suppositories. Adding MiraLAX to be used twice a day until stooling regularly, if she does not have a large and comfortable bowel movement in 5 or so days we will set her up for an enema. If all of the above fails we will do a disimpaction.

## 2017-06-12 NOTE — Patient Instructions (Signed)
Fecal Impaction °A fecal impaction is a large, firm amount of stool (feces) that will not pass out of the body. A fecal impaction usually occurs in the end of the large intestine (rectum). It can block the large intestine and cause significant problems. °What are the causes? °This condition may be caused by anything that slows down bowel movements, including: °· Long-term use of medicines that help you have a bowel movement (laxatives). °· Constipation. °· Pain in the rectum. Fecal impaction can occur if you avoid having bowel movements due to the pain. Pain in the rectum can result from a medical condition, such as hemorrhoids or anal fissures. °· Narcotic pain-relieving medicines, such as methadone, morphine, or codeine. °· Not drinking enough fluids. °· Being inactive for a long period of time. °· Diseases of the brain or nervous system that damage nerves that control the muscles of the intestines. ° °What are the signs or symptoms? °Symptoms of this condition include: °· Breathing problems. °· Nausea, vomiting, and dehydration. °· Dizziness. °· Confusion. °· Rapid heartbeat. °· Fever. °· Sweating. °· Changes in blood pressure. °· Not having a normal number of bowel movements. °· Changes in bowel patterns. This may include going to the bathroom less often or not at all. °· A sense of fullness in the rectum but being unable to pass stool. °· Pain or cramps in the abdominal area. These often happen after meals. °· Thin, watery discharge from the rectum. ° °How is this diagnosed? °This condition may be diagnosed based on your symptoms and an exam of your rectum. Sometimes X-rays or lab tests are done to confirm the diagnosis and to check for other problems. °How is this treated? °This condition may be treated by: °· Having your health care provider remove the stool using a gloved finger. °· Taking medicine. °· A suppository or enema given in the rectum to soften the stool, which can stimulate a bowel  movement. ° °Follow these instructions at home: °Eating and drinking °· Drink enough fluid to keep your urine clear or pale yellow. °· Include a lot of fiber in your diet. Foods with a lot of fiber include fruits, vegetables, and oatmeal. °· If you begin to get constipated, increase the amount of fiber in your diet. °General instructions °· Develop bowel habits. An example of a bowel habit is having a bowel movement right after breakfast every day. Be sure to give yourself enough time on the toilet. This may require using enemas, bowel softeners, or suppositories at home, as directed by your health care provider. It may also include using mineral oil or olive oil. °· Exercise regularly. °· Take over-the-counter and prescription medicines only as told by your health care provider. °Contact a health care provider if: °· You have ongoing pain in your rectum. °· You need to use an enema or a suppository more than 2 times a week. °· You have rectal bleeding. °· You continue to have problems. The problems may include not being able to go to the bathroom and long-term (chronic) constipation. °· You have pain in your abdomen. °· You have thin, pencil-like stools. °Get help right away if: °· You have black or tarry stools. °This information is not intended to replace advice given to you by your health care provider. Make sure you discuss any questions you have with your health care provider. °Document Released: 09/23/2003 Document Revised: 08/04/2015 Document Reviewed: 07/06/2015 °Elsevier Interactive Patient Education © 2018 Elsevier Inc. ° °

## 2017-06-26 ENCOUNTER — Encounter: Payer: Self-pay | Admitting: Sports Medicine

## 2017-06-26 ENCOUNTER — Ambulatory Visit: Payer: Medicaid Other | Admitting: Sports Medicine

## 2017-06-26 DIAGNOSIS — K5641 Fecal impaction: Secondary | ICD-10-CM | POA: Diagnosis not present

## 2017-06-26 DIAGNOSIS — F418 Other specified anxiety disorders: Secondary | ICD-10-CM

## 2017-06-26 MED ORDER — ESCITALOPRAM OXALATE 10 MG PO TABS: 10.0000 mg | ORAL_TABLET | Freq: Every day | ORAL | 3 refills | Status: DC

## 2017-06-26 NOTE — Assessment & Plan Note (Signed)
We have tried multiple medications, Zoloft, Trintellix, Viibryd, Cymbalta, Lexapro seems to have worked the best, she does not desire to ramp up her Trintellix to 20, she would just like to go back to 10 of Lexapro which I think is appropriate. She would also like a second opinion from psychiatry downstairs. I like to see her back in 1 month.

## 2017-06-26 NOTE — Assessment & Plan Note (Signed)
No internal or external hemorrhoids on anoscopy at the last visit, she did have a large impacted piece of stool above the dentate line. With Dulcolax suppositories and MiraLAX all of her symptoms have resolved, no bleeding, only a touch of bloating which is likely related to her IBS.

## 2017-06-26 NOTE — Patient Instructions (Signed)
Irritable Bowel Syndrome, Adult Irritable bowel syndrome (IBS) is not one specific disease. It is a group of symptoms that affects the organs responsible for digestion (gastrointestinal or GI tract). To regulate how your GI tract works, your body sends signals back and forth between your intestines and your brain. If you have IBS, there may be a problem with these signals. As a result, your GI tract does not function normally. Your intestines may become more sensitive and overreact to certain things. This is especially true when you eat certain foods or when you are under stress. There are four types of IBS. These may be determined based on the consistency of your stool:  IBS with diarrhea.  IBS with constipation.  Mixed IBS.  Unsubtyped IBS.  It is important to know which type of IBS you have. Some treatments are more likely to be helpful for certain types of IBS. What are the causes? The exact cause of IBS is not known. What increases the risk? You may have a higher risk of IBS if:  You are a woman.  You are younger than 27 years old.  You have a family history of IBS.  You have mental health problems.  You have had bacterial infection of your GI tract.  What are the signs or symptoms? Symptoms of IBS vary from person to person. The main symptom is abdominal pain or discomfort. Additional symptoms usually include one or more of the following:  Diarrhea, constipation, or both.  Abdominal swelling or bloating.  Feeling full or sick after eating a small or regular-size meal.  Frequent gas.  Mucus in the stool.  A feeling of having more stool left after a bowel movement.  Symptoms tend to come and go. They may be associated with stress, psychiatric conditions, or nothing at all. How is this diagnosed? There is no specific test to diagnose IBS. Your health care provider will make a diagnosis based on a physical exam, medical history, and your symptoms. You may have other  tests to rule out other conditions that may be causing your symptoms. These may include:  Blood tests.  X-rays.  CT scan.  Endoscopy and colonoscopy. This is a test in which your GI tract is viewed with a long, thin, flexible tube.  How is this treated? There is no cure for IBS, but treatment can help relieve symptoms. IBS treatment often includes:  Changes to your diet, such as: ? Eating more fiber. ? Avoiding foods that cause symptoms. ? Drinking more water. ? Eating regular, medium-sized portioned meals.  Medicines. These may include: ? Fiber supplements if you have constipation. ? Medicine to control diarrhea (antidiarrheal medicines). ? Medicine to help control muscle spasms in your GI tract (antispasmodic medicines). ? Medicines to help with any mental health issues, such as antidepressants or tranquilizers.  Therapy. ? Talk therapy may help with anxiety, depression, or other mental health issues that can make IBS symptoms worse.  Stress reduction. ? Managing your stress can help keep symptoms under control.  Follow these instructions at home:  Take medicines only as directed by your health care provider.  Eat a healthy diet. ? Avoid foods and drinks with added sugar. ? Include more whole grains, fruits, and vegetables gradually into your diet. This may be especially helpful if you have IBS with constipation. ? Avoid any foods and drinks that make your symptoms worse. These may include dairy products and caffeinated or carbonated drinks. ? Do not eat large meals. ? Drink enough   fluid to keep your urine clear or pale yellow.  Exercise regularly. Ask your health care provider for recommendations of good activities for you.  Keep all follow-up visits as directed by your health care provider. This is important. Contact a health care provider if:  You have constant pain.  You have trouble or pain with swallowing.  You have worsening diarrhea. Get help right away  if:  You have severe and worsening abdominal pain.  You have diarrhea and: ? You have a rash, stiff neck, or severe headache. ? You are irritable, sleepy, or difficult to awaken. ? You are weak, dizzy, or extremely thirsty.  You have bright red blood in your stool or you have black tarry stools.  You have unusual abdominal swelling that is painful.  You vomit continuously.  You vomit blood (hematemesis).  You have both abdominal pain and a fever. This information is not intended to replace advice given to you by your health care provider. Make sure you discuss any questions you have with your health care provider. Document Released: 12/31/2004 Document Revised: 06/02/2015 Document Reviewed: 09/17/2013 Elsevier Interactive Patient Education  2018 Elsevier Inc.  

## 2017-06-26 NOTE — Progress Notes (Signed)
Subjective:    CC: Follow-up  HPI: Fecal impaction: Noted at the last visit on anoscopy, hematochezia has resolved, bloating is still present but significantly better, we did treat her with Dulcolax suppositories and MiraLAX.  Anxiety and depression: Worsening of symptoms, initially in March she was doing very well on Trintellix, we had switched her to Trintellix from Lexapro due to intolerable anorgasmia.  This is improved but her anxiety and depressive symptoms have worsened now and she does not desire to taper Trintellix up to the maximum dose, she would like to go back to Lexapro.  She would also like a second opinion from psychiatry downstairs.  No suicidal or homicidal ideation.  I reviewed the past medical history, family history, social history, surgical history, and allergies today and no changes were needed.  Please see the problem list section below in epic for further details.  Past Medical History: Past Medical History:  Diagnosis Date  . Anxiety   . Asthma   . Depression   . History of chicken pox    as a child  . History of polydrug abuse   . Vaginal Pap smear, abnormal    Past Surgical History: Past Surgical History:  Procedure Laterality Date  . CESAREAN SECTION  07/24/2011   Procedure: CESAREAN SECTION;  Surgeon: Sharene Butters, MD;  Location: [facility];  Service: Gynecology;  Laterality: N/A;  . NO PAST SURGERIES     Social History: Social History   Socioeconomic History  . Marital status: Single    Spouse name: Not on file  . Number of children: Not on file  . Years of education: Not on file  . Highest education level: Not on file  Occupational History  . Not on file  Social Needs  . Financial resource strain: Not on file  . Food insecurity:    Worry: Not on file    Inability: Not on file  . Transportation needs:    Medical: Not on file    Non-medical: Not on file  Tobacco Use  . Smoking status: Never Smoker  . Smokeless tobacco: Never Used    Substance and Sexual Activity  . Alcohol use: No  . Drug use: Yes    Types: Amphetamines, Heroin, "Crack" cocaine    Comment: heroine  . Sexual activity: Yes  Lifestyle  . Physical activity:    Days per week: Not on file    Minutes per session: Not on file  . Stress: Not on file  Relationships  . Social connections:    Talks on phone: Not on file    Gets together: Not on file    Attends religious service: Not on file    Active member of club or organization: Not on file    Attends meetings of clubs or organizations: Not on file    Relationship status: Not on file  Other Topics Concern  . Not on file  Social History Narrative  . Not on file   Family History: Family History  Problem Relation Age of Onset  . Diabetes Mother   . Hypertension Mother   . Hypertension Maternal Grandmother   . Diabetes Maternal Grandmother   . Cancer Maternal Grandmother        Breast  . Cancer Maternal Grandfather        Lung   Allergies: Allergies  Allergen Reactions  . Penicillins Swelling  . Cephalosporins Rash   Medications: See med rec.  Review of Systems: No fevers, chills, night sweats, weight loss,  chest pain, or shortness of breath.   Objective:    General: Well Developed, well nourished, and in no acute distress.  Neuro: Alert and oriented x3, extra-ocular muscles intact, sensation grossly intact.  HEENT: Normocephalic, atraumatic, pupils equal round reactive to light, neck supple, no masses, no lymphadenopathy, thyroid nonpalpable.  Skin: Warm and dry, no rashes. Cardiac: Regular rate and rhythm, no murmurs rubs or gallops, no lower extremity edema.  Respiratory: Clear to auscultation bilaterally. Not using accessory muscles, speaking in full sentences.  Impression and Recommendations:    Fecal impaction in rectum (HCC) No internal or external hemorrhoids on anoscopy at the last visit, she did have a large impacted piece of stool above the dentate line. With Dulcolax  suppositories and MiraLAX all of her symptoms have resolved, no bleeding, only a touch of bloating which is likely related to her IBS.  Depression with anxiety We have tried multiple medications, Zoloft, Trintellix, Viibryd, Cymbalta, Lexapro seems to have worked the best, she does not desire to ramp up her Trintellix to 20, she would just like to go back to 10 of Lexapro which I think is appropriate. She would also like a second opinion from psychiatry downstairs. I like to see her back in 1 month. ___________________________________________ Gwen Her. Dianah Field, M.D., ABFM., CAQSM. Primary Care and [place] Instructor of [place] of [facility] of Medicine

## 2017-07-22 ENCOUNTER — Ambulatory Visit: Payer: Self-pay | Admitting: Sports Medicine

## 2017-07-27 ENCOUNTER — Other Ambulatory Visit: Payer: Self-pay | Admitting: Sports Medicine

## 2017-07-27 DIAGNOSIS — F418 Other specified anxiety disorders: Secondary | ICD-10-CM

## 2017-07-29 ENCOUNTER — Ambulatory Visit: Payer: Self-pay | Admitting: Sports Medicine

## 2017-08-12 ENCOUNTER — Other Ambulatory Visit: Payer: Self-pay

## 2017-08-12 ENCOUNTER — Ambulatory Visit (INDEPENDENT_AMBULATORY_CARE_PROVIDER_SITE_OTHER): Payer: Medicaid Other | Admitting: Psychiatry

## 2017-08-12 ENCOUNTER — Encounter (HOSPITAL_COMMUNITY): Payer: Self-pay | Admitting: Psychiatry

## 2017-08-12 VITALS — BP 110/80 | HR 64 | Ht 66.0 in | Wt 132.0 lb

## 2017-08-12 DIAGNOSIS — F900 Attention-deficit hyperactivity disorder, predominantly inattentive type: Secondary | ICD-10-CM

## 2017-08-12 DIAGNOSIS — F331 Major depressive disorder, recurrent, moderate: Secondary | ICD-10-CM

## 2017-08-12 DIAGNOSIS — F418 Other specified anxiety disorders: Secondary | ICD-10-CM | POA: Diagnosis not present

## 2017-08-12 DIAGNOSIS — F411 Generalized anxiety disorder: Secondary | ICD-10-CM

## 2017-08-12 MED ORDER — BUPROPION HCL ER (SR) 100 MG PO TB12: 100.0000 mg | ORAL_TABLET | Freq: Every day | ORAL | 1 refills | Status: DC
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
## 2017-08-28 ENCOUNTER — Ambulatory Visit: Payer: Self-pay | Admitting: Internal Medicine

## 2017-09-17 ENCOUNTER — Ambulatory Visit (INDEPENDENT_AMBULATORY_CARE_PROVIDER_SITE_OTHER): Payer: Medicaid Other | Admitting: Psychiatry

## 2017-09-17 ENCOUNTER — Encounter (HOSPITAL_COMMUNITY): Payer: Self-pay | Admitting: Psychiatry

## 2017-09-17 DIAGNOSIS — F411 Generalized anxiety disorder: Secondary | ICD-10-CM | POA: Diagnosis not present

## 2017-09-17 DIAGNOSIS — F418 Other specified anxiety disorders: Secondary | ICD-10-CM

## 2017-09-17 DIAGNOSIS — F4010 Social phobia, unspecified: Secondary | ICD-10-CM

## 2017-09-17 DIAGNOSIS — Z818 Family history of other mental and behavioral disorders: Secondary | ICD-10-CM

## 2017-09-17 DIAGNOSIS — F331 Major depressive disorder, recurrent, moderate: Secondary | ICD-10-CM | POA: Diagnosis not present

## 2017-09-17 DIAGNOSIS — F900 Attention-deficit hyperactivity disorder, predominantly inattentive type: Secondary | ICD-10-CM | POA: Diagnosis not present

## 2017-09-17 DIAGNOSIS — F4310 Post-traumatic stress disorder, unspecified: Secondary | ICD-10-CM

## 2017-09-17 MED ORDER — BUPROPION HCL ER (SR) 150 MG PO TB12: ORAL_TABLET | ORAL | 1 refills | Status: DC
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
## 2017-09-29 ENCOUNTER — Ambulatory Visit (INDEPENDENT_AMBULATORY_CARE_PROVIDER_SITE_OTHER): Payer: Medicaid Other | Admitting: Physician Assistant

## 2017-09-29 ENCOUNTER — Encounter: Payer: Self-pay | Admitting: Physician Assistant

## 2017-09-29 VITALS — BP 125/78 | HR 82

## 2017-09-29 DIAGNOSIS — N760 Acute vaginitis: Secondary | ICD-10-CM

## 2017-09-29 DIAGNOSIS — Z113 Encounter for screening for infections with a predominantly sexual mode of transmission: Secondary | ICD-10-CM | POA: Diagnosis not present

## 2017-09-29 MED ORDER — FLUCONAZOLE 150 MG PO TABS: 150.0000 mg | ORAL_TABLET | Freq: Once | ORAL | 0 refills | Status: AC

## 2017-09-29 NOTE — Patient Instructions (Signed)

## 2017-09-29 NOTE — Progress Notes (Signed)
HPI:                                                                Alejandra George is a 27 y.o. female who presents to [place]: [place] today for vaginitis  Vaginal Itching  The patient's primary symptoms include genital itching and vaginal discharge. The patient's pertinent negatives include no genital lesions or genital rash. This is a new problem. She is not pregnant. Associated symptoms include urgency. Pertinent negatives include no abdominal pain, hematuria or vomiting. The vaginal discharge was white. There has been no bleeding. Nothing aggravates the symptoms. She has tried antifungals (Monistat) for the symptoms. The treatment provided no relief. She is sexually active. No, her partner does not have an STD. Contraceptive use: same sex partner. Her menstrual history has been regular.      Depression screen [facility] 2/9 09/29/2017 06/26/2017 11/26/2016  Decreased Interest 0 3 1  Down, Depressed, Hopeless 1 2 0  PHQ - 2 Score 1 5 1   Altered sleeping 1 2 -  Tired, decreased energy 1 3 -  Change in appetite 1 3 -  Feeling bad or failure about yourself  0 0 -  Trouble concentrating 3 3 -  Moving slowly or fidgety/restless 0 2 -  Suicidal thoughts 0 - -  PHQ-9 Score 7 18 -  Difficult doing work/chores - Very difficult -  Some encounter information is confidential and restricted. Go to Review Flowsheets activity to see all data.    GAD 7 : Generalized Anxiety Score 09/29/2017 06/26/2017  Nervous, Anxious, on Edge 1 2  Control/stop worrying 1 2  Worry too much - different things 1 1  Trouble relaxing 1 2  Restless 0 3  Easily annoyed or irritable 0 3  Afraid - awful might happen - 2  Total GAD 7 Score - 15  Anxiety Difficulty - Very difficult  Some encounter information is confidential and restricted. Go to Review Flowsheets activity to see all data.      Past Medical History:  Diagnosis Date  . Anxiety   . Asthma   . Depression   .  History of chicken pox    as a child  . History of polydrug abuse   . Vaginal Pap smear, abnormal    Past Surgical History:  Procedure Laterality Date  . CESAREAN SECTION  07/24/2011   Procedure: CESAREAN SECTION;  Surgeon: Sharene Butters, MD;  Location: [facility];  Service: Gynecology;  Laterality: N/A;  . NO PAST SURGERIES     Social History   Tobacco Use  . Smoking status: Never Smoker  . Smokeless tobacco: Never Used  Substance Use Topics  . Alcohol use: No   family history includes Cancer in her maternal grandfather and maternal grandmother; Diabetes in her maternal grandmother and mother; Hypertension in her maternal grandmother and mother.    ROS: negative except as noted in the HPI  Medications: Current Outpatient Medications  Medication Sig Dispense Refill  . albuterol (PROVENTIL HFA;VENTOLIN HFA) 108 (90 BASE) MCG/ACT inhaler Inhale 2 puffs into the lungs every 4 (four) hours as needed for wheezing or shortness of breath. 1 Inhaler 11  . albuterol (PROVENTIL) (2.5 MG/3ML) 0.083% nebulizer solution Take 3 mLs (2.5 mg total)  by nebulization every 4 (four) hours as needed for wheezing or shortness of breath (please include nebulizer machine, hoses, and mask if needed.). 30 vial 1  . bisacodyl (DULCOLAX) 10 MG suppository Place 1 suppository (10 mg total) rectally 2 (two) times daily. (Patient not taking: Reported on 08/12/2017) 12 suppository 0  . buPROPion (WELLBUTRIN SR) 150 MG 12 hr tablet Dose increased to 150mg  30 tablet 1  . busPIRone (BUSPAR) 15 MG tablet TAKE 1 TABLET(15 MG) BY MOUTH TWICE DAILY 60 tablet 0  . escitalopram (LEXAPRO) 10 MG tablet Take 1 tablet (10 mg total) by mouth daily. 30 tablet 3  . fluconazole (DIFLUCAN) 150 MG tablet Take 1 tablet (150 mg total) by mouth once for 1 dose. 1 tablet 0  . polyethylene glycol (MIRALAX / GLYCOLAX) packet Take 17 g by mouth 2 (two) times daily. Until stooling regularly (Patient not taking: Reported on 08/12/2017) 30  packet 11   No current facility-administered medications for this visit.    Allergies  Allergen Reactions  . Penicillins Swelling  . Cephalosporins Rash       Objective:  BP 125/78   Pulse 82   LMP 08/25/2017 (Approximate)  Gen:  alert, not ill-appearing, no distress, appropriate for age GU: deferred per patient Psych: well-groomed, cooperative, good eye contact, euthymic mood, affect mood-congruent, speech is articulate, and thought processes clear and goal-directed    No results found for this or any previous visit (from the past 72 hour(s)). No results found.    Assessment and Plan: 27 y.o. female with   .Diagnoses and all orders for this visit:  Acute vaginitis -     SureSwab Bacterial Vaginosis/itis -     fluconazole (DIFLUCAN) 150 MG tablet; Take 1 tablet (150 mg total) by mouth once for 1 dose.  Routine screening for STI (sexually transmitted infection) -     SureSwab Bacterial Vaginosis/itis   - acute vaginitis panel collected by self-swab pending - declines serum STI screening, low risk sexual behaviors - treating empirically for candidal vaginitis  - she prefers to follow-up with her PCP regarding her depression and current medications  Patient education and anticipatory guidance given Patient agrees with treatment plan Follow-up as needed if symptoms worsen or fail to improve  Darlyne Russian PA-C

## 2017-10-04 LAB — SURESWAB BACTERIAL VAGINOSIS/ITIS
ATOPOBIUM VAGINAE: NOT DETECTED
BV CATEGORY: UNDETERMINED — AB
C. albicans, DNA: NOT DETECTED
C. glabrata, DNA: NOT DETECTED
C. parapsilosis, DNA: NOT DETECTED
C. tropicalis, DNA: NOT DETECTED
Gardnerella vaginalis: 6 Log (cells/mL)
MEGASPHAERA SPECIES: NOT DETECTED
Trichomonas vaginalis RNA: NOT DETECTED

## 2017-10-06 MED ORDER — METRONIDAZOLE 500 MG PO TABS: 500.0000 mg | ORAL_TABLET | Freq: Two times a day (BID) | ORAL | 0 refills | Status: AC

## 2017-10-06 NOTE — Addendum Note (Signed)
Addended by: Nelson Chimes E on: 10/06/2017 10:13 AM   Modules accepted: Orders

## 2017-10-07 ENCOUNTER — Ambulatory Visit (INDEPENDENT_AMBULATORY_CARE_PROVIDER_SITE_OTHER): Payer: Medicaid Other | Admitting: Sports Medicine

## 2017-10-07 DIAGNOSIS — F900 Attention-deficit hyperactivity disorder, predominantly inattentive type: Secondary | ICD-10-CM

## 2017-10-07 DIAGNOSIS — F418 Other specified anxiety disorders: Secondary | ICD-10-CM

## 2017-10-07 MED ORDER — ESCITALOPRAM OXALATE 5 MG PO TABS: 5.0000 mg | ORAL_TABLET | Freq: Every day | ORAL | 3 refills | Status: DC

## 2017-10-07 MED ORDER — AMPHETAMINE-DEXTROAMPHET ER 10 MG PO CP24
10.0000 mg | ORAL_CAPSULE | ORAL | 0 refills | Status: DC
Start: 2017-10-07 — End: 2017-10-21

## 2017-10-07 NOTE — Assessment & Plan Note (Signed)
Persistent difficulty focusing, we have tried a couple of stimulant medications in the past as well as non-stimulant, Vyvanse and Strattera. Did have somewhat of a crash with Vyvanse. I would like an official evaluation with our psychologists. I am also going to add low-dose Adderall XR, and we will dose adjust every 2 weeks until we see a good response. There is a history of polysubstance abuse but she is clean now for a large period of time.

## 2017-10-07 NOTE — Progress Notes (Signed)
Subjective:    CC: Follow-up  HPI: Anxiety and depression: Doing extremely well with Lexapro, she has in fact dropped her dose down to 5 mg, no suicidal or homicidal ideation, discontinue BuSpar, excessive sedation.  She was only using Wellbutrin for the energy it gave her and for attention but not getting much efficacy.  She has a diagnosis of ADHD from an age of 80, tried Vyvanse but did have somewhat of a crash afterwards.  She is looking to try treatment again with possibly stimulant medications but also would be agreeable for another full adult ADHD evaluation.  I reviewed the past medical history, family history, social history, surgical history, and allergies today and no changes were needed.  Please see the problem list section below in epic for further details.  Past Medical History: Past Medical History:  Diagnosis Date  . Anxiety   . Asthma   . Depression   . History of chicken pox    as a child  . History of polydrug abuse   . Vaginal Pap smear, abnormal    Past Surgical History: Past Surgical History:  Procedure Laterality Date  . CESAREAN SECTION  07/24/2011   Procedure: CESAREAN SECTION;  Surgeon: Sharene Butters, MD;  Location: [facility];  Service: Gynecology;  Laterality: N/A;  . NO PAST SURGERIES     Social History: Social History   Socioeconomic History  . Marital status: Single    Spouse name: Not on file  . Number of children: Not on file  . Years of education: Not on file  . Highest education level: Not on file  Occupational History  . Not on file  Social Needs  . Financial resource strain: Not on file  . Food insecurity:    Worry: Not on file    Inability: Not on file  . Transportation needs:    Medical: Not on file    Non-medical: Not on file  Tobacco Use  . Smoking status: Never Smoker  . Smokeless tobacco: Never Used  Substance and Sexual Activity  . Alcohol use: No  . Drug use: Yes    Types: Amphetamines, Heroin, "Crack" cocaine   Comment: heroine  . Sexual activity: Yes  Lifestyle  . Physical activity:    Days per week: Not on file    Minutes per session: Not on file  . Stress: Not on file  Relationships  . Social connections:    Talks on phone: Not on file    Gets together: Not on file    Attends religious service: Not on file    Active member of club or organization: Not on file    Attends meetings of clubs or organizations: Not on file    Relationship status: Not on file  Other Topics Concern  . Not on file  Social History Narrative  . Not on file   Family History: Family History  Problem Relation Age of Onset  . Diabetes Mother   . Hypertension Mother   . Hypertension Maternal Grandmother   . Diabetes Maternal Grandmother   . Cancer Maternal Grandmother        Breast  . Cancer Maternal Grandfather        Lung   Allergies: Allergies  Allergen Reactions  . Penicillins Swelling  . Cephalosporins Rash   Medications: See med rec.  Review of Systems: No fevers, chills, night sweats, weight loss, chest pain, or shortness of breath.   Objective:    General: Well Developed, well nourished, and  in no acute distress.  Neuro: Alert and oriented x3, extra-ocular muscles intact, sensation grossly intact.  HEENT: Normocephalic, atraumatic, pupils equal round reactive to light, neck supple, no masses, no lymphadenopathy, thyroid nonpalpable.  Skin: Warm and dry, no rashes. Cardiac: Regular rate and rhythm, no murmurs rubs or gallops, no lower extremity edema.  Respiratory: Clear to auscultation bilaterally. Not using accessory muscles, speaking in full sentences.  Impression and Recommendations:    Depression with anxiety Very well controlled with Lexapro 5, discontinued BuSpar. We are going to discontinue Wellbutrin, she is using it more for energy, and because we are going to start a stimulant ADHD medicine I think we would be redundant continuing Wellbutrin.  Attention deficit  disorder Persistent difficulty focusing, we have tried a couple of stimulant medications in the past as well as non-stimulant, Vyvanse and Strattera. Did have somewhat of a crash with Vyvanse. I would like an official evaluation with our psychologists. I am also going to add low-dose Adderall XR, and we will dose adjust every 2 weeks until we see a good response. There is a history of polysubstance abuse but she is clean now for a large period of time.  ___________________________________________ Gwen Her. Dianah Field, M.D., ABFM., CAQSM. Primary Care and [place] Instructor of [place] of [facility] of Medicine

## 2017-10-07 NOTE — Assessment & Plan Note (Signed)
Very well controlled with Lexapro 5, discontinued BuSpar. We are going to discontinue Wellbutrin, she is using it more for energy, and because we are going to start a stimulant ADHD medicine I think we would be redundant continuing Wellbutrin.

## 2017-10-15 ENCOUNTER — Ambulatory Visit (HOSPITAL_COMMUNITY): Payer: Self-pay | Admitting: Psychiatry

## 2017-10-21 ENCOUNTER — Ambulatory Visit (INDEPENDENT_AMBULATORY_CARE_PROVIDER_SITE_OTHER): Payer: Medicaid Other | Admitting: Sports Medicine

## 2017-10-21 ENCOUNTER — Encounter: Payer: Self-pay | Admitting: Sports Medicine

## 2017-10-21 DIAGNOSIS — F900 Attention-deficit hyperactivity disorder, predominantly inattentive type: Secondary | ICD-10-CM

## 2017-10-21 MED ORDER — BUPROPION HCL ER (XL) 150 MG PO TB24: 150.0000 mg | ORAL_TABLET | Freq: Every day | ORAL | 3 refills | Status: DC

## 2017-10-21 MED ORDER — AMPHETAMINE-DEXTROAMPHET ER 20 MG PO CP24: 20.0000 mg | ORAL_CAPSULE | ORAL | 0 refills | Status: DC

## 2017-10-21 NOTE — Progress Notes (Signed)
Subjective:    CC: Follow-up  HPI: We are restarting treatment for Alejandra George's ADHD.  She self discontinued her Wellbutrin, would like to restart this.  Adderall 10 was insufficient.  Agreeable to go up to 20 mg.  Depression and anxiety is controlled, no suicidal or homicidal ideation.  I reviewed the past medical history, family history, social history, surgical history, and allergies today and no changes were needed.  Please see the problem list section below in epic for further details.  Past Medical History: Past Medical History:  Diagnosis Date  . Anxiety   . Asthma   . Depression   . History of chicken pox    as a child  . History of polydrug abuse ([place])   . Vaginal Pap smear, abnormal    Past Surgical History: Past Surgical History:  Procedure Laterality Date  . CESAREAN SECTION  07/24/2011   Procedure: CESAREAN SECTION;  Surgeon: Sharene Butters, MD;  Location: [facility];  Service: Gynecology;  Laterality: N/A;  . NO PAST SURGERIES     Social History: Social History   Socioeconomic History  . Marital status: Single    Spouse name: Not on file  . Number of children: Not on file  . Years of education: Not on file  . Highest education level: Not on file  Occupational History  . Not on file  Social Needs  . Financial resource strain: Not on file  . Food insecurity:    Worry: Not on file    Inability: Not on file  . Transportation needs:    Medical: Not on file    Non-medical: Not on file  Tobacco Use  . Smoking status: Never Smoker  . Smokeless tobacco: Never Used  Substance and Sexual Activity  . Alcohol use: No  . Drug use: Yes    Types: Amphetamines, Heroin, "Crack" cocaine    Comment: heroine  . Sexual activity: Yes  Lifestyle  . Physical activity:    Days per week: Not on file    Minutes per session: Not on file  . Stress: Not on file  Relationships  . Social connections:    Talks on phone: Not on file    Gets together: Not on file    Attends  religious service: Not on file    Active member of club or organization: Not on file    Attends meetings of clubs or organizations: Not on file    Relationship status: Not on file  Other Topics Concern  . Not on file  Social History Narrative  . Not on file   Family History: Family History  Problem Relation Age of Onset  . Diabetes Mother   . Hypertension Mother   . Hypertension Maternal Grandmother   . Diabetes Maternal Grandmother   . Cancer Maternal Grandmother        Breast  . Cancer Maternal Grandfather        Lung   Allergies: Allergies  Allergen Reactions  . Penicillins Swelling  . Cephalosporins Rash   Medications: See med rec.  Review of Systems: No fevers, chills, night sweats, weight loss, chest pain, or shortness of breath.   Objective:    General: Well Developed, well nourished, and in no acute distress.  Neuro: Alert and oriented x3, extra-ocular muscles intact, sensation grossly intact.  HEENT: Normocephalic, atraumatic, pupils equal round reactive to light, neck supple, no masses, no lymphadenopathy, thyroid nonpalpable.  Skin: Warm and dry, no rashes. Cardiac: Regular rate and rhythm, no murmurs  rubs or gallops, no lower extremity edema.  Respiratory: Clear to auscultation bilaterally. Not using accessory muscles, speaking in full sentences.  Impression and Recommendations:    Attention deficit disorder Increasing to 20 mg of Adderall XR. She also desires to go back on Wellbutrin which seemed to be more energizing, adding Wellbutrin 150 XR. She will send me a mychart message in 2 weeks so that we can titrate dose as needed. ___________________________________________ Gwen Her. Dianah Field, M.D., ABFM., CAQSM. Primary Care and [place] Instructor of [place] of [facility] of Medicine

## 2017-10-21 NOTE — Assessment & Plan Note (Signed)
Increasing to 20 mg of Adderall XR. She also desires to go back on Wellbutrin which seemed to be more energizing, adding Wellbutrin 150 XR. She will send me a mychart message in 2 weeks so that we can titrate dose as needed.

## 2017-12-02 ENCOUNTER — Encounter: Payer: Self-pay | Admitting: Sports Medicine

## 2017-12-02 DIAGNOSIS — F900 Attention-deficit hyperactivity disorder, predominantly inattentive type: Secondary | ICD-10-CM

## 2017-12-02 MED ORDER — BUPROPION HCL ER (XL) 300 MG PO TB24: 300.0000 mg | ORAL_TABLET | Freq: Every day | ORAL | 3 refills | Status: DC

## 2018-01-16 ENCOUNTER — Encounter: Payer: Self-pay | Admitting: Physician Assistant

## 2018-01-16 ENCOUNTER — Emergency Department
Admission: EM | Admit: 2018-01-16 | Discharge: 2018-01-16 | Discharge status: Absent without leave | Payer: Self-pay | Source: Home / Self Care

## 2018-01-16 ENCOUNTER — Ambulatory Visit: Payer: Medicaid Other | Admitting: Physician Assistant

## 2018-01-16 VITALS — BP 122/72 | HR 94 | Temp 98.8°F | Ht 66.0 in | Wt 126.4 lb

## 2018-01-16 DIAGNOSIS — R5383 Other fatigue: Secondary | ICD-10-CM | POA: Diagnosis not present

## 2018-01-16 DIAGNOSIS — M542 Cervicalgia: Secondary | ICD-10-CM

## 2018-01-16 DIAGNOSIS — Z23 Encounter for immunization: Secondary | ICD-10-CM | POA: Diagnosis not present

## 2018-01-16 DIAGNOSIS — Z789 Other specified health status: Secondary | ICD-10-CM

## 2018-01-16 MED ORDER — PREDNISONE 50 MG PO TABS: ORAL_TABLET | ORAL | 0 refills | Status: DC

## 2018-01-16 MED ORDER — METHOCARBAMOL 500 MG PO TABS: 500.0000 mg | ORAL_TABLET | Freq: Three times a day (TID) | ORAL | 0 refills | Status: DC

## 2018-01-16 NOTE — Progress Notes (Signed)
Subjective:    Patient ID: Alejandra George, female    DOB: 1990-06-01, 28 y.o.   MRN: [id number]  HPI  Pt is a 28 yo female with a hx of musculoskeletal pain and C7 radiculopathy who presents to the clinic with neck pain that started 3 days ago. She is not aware of any injury or lifting or any new exercise that could have caused this. 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 Pain is pretty constant. Not exacerbated by coughing, sneezing, laughing. She denies any radiation of pain or numbness and tingling into extermities. The only thing that makes it better is lying on her back. She is taking ibuprofen,flexeril warm compresses with little relief. She is also concerned because she gets waves of fatigue and no energy. She is a vegetarian and wonders about her labs.   . Active Ambulatory Problems    Diagnosis Date Noted  . Asthma, mild intermittent, well-controlled 08/28/2011  . Myofascial pain syndrome 12/23/2011  . Annual physical exam 02/21/2012  . ASCUS favor benign 07/02/2012  . Polysubstance dependence including opioid type drug, episodic abuse ([place]) 08/07/2012  . Depression with anxiety 08/07/2012  . Hepatitis C 10/02/2012  . Sacroiliac joint dysfunction of right side 11/24/2012  . 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 Attention deficit disorder 09/14/2015  . Acne 01/05/2016  . Fecal impaction in rectum ([place]) 06/12/2017  . Vegetarian 01/18/2018  . No energy 01/18/2018   Resolved Ambulatory Problems    Diagnosis Date Noted  . Allergic reaction 08/28/2011  . Mastitis, associated with childbirth 08/28/2011  . Mood disorder ([place]) 08/28/2011  . Cervical radiculopathy at C7 12/23/2011  . History of Polysubstance abuse 12/23/2011  . Vaginitis and vulvovaginitis 06/26/2012  . Family planning 07/30/2012  . Polyarthralgia 07/30/2012  . Anxiety state, unspecified 08/07/2012  . Plantar wart of left foot 10/01/2012  . Right shoulder pain 08/05/2013  . Insect  bite 08/05/2013  . Acute maxillary sinusitis 12/20/2013  . Asthma with acute exacerbation 05/23/2014  . Whiplash 07/29/2014  . Exposure to STD 08/01/2014  . Otitis media, right 09/05/2014  . Laryngitis 01/04/2015  . Vaginal discharge 01/05/2016  . Vaginal discharge 11/26/2016  . Whiplash injury to neck 04/08/2017   Past Medical History:  Diagnosis Date  . Anxiety   . Asthma   . Depression   . History of chicken pox   . History of polydrug abuse ([place])   . Vaginal Pap smear, abnormal        Review of Systems  All other systems reviewed and are negative.      Objective:   Physical Exam Vitals signs reviewed.  Constitutional:      Appearance: Normal appearance.  HENT:     Head: Normocephalic and atraumatic.  Musculoskeletal:     Comments: Full ROM of neck. Tenderness to palpation over C6 and C7 and surrounding paraspinal muscles.  Negative Spurling.  Upper extremity strength 5/5.  Hand grip 5/5.  NROM of bilateral shoulders.   Neurological:     General: No focal deficit present.     Mental Status: She is alert and oriented to person, place, and time.  Psychiatric:        Mood and Affect: Mood normal.        Behavior: Behavior normal.           Assessment & Plan:  Marland KitchenMarland KitchenDiyana was seen today for neck pain.  Diagnoses and all orders for this visit:  Neck pain -  methocarbamol (ROBAXIN) 500 MG tablet; Take 1 tablet (500 mg total) by mouth 3 (three) times daily. -     predniSONE (DELTASONE) 50 MG tablet; Take one tablet for 5 days.  Needs flu shot -     Flu Vaccine QUAD 6+ mos PF IM (Fluarix Quad PF)  No energy -     Cancel: Epstein-Barr virus VCA antibody panel -     Epstein-Barr virus VCA antibody panel  Vegetarian -     Cancel: CBC with Differential/Platelet -     Cancel: TSH -     VITAMIN D 25 Hydroxy (Vit-D Deficiency, Fractures) -     Cancel: B12 and Folate Panel -     CBC with Differential/Platelet -     TSH -     Vitamin D 1,25 dihydroxy -      B12 and Folate Panel   Labs ordered to evaluate fatigue.  No injury so did not order any imaging.  Does not appear to be radiating into extremities.  Continue ibuprofen.  Added prednisone and muscle relaxer, robaxin. Pt requested to try something other than flexeril.  Continue biofreeze, icing, massage, tens units for symptomatic care. If not improving consider PT and imaging.  Certainly could be a disc origin will start with symptomatic/conservative treatment at this time.  Follow up with PCP if not improving.

## 2018-01-18 ENCOUNTER — Encounter: Payer: Self-pay | Admitting: Physician Assistant

## 2018-01-18 DIAGNOSIS — R5383 Other fatigue: Secondary | ICD-10-CM | POA: Insufficient documentation

## 2018-01-18 DIAGNOSIS — Z789 Other specified health status: Secondary | ICD-10-CM | POA: Insufficient documentation

## 2018-01-28 LAB — CBC WITH DIFFERENTIAL/PLATELET
Absolute Monocytes: 475 cells/uL (ref 200–950)
Basophils Absolute: 70 cells/uL (ref 0–200)
Basophils Relative: 1.3 %
EOS ABS: 265 {cells}/uL (ref 15–500)
Eosinophils Relative: 4.9 %
HCT: 35.9 % (ref 35.0–45.0)
Hemoglobin: 12.1 g/dL (ref 11.7–15.5)
Lymphs Abs: 1863 cells/uL (ref 850–3900)
MCH: 30 pg (ref 27.0–33.0)
MCHC: 33.7 g/dL (ref 32.0–36.0)
MCV: 88.9 fL (ref 80.0–100.0)
MPV: 10.9 fL (ref 7.5–12.5)
Monocytes Relative: 8.8 %
Neutro Abs: 2727 cells/uL (ref 1500–7800)
Neutrophils Relative %: 50.5 %
Platelets: 290 10*3/uL (ref 140–400)
RBC: 4.04 10*6/uL (ref 3.80–5.10)
RDW: 12.5 % (ref 11.0–15.0)
Total Lymphocyte: 34.5 %
WBC: 5.4 10*3/uL (ref 3.8–10.8)

## 2018-01-28 LAB — B12 AND FOLATE PANEL
Folate: 12.8 ng/mL
Vitamin B-12: 244 pg/mL (ref 200–1100)

## 2018-01-28 LAB — VITAMIN D 25 HYDROXY (VIT D DEFICIENCY, FRACTURES): Vit D, 25-Hydroxy: 32 ng/mL (ref 30–100)

## 2018-01-28 LAB — EPSTEIN-BARR VIRUS VCA ANTIBODY PANEL
EBV NA IgG: 549 U/mL — ABNORMAL HIGH
EBV VCA IgG: 162 U/mL — ABNORMAL HIGH

## 2018-01-28 LAB — TSH: TSH: 0.77 mIU/L

## 2018-01-28 NOTE — Progress Notes (Signed)
Call pt: labs just resulted. Vitamin D normal but low side I would start D3 1000 units a day.  Thyroid stable.  Not anemic.  No recent exposure to mono only past antibodies.  b12 is low normal. I would start b12 1071mcg daily or at least a b-complex vitamin.

## 2018-01-29 ENCOUNTER — Other Ambulatory Visit: Payer: Self-pay | Admitting: Sports Medicine

## 2018-01-29 DIAGNOSIS — F418 Other specified anxiety disorders: Secondary | ICD-10-CM

## 2018-04-15 ENCOUNTER — Encounter: Payer: Self-pay | Admitting: Sports Medicine

## 2018-04-16 ENCOUNTER — Ambulatory Visit (INDEPENDENT_AMBULATORY_CARE_PROVIDER_SITE_OTHER): Payer: BLUE CROSS/BLUE SHIELD | Admitting: Sports Medicine

## 2018-04-16 ENCOUNTER — Other Ambulatory Visit: Payer: Self-pay

## 2018-04-16 ENCOUNTER — Encounter: Payer: Self-pay | Admitting: Sports Medicine

## 2018-04-16 DIAGNOSIS — F418 Other specified anxiety disorders: Secondary | ICD-10-CM

## 2018-04-16 DIAGNOSIS — R5382 Chronic fatigue, unspecified: Secondary | ICD-10-CM

## 2018-04-16 DIAGNOSIS — M797 Fibromyalgia: Secondary | ICD-10-CM | POA: Diagnosis not present

## 2018-04-16 DIAGNOSIS — G9332 Myalgic encephalomyelitis/chronic fatigue syndrome: Secondary | ICD-10-CM | POA: Insufficient documentation

## 2018-04-16 MED ORDER — METHYLPHENIDATE HCL ER (OSM) 27 MG PO TBCR: 27.0000 mg | EXTENDED_RELEASE_TABLET | ORAL | 0 refills | Status: DC

## 2018-04-16 NOTE — Progress Notes (Signed)
Virtual Visit via Telephone   I connected with  Alejandra George  on 04/16/18 by telephone and verified that I am speaking with the correct person using two identifiers.   I discussed the limitations, risks, security and privacy concerns of performing an evaluation and management service by telephone, including the higher likelihood of inaccurate diagnosis and treatment, and the availability of in person appointments.  We also discussed the likely need of an additional face to face encounter for complete and high quality delivery of care.  I also discussed with the patient that there may be a patient responsible charge related to this service. The patient expressed understanding and wishes to proceed.  Subjective:    CC: Fatigue  HPI: Alejandra George continues to have significant fatigue, initially we controlled her anxiety with Lexapro, we added Wellbutrin to help some of her fatigue symptoms but unfortunately this only increased her anxiety.  We have also tried Adderall and Vyvanse in the past for her attention deficit and fatigue symptoms, she did not tolerate these medications.  We have never tried Concerta.  She denies suicidal or homicidal ideation, she has been taking vitamin B12 supplementation for 2 months now without any improvement in her fatigue.  I reviewed the past medical history, family history, social history, surgical history, and allergies today and no changes were needed.  Please see the problem list section below in epic for further details.  Past Medical History: Past Medical History:  Diagnosis Date  . Anxiety   . Asthma   . Depression   . History of chicken pox    as a child  . History of polydrug abuse ([place])   . Vaginal Pap smear, abnormal    Past Surgical History: Past Surgical History:  Procedure Laterality Date  . CESAREAN SECTION  07/24/2011   Procedure: CESAREAN SECTION;  Surgeon: Sharene Butters, MD;  Location: [facility];  Service: Gynecology;  Laterality: N/A;  . NO  PAST SURGERIES     Social History: Social History   Socioeconomic History  . Marital status: Single    Spouse name: Not on file  . Number of children: Not on file  . Years of education: Not on file  . Highest education level: Not on file  Occupational History  . Not on file  Social Needs  . Financial resource strain: Not on file  . Food insecurity:    Worry: Not on file    Inability: Not on file  . Transportation needs:    Medical: Not on file    Non-medical: Not on file  Tobacco Use  . Smoking status: Never Smoker  . Smokeless tobacco: Never Used  Substance and Sexual Activity  . Alcohol use: No  . Drug use: Yes    Types: Amphetamines, Heroin, "Crack" cocaine    Comment: heroine  . Sexual activity: Yes  Lifestyle  . Physical activity:    Days per week: Not on file    Minutes per session: Not on file  . Stress: Not on file  Relationships  . Social connections:    Talks on phone: Not on file    Gets together: Not on file    Attends religious service: Not on file    Active member of club or organization: Not on file    Attends meetings of clubs or organizations: Not on file    Relationship status: Not on file  Other Topics Concern  . Not on file  Social History Narrative  . Not on  file   Family History: Family History  Problem Relation Age of Onset  . Diabetes Mother   . Hypertension Mother   . Hypertension Maternal Grandmother   . Diabetes Maternal Grandmother   . Cancer Maternal Grandmother        Breast  . Cancer Maternal Grandfather        Lung   Allergies: Allergies  Allergen Reactions  . Penicillins Swelling  . Cephalosporins Rash   Medications: See med rec.  Review of Systems: No fevers, chills, night sweats, weight loss, chest pain, or shortness of breath.   Objective:    General: Speaking full sentences, no audible heavy breathing.  Sounds alert and appropriately interactive.  No other physical exam performed due to the non-face to face  nature of this visit.  Impression and Recommendations:    Depression with anxiety Depression anxiety well controlled with Lexapro 10, we added Wellbutrin for augmentation but also to help with fatigue. Unfortunately this worsened her anxiety, and she still has significant fatigue.  Chronic fatigue syndrome with fibromyalgia Has historically done well with trigger point injections with regards to her myofascial symptoms. We initially tried Wellbutrin to help the chronic fatigue, she had relatively low normal vitamin B12 levels with supplementation has not improved her symptoms. At this point we are going to switch to Concerta, she does have some attention deficit symptoms as well, we tried Vyvanse, Adderall without sufficient improvement. 2 weeks of Concerta given, she will do another E-visit in 2 weeks for dose adjustment.  I discussed the above assessment and treatment plan with the patient. The patient was provided an opportunity to ask questions and all were answered. The patient agreed with the plan and demonstrated an understanding of the instructions.   The patient was advised to call back or seek an in-person evaluation if the symptoms worsen or if the condition fails to improve as anticipated.   I provided 21 minutes of non-face-to-face time during this encounter, less than 50% of this was time needed to gather information, review chart, records, and complete documentation.   ___________________________________________ Gwen Her. Dianah Field, M.D., ABFM., CAQSM. Primary Care and Sports Medicine [facility]  Adjunct Professor of [place] of [facility] of Medicine

## 2018-04-16 NOTE — Assessment & Plan Note (Signed)
Has historically done well with trigger point injections with regards to her myofascial symptoms. We initially tried Wellbutrin to help the chronic fatigue, she had relatively low normal vitamin B12 levels with supplementation has not improved her symptoms. At this point we are going to switch to Concerta, she does have some attention deficit symptoms as well, we tried Vyvanse, Adderall without sufficient improvement. 2 weeks of Concerta given, she will do another E-visit in 2 weeks for dose adjustment.

## 2018-04-16 NOTE — Assessment & Plan Note (Signed)
Depression anxiety well controlled with Lexapro 10, we added Wellbutrin for augmentation but also to help with fatigue. Unfortunately this worsened her anxiety, and she still has significant fatigue.

## 2018-04-30 ENCOUNTER — Ambulatory Visit (INDEPENDENT_AMBULATORY_CARE_PROVIDER_SITE_OTHER): Payer: BLUE CROSS/BLUE SHIELD | Admitting: Sports Medicine

## 2018-04-30 ENCOUNTER — Encounter: Payer: Self-pay | Admitting: Sports Medicine

## 2018-04-30 ENCOUNTER — Other Ambulatory Visit: Payer: Self-pay

## 2018-04-30 DIAGNOSIS — Z789 Other specified health status: Secondary | ICD-10-CM

## 2018-04-30 DIAGNOSIS — M797 Fibromyalgia: Secondary | ICD-10-CM | POA: Diagnosis not present

## 2018-04-30 DIAGNOSIS — R5382 Chronic fatigue, unspecified: Secondary | ICD-10-CM | POA: Diagnosis not present

## 2018-04-30 DIAGNOSIS — F900 Attention-deficit hyperactivity disorder, predominantly inattentive type: Secondary | ICD-10-CM

## 2018-04-30 MED ORDER — METHYLPHENIDATE HCL ER (OSM) 27 MG PO TBCR: 27.0000 mg | EXTENDED_RELEASE_TABLET | ORAL | 0 refills | Status: DC

## 2018-04-30 NOTE — Assessment & Plan Note (Signed)
Myofascial symptoms are well controlled with trigger point injections. Initially we tried Wellbutrin to help the chronic fatigue as well as inattention, this resulted in worsening anxiety. Most recently we added Concerta low-dose to help with inattention as well as chronic fatigue months. She reports fantastic improvements in her symptoms and is very happy with how things are going and happy with the dose. Anxiety is continuing to be well controlled with Lexapro. I am going to send in a 54-month supply of Concerta, her pharmacy can hold onto the second and third months and dispense at their discretion. I would like her to come back to see me in person in the office in about 3 months.

## 2018-04-30 NOTE — Progress Notes (Signed)
Virtual Visit via Telephone   I connected with  Alejandra George  on 04/30/18 by telephone and verified that I am speaking with the correct person using two identifiers.   I discussed the limitations, risks, security and privacy concerns of performing an evaluation and management service by telephone, including the higher likelihood of inaccurate diagnosis and treatment, and the availability of in person appointments.  We also discussed the likely need of an additional face to face encounter for complete and high quality delivery of care.  I also discussed with the patient that there may be a patient responsible charge related to this service. The patient expressed understanding and wishes to proceed.  Subjective:    CC: Follow-up  HPI: Chronic fatigue syndrome/fibromyalgia: Now very well controlled with Concerta low-dose.  She had excessive jitteriness with Adderall, and increasing anxiety with Wellbutrin.  Anxiety now is continuing to be well controlled with Lexapro and she is happy with how things are going.  I reviewed the past medical history, family history, social history, surgical history, and allergies today and no changes were needed.  Please see the problem list section below in epic for further details.  Past Medical History: Past Medical History:  Diagnosis Date  . Anxiety   . Asthma   . Depression   . History of chicken pox    as a child  . 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 Vaginal Pap smear, abnormal    Past Surgical History: Past Surgical History:  Procedure Laterality Date  . CESAREAN SECTION  07/24/2011   Procedure: CESAREAN SECTION;  Surgeon: Sharene Butters, MD;  Location: [facility];  Service: Gynecology;  Laterality: N/A;  . NO PAST SURGERIES     Social History: Social History   Socioeconomic History  . Marital status: Single    Spouse name: Not on file  . Number of children: Not on file  . Years of education: Not on file  . Highest education level: Not on  file  Occupational History  . Not on file  Social Needs  . Financial resource strain: Not on file  . Food insecurity:    Worry: Not on file    Inability: Not on file  . Transportation needs:    Medical: Not on file    Non-medical: Not on file  Tobacco Use  . Smoking status: Never Smoker  . Smokeless tobacco: Never Used  Substance and Sexual Activity  . Alcohol use: No  . Drug use: Yes    Types: Amphetamines, Heroin, "Crack" cocaine    Comment: heroine  . Sexual activity: Yes  Lifestyle  . Physical activity:    Days per week: Not on file    Minutes per session: Not on file  . Stress: Not on file  Relationships  . Social connections:    Talks on phone: Not on file    Gets together: Not on file    Attends religious service: Not on file    Active member of club or organization: Not on file    Attends meetings of clubs or organizations: Not on file    Relationship status: Not on file  Other Topics Concern  . Not on file  Social History Narrative  . Not on file   Family History: Family History  Problem Relation Age of Onset  . Diabetes Mother   . Hypertension Mother   . Hypertension Maternal Grandmother   . Diabetes Maternal Grandmother   . Cancer Maternal Grandmother  Breast  . Cancer Maternal Grandfather        Lung   Allergies: Allergies  Allergen Reactions  . Penicillins Swelling  . Cephalosporins Rash   Medications: See med rec.  Review of Systems: No fevers, chills, night sweats, weight loss, chest pain, or shortness of breath.   Objective:    General: Speaking full sentences, no audible heavy breathing.  Sounds alert and appropriately interactive.  No other physical exam performed due to the non-face to face nature of this visit.  Impression and Recommendations:    Chronic fatigue syndrome with fibromyalgia Myofascial symptoms are well controlled with trigger point injections. Initially we tried Wellbutrin to help the chronic fatigue as well  as inattention, this resulted in worsening anxiety. Most recently we added Concerta low-dose to help with inattention as well as chronic fatigue months. She reports fantastic improvements in her symptoms and is very happy with how things are going and happy with the dose. Anxiety is continuing to be well controlled with Lexapro. I am going to send in a 25-month supply of Concerta, her pharmacy can hold onto the second and third months and dispense at their discretion. I would like her to come back to see me in person in the office in about 3 months.  Attention deficit disorder Myofascial symptoms are well controlled with trigger point injections. Initially we tried Wellbutrin to help the chronic fatigue as well as inattention, this resulted in worsening anxiety. Most recently we added Concerta low-dose to help with inattention as well as chronic fatigue months. She reports fantastic improvements in her symptoms and is very happy with how things are going and happy with the dose. Anxiety is continuing to be well controlled with Lexapro. I am going to send in a 9-month supply of Concerta, her pharmacy can hold onto the second and third months and dispense at their discretion. I would like her to come back to see me in person in the office in about 3 months.  Vegetarian Due for some routine labs. She will come in fasting at her leisure.   I discussed the above assessment and treatment plan with the patient. The patient was provided an opportunity to ask questions and all were answered. The patient agreed with the plan and demonstrated an understanding of the instructions.   The patient was advised to call back or seek an in-person evaluation if the symptoms worsen or if the condition fails to improve as anticipated.   I provided 21 minutes of non-face-to-face time during this encounter, less than 50% of this was time needed to gather information, review chart, records, and complete  documentation.   ___________________________________________ Gwen Her. Dianah Field, M.D., ABFM., CAQSM. Primary Care and Sports Medicine [facility]  Adjunct Professor of [place] of [facility] of Medicine

## 2018-04-30 NOTE — Assessment & Plan Note (Signed)
Due for some routine labs. She will come in fasting at her leisure.

## 2018-04-30 NOTE — Assessment & Plan Note (Signed)
Myofascial symptoms are well controlled with trigger point injections. Initially we tried Wellbutrin to help the chronic fatigue as well as inattention, this resulted in worsening anxiety. Most recently we added Concerta low-dose to help with inattention as well as chronic fatigue months. She reports fantastic improvements in her symptoms and is very happy with how things are going and happy with the dose. Anxiety is continuing to be well controlled with Lexapro. I am going to send in a 78-month supply of Concerta, her pharmacy can hold onto the second and third months and dispense at their discretion. I would like her to come back to see me in person in the office in about 3 months.

## 2018-05-05 ENCOUNTER — Encounter: Payer: Self-pay | Admitting: Sports Medicine

## 2018-05-05 ENCOUNTER — Ambulatory Visit (INDEPENDENT_AMBULATORY_CARE_PROVIDER_SITE_OTHER): Payer: BLUE CROSS/BLUE SHIELD | Admitting: Sports Medicine

## 2018-05-05 DIAGNOSIS — F900 Attention-deficit hyperactivity disorder, predominantly inattentive type: Secondary | ICD-10-CM | POA: Diagnosis not present

## 2018-05-05 DIAGNOSIS — M797 Fibromyalgia: Secondary | ICD-10-CM

## 2018-05-05 DIAGNOSIS — G9332 Myalgic encephalomyelitis/chronic fatigue syndrome: Secondary | ICD-10-CM

## 2018-05-05 DIAGNOSIS — R5382 Chronic fatigue, unspecified: Secondary | ICD-10-CM | POA: Diagnosis not present

## 2018-05-05 MED ORDER — METHYLPHENIDATE HCL ER (OSM) 36 MG PO TBCR: 36.0000 mg | EXTENDED_RELEASE_TABLET | ORAL | 0 refills | Status: DC

## 2018-05-05 NOTE — Assessment & Plan Note (Signed)
Myofascial symptoms have been historically well controlled trigger point injections, Wellbutrin was initially started to help chronic fatigue, and intention but this did resulted in worsening anxiety. Concerta 27 mg initially provided good efficacy, she is having some increasing fatigue, I am increasing to 36 mg of Concerta taken first thing in the morning. Anxiety continues to be well controlled with Lexapro, return to see me in about a month to reevaluate the new dose.

## 2018-05-05 NOTE — Progress Notes (Signed)
Subjective:    CC: Fatigue  HPI: This is a pleasant 28 year old female with chronic fatigue syndrome, initially we got a good response with 27 mg of Concerta, she has been taking at about 10:00 in the morning, she does endorse that she wakes up around 8:00, drinks her coffee, but still has difficulty getting out of bed.  I reviewed the past medical history, family history, social history, surgical history, and allergies today and no changes were needed.  Please see the problem list section below in epic for further details.  Past Medical History: Past Medical History:  Diagnosis Date  . Anxiety   . Asthma   . Depression   . History of chicken pox    as a child  . 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 Vaginal Pap smear, abnormal    Past Surgical History: Past Surgical History:  Procedure Laterality Date  . CESAREAN SECTION  07/24/2011   Procedure: CESAREAN SECTION;  Surgeon: Sharene Butters, MD;  Location: [facility];  Service: Gynecology;  Laterality: N/A;  . NO PAST SURGERIES     Social History: Social History   Socioeconomic History  . Marital status: Single    Spouse name: Not on file  . Number of children: Not on file  . Years of education: Not on file  . Highest education level: Not on file  Occupational History  . Not on file  Social Needs  . Financial resource strain: Not on file  . Food insecurity:    Worry: Not on file    Inability: Not on file  . Transportation needs:    Medical: Not on file    Non-medical: Not on file  Tobacco Use  . Smoking status: Never Smoker  . Smokeless tobacco: Never Used  Substance and Sexual Activity  . Alcohol use: No  . Drug use: Yes    Types: Amphetamines, Heroin, "Crack" cocaine    Comment: heroine  . Sexual activity: Yes  Lifestyle  . Physical activity:    Days per week: Not on file    Minutes per session: Not on file  . Stress: Not on file  Relationships  . Social connections:    Talks on phone: Not on file    Gets  together: Not on file    Attends religious service: Not on file    Active member of club or organization: Not on file    Attends meetings of clubs or organizations: Not on file    Relationship status: Not on file  Other Topics Concern  . Not on file  Social History Narrative  . Not on file   Family History: Family History  Problem Relation Age of Onset  . Diabetes Mother   . Hypertension Mother   . Hypertension Maternal Grandmother   . Diabetes Maternal Grandmother   . Cancer Maternal Grandmother        Breast  . Cancer Maternal Grandfather        Lung   Allergies: Allergies  Allergen Reactions  . Penicillins Swelling  . Cephalosporins Rash   Medications: See med rec.  Review of Systems: No fevers, chills, night sweats, weight loss, chest pain, or shortness of breath.   Objective:    General: Well Developed, well nourished, and in no acute distress.  Neuro: Alert and oriented x3, extra-ocular muscles intact, sensation grossly intact.  HEENT: Normocephalic, atraumatic, pupils equal round reactive to light, neck supple, no masses, no lymphadenopathy, thyroid nonpalpable.  Skin: Warm and dry,  no rashes. Cardiac: Regular rate and rhythm, no murmurs rubs or gallops, no lower extremity edema.  Respiratory: Clear to auscultation bilaterally. Not using accessory muscles, speaking in full sentences.  Impression and Recommendations:    Chronic fatigue syndrome with fibromyalgia Myofascial symptoms have been historically well controlled trigger point injections, Wellbutrin was initially started to help chronic fatigue, and intention but this did resulted in worsening anxiety. Concerta 27 mg initially provided good efficacy, she is having some increasing fatigue, I am increasing to 36 mg of Concerta taken first thing in the morning. Anxiety continues to be well controlled with Lexapro, return to see me in about a month to reevaluate the new dose.  Attention deficit disorder  Myofascial symptoms have been historically well controlled trigger point injections, Wellbutrin was initially started to help chronic fatigue, and intention but this did resulted in worsening anxiety. Concerta 27 mg initially provided good efficacy, she is having some increasing fatigue, I am increasing to 36 mg of Concerta taken first thing in the morning. Anxiety continues to be well controlled with Lexapro, return to see me in about a month to reevaluate the new dose.   ___________________________________________ Gwen Her. Dianah Field, M.D., ABFM., CAQSM. Primary Care and Sports Medicine [facility]  Adjunct Professor of [place] of [facility] of Medicine

## 2018-05-06 LAB — COMPLETE METABOLIC PANEL WITHOUT GFR
AG Ratio: 1.6 (calc) (ref 1.0–2.5)
ALT: 9 U/L (ref 6–29)
AST: 13 U/L (ref 10–30)
Albumin: 4.4 g/dL (ref 3.6–5.1)
Alkaline phosphatase (APISO): 49 U/L (ref 31–125)
BUN: 12 mg/dL (ref 7–25)
CO2: 27 mmol/L (ref 20–32)
Calcium: 9.8 mg/dL (ref 8.6–10.2)
Chloride: 102 mmol/L (ref 98–110)
Creat: 0.77 mg/dL (ref 0.50–1.10)
GFR, Est African American: 123 mL/min/1.73m2
GFR, Est Non African American: 106 mL/min/1.73m2
Globulin: 2.8 g/dL (ref 1.9–3.7)
Glucose, Bld: 97 mg/dL (ref 65–99)
Potassium: 3.7 mmol/L (ref 3.5–5.3)
Sodium: 137 mmol/L (ref 135–146)
Total Bilirubin: 0.4 mg/dL (ref 0.2–1.2)
Total Protein: 7.2 g/dL (ref 6.1–8.1)

## 2018-05-06 LAB — CBC
HCT: 36.7 % (ref 35.0–45.0)
Hemoglobin: 12.5 g/dL (ref 11.7–15.5)
MCH: 30.3 pg (ref 27.0–33.0)
MCHC: 34.1 g/dL (ref 32.0–36.0)
MCV: 89.1 fL (ref 80.0–100.0)
MPV: 11.9 fL (ref 7.5–12.5)
Platelets: 298 10*3/uL (ref 140–400)
RBC: 4.12 10*6/uL (ref 3.80–5.10)
RDW: 12.7 % (ref 11.0–15.0)
WBC: 7.2 10*3/uL (ref 3.8–10.8)

## 2018-05-06 LAB — HEMOGLOBIN A1C
Hgb A1c MFr Bld: 4.8 % of total Hgb (ref <5.7)
Mean Plasma Glucose: 91 (calc)
eAG (mmol/L): 5 (calc)

## 2018-05-06 LAB — TSH: TSH: 0.6 mIU/L

## 2018-05-06 LAB — LIPID PANEL W/REFLEX DIRECT LDL
Cholesterol: 168 mg/dL (ref <200)
HDL: 60 mg/dL (ref >=50)
LDL Cholesterol (Calc): 84 mg/dL (calc)
Non-HDL Cholesterol (Calc): 108 mg/dL (calc) (ref <130)
Total CHOL/HDL Ratio: 2.8 (calc) (ref <5.0)
Triglycerides: 143 mg/dL (ref <150)

## 2018-05-06 LAB — VITAMIN D 25 HYDROXY (VIT D DEFICIENCY, FRACTURES): Vit D, 25-Hydroxy: 38 ng/mL (ref 30–100)

## 2018-06-11 ENCOUNTER — Telehealth: Payer: Self-pay | Admitting: Sports Medicine

## 2018-06-11 DIAGNOSIS — M797 Fibromyalgia: Secondary | ICD-10-CM

## 2018-06-11 MED ORDER — METHYLPHENIDATE HCL ER (OSM) 36 MG PO TBCR: 36.0000 mg | EXTENDED_RELEASE_TABLET | ORAL | 0 refills | Status: DC

## 2018-06-11 NOTE — Telephone Encounter (Signed)
Left message for patient that she would need a follow up in the next week. Please schedule.

## 2018-06-11 NOTE — Telephone Encounter (Signed)
Patient called in and stated that she was needing a refill of brand Concerta only and it looks like per the last note she is due for a follow up. Please advise if she need Doxemity or in office visit. Please advise.

## 2018-06-11 NOTE — Telephone Encounter (Signed)
It looks like the dose is working well, I will refill it but yes we do need a Doximity visit at some point this week or next week.Is like the dose is working well, I will refill it but yes we do need a Doximity visit at some point this week or next week.

## 2018-07-05 ENCOUNTER — Encounter: Payer: Self-pay | Admitting: Sports Medicine
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
## 2018-12-15 ENCOUNTER — Ambulatory Visit: Payer: BLUE CROSS/BLUE SHIELD | Admitting: Sports Medicine

## 2019-01-18 ENCOUNTER — Other Ambulatory Visit: Payer: Self-pay

## 2019-01-18 ENCOUNTER — Ambulatory Visit (INDEPENDENT_AMBULATORY_CARE_PROVIDER_SITE_OTHER): Payer: 59 | Admitting: Sports Medicine

## 2019-01-18 ENCOUNTER — Encounter: Payer: Self-pay | Admitting: Sports Medicine

## 2019-01-18 DIAGNOSIS — R200 Anesthesia of skin: Secondary | ICD-10-CM | POA: Diagnosis not present

## 2019-01-18 DIAGNOSIS — J4520 Mild intermittent asthma, uncomplicated: Secondary | ICD-10-CM | POA: Diagnosis not present

## 2019-01-18 DIAGNOSIS — F418 Other specified anxiety disorders: Secondary | ICD-10-CM

## 2019-01-18 DIAGNOSIS — F900 Attention-deficit hyperactivity disorder, predominantly inattentive type: Secondary | ICD-10-CM

## 2019-01-18 DIAGNOSIS — R202 Paresthesia of skin: Secondary | ICD-10-CM

## 2019-01-18 MED ORDER — ALBUTEROL SULFATE HFA 108 (90 BASE) MCG/ACT IN AERS: 2.0000 | INHALATION_SPRAY | Freq: Four times a day (QID) | RESPIRATORY_TRACT | 3 refills | Status: DC | PRN

## 2019-01-18 MED ORDER — VIIBRYD 20 MG PO TABS: 1.0000 | ORAL_TABLET | Freq: Every day | ORAL | 3 refills | Status: DC

## 2019-01-18 MED ORDER — LISDEXAMFETAMINE DIMESYLATE 10 MG PO CAPS: 10.0000 mg | ORAL_CAPSULE | Freq: Every day | ORAL | 0 refills | Status: DC

## 2019-01-18 NOTE — Assessment & Plan Note (Signed)
Several months of facial numbness on the right, intermittent visual loss, dysdiadochokinesis on the left. In a 29 year old female we certainly need to rule out a demyelinating process, adding a brain MRI with and without contrast.

## 2019-01-18 NOTE — Assessment & Plan Note (Signed)
Long history of attention deficit disorder, currently on low-dose Concerta but has done well with Vyvanse in the past.   Would like to try Vyvanse again, starting low-dose per patient request, we can do dose titrations monthly.

## 2019-01-18 NOTE — Assessment & Plan Note (Signed)
Under adequate control, refilling albuterol.

## 2019-01-18 NOTE — Assessment & Plan Note (Addendum)
Long history, no suicidal or homicidal ideation. Has not responded well to Lexapro, Celexa, Trintellix, Cymbalta in the past. Anorgasmia with the older antidepressants and insufficient relief with Trintellix. We are going to switch to 20 mg of Viibryd.

## 2019-01-18 NOTE — Progress Notes (Signed)
    Procedures performed today:    None.  Independent interpretation of tests performed by another provider:   None.  Impression and Recommendations:    Numbness and tingling of right face Several months of facial numbness on the right, intermittent visual loss, dysdiadochokinesis on the left. In a 29 year old female we certainly need to rule out a demyelinating process, adding a brain MRI with and without contrast.  Depression with anxiety Long history, no suicidal or homicidal ideation. Has not responded well to Lexapro, Celexa, Trintellix, Cymbalta in the past. Anorgasmia with the older antidepressants and insufficient relief with Trintellix. We are going to switch to 20 mg of Viibryd.  Attention deficit disorder Long history of attention deficit disorder, currently on low-dose Concerta but has done well with Vyvanse in the past.   Would like to try Vyvanse again, starting low-dose per patient request, we can do dose titrations monthly.  Asthma, mild intermittent, well-controlled Under adequate control, refilling albuterol.    ___________________________________________ Gwen Her. Dianah Field, M.D., ABFM., CAQSM. Primary Care and [place] Instructor of [place] of [facility] of Medicine

## 2019-01-19 ENCOUNTER — Ambulatory Visit (INDEPENDENT_AMBULATORY_CARE_PROVIDER_SITE_OTHER): Payer: 59

## 2019-01-19 ENCOUNTER — Telehealth: Payer: Self-pay

## 2019-01-19 ENCOUNTER — Telehealth: Payer: Self-pay | Admitting: Sports Medicine

## 2019-01-19 DIAGNOSIS — R200 Anesthesia of skin: Secondary | ICD-10-CM | POA: Diagnosis not present

## 2019-01-19 DIAGNOSIS — R202 Paresthesia of skin: Secondary | ICD-10-CM | POA: Diagnosis not present

## 2019-01-19 MED ORDER — GADOBUTROL 1 MMOL/ML IV SOLN
6.0000 mL | Freq: Once | INTRAVENOUS | Status: AC | PRN
  Administered 2019-01-19: 6 mL via INTRAVENOUS

## 2019-01-19 NOTE — Telephone Encounter (Signed)
Received fax from Covermymeds that viibryd requires a PA. Information has been sent to the insurance company. Awaiting determination.

## 2019-01-19 NOTE — Telephone Encounter (Signed)
Pt called and stated that her insurance company will pay for generic Rx's and requested that we call the pharmacy and let them know she would prefer generics. Looking at pt's chart and the Rx's that were filled yesterday, none of them were prescribed as DAW. Notified pt that they were written as "generic substitution permitted" and that the pharmacy should fill the Rx's as generic, as long as it comes in generic, and that she can just ask the pharmacy for generics as a reminder. Pt aware that Viibryd is needing a PA and that it is in process. Pt also stated that the MRI ordered will also need a PA. Informed her that the PA for that is in process.

## 2019-01-20 DIAGNOSIS — F418 Other specified anxiety disorders: Secondary | ICD-10-CM

## 2019-01-20 MED ORDER — ESCITALOPRAM OXALATE 10 MG PO TABS: 10.0000 mg | ORAL_TABLET | Freq: Every day | ORAL | 0 refills | Status: DC

## 2019-01-20 NOTE — Telephone Encounter (Signed)
Here is a copy and paste from my note when I ordered it:  Depression with anxiety Long history, no suicidal or homicidal ideation. Has not responded well to Lexapro, Celexa, Trintellix, Cymbalta in the past. Anorgasmia with the older antidepressants and insufficient relief with Trintellix. We are going to switch to 20 mg of Viibryd.

## 2019-01-20 NOTE — Telephone Encounter (Signed)
I received a fax from patient insurance that the PA for Viibryd was denied. She will have to try and fail another medication in the same class before her insurance will approve this for her. Do you have any recommendations? Please advise .

## 2019-01-21 NOTE — Telephone Encounter (Signed)
I called the pharmacy and the patient has picked up both medications. They were both covered for Viibryrd and Vyvanse. No other questions.

## 2019-02-16 DIAGNOSIS — F900 Attention-deficit hyperactivity disorder, predominantly inattentive type: Secondary | ICD-10-CM

## 2019-02-16 MED ORDER — LISDEXAMFETAMINE DIMESYLATE 10 MG PO CAPS: 10.0000 mg | ORAL_CAPSULE | Freq: Every day | ORAL | 0 refills | Status: DC

## 2019-02-17 ENCOUNTER — Other Ambulatory Visit: Payer: Self-pay | Admitting: Sports Medicine

## 2019-02-17 ENCOUNTER — Ambulatory Visit: Payer: Self-pay | Admitting: Sports Medicine

## 2019-02-17 DIAGNOSIS — F418 Other specified anxiety disorders: Secondary | ICD-10-CM

## 2019-02-17 MED ORDER — ESCITALOPRAM OXALATE 10 MG PO TABS: 10.0000 mg | ORAL_TABLET | Freq: Every day | ORAL | 0 refills | Status: DC

## 2019-02-22 ENCOUNTER — Other Ambulatory Visit: Payer: Self-pay | Admitting: Sports Medicine

## 2019-02-23 ENCOUNTER — Telehealth (INDEPENDENT_AMBULATORY_CARE_PROVIDER_SITE_OTHER): Payer: 59 | Admitting: Sports Medicine

## 2019-02-23 DIAGNOSIS — K629 Disease of anus and rectum, unspecified: Secondary | ICD-10-CM

## 2019-02-23 DIAGNOSIS — F418 Other specified anxiety disorders: Secondary | ICD-10-CM | POA: Diagnosis not present

## 2019-02-23 DIAGNOSIS — F900 Attention-deficit hyperactivity disorder, predominantly inattentive type: Secondary | ICD-10-CM

## 2019-02-23 DIAGNOSIS — K602 Anal fissure, unspecified: Secondary | ICD-10-CM | POA: Insufficient documentation

## 2019-02-23 MED ORDER — LISDEXAMFETAMINE DIMESYLATE 30 MG PO CAPS: 30.0000 mg | ORAL_CAPSULE | Freq: Every day | ORAL | 0 refills | Status: DC

## 2019-02-23 NOTE — Assessment & Plan Note (Signed)
Alejandra George has not noted much improvement yet with 10 mg of Vyvanse, we are to bump it up to 30 mg. We will continue to dose titrate monthly until we find the lowest effective dose.

## 2019-02-23 NOTE — Assessment & Plan Note (Signed)
Alejandra George has noted a couple of bumps on her anus, she will come in for a physical exam, it does sound like a hemorrhoid.

## 2019-02-23 NOTE — Progress Notes (Signed)
   Virtual Visit via WebEx/MyChart   I connected with  Alejandra George  on 02/23/19 via WebEx/MyChart/Doximity Video and verified that I am speaking with the correct person using two identifiers.   I discussed the limitations, risks, security and privacy concerns of performing an evaluation and management service by WebEx/MyChart/Doximity Video, including the higher likelihood of inaccurate diagnosis and treatment, and the availability of in person appointments.  We also discussed the likely need of an additional face to face encounter for complete and high quality delivery of care.  I also discussed with the patient that there may be a patient responsible charge related to this service. The patient expressed understanding and wishes to proceed.  Provider location is either at home or medical facility. Patient location is at their home, different from provider location. People involved in care of the patient during this telehealth encounter were myself, my nurse/medical assistant, and my front office/scheduling team member.  Review of Systems: No fevers, chills, night sweats, weight loss, chest pain, or shortness of breath.   Objective Findings:    General: Speaking full sentences, no audible heavy breathing.  Sounds alert and appropriately interactive.  Appears well.  Face symmetric.  Extraocular movements intact.  Pupils equal and round.  No nasal flaring or accessory muscle use visualized.  Independent interpretation of tests performed by another provider:   None.  Impression and Recommendations:    Attention deficit disorder Alejandra George has not noted much improvement yet with 10 mg of Vyvanse, we are to bump it up to 30 mg. We will continue to dose titrate monthly until we find the lowest effective dose.  Depression with anxiety We had quite some difficulty getting Viibryd approved, ultimately she was able to start it, 20 mg, this was too high of a dose so we drop back down to a half tab, at  this point I think she is ready go back up to a full tab of Viibryd, she still experiences anxiety and depression as well as early morning awakenings with insomnia. No suicidal or homicidal ideation.  Anal disorder Alejandra George has noted a couple of bumps on her anus, she will come in for a physical exam, it does sound like a hemorrhoid.   I discussed the above assessment and treatment plan with the patient. The patient was provided an opportunity to ask questions and all were answered. The patient agreed with the plan and demonstrated an understanding of the instructions.   The patient was advised to call back or seek an in-person evaluation if the symptoms worsen or if the condition fails to improve as anticipated.   I provided 30 minutes of face to face and non-face-to-face time during this encounter date, time was needed to gather information, review chart, records, communicate/coordinate with staff remotely, as well as complete documentation.   ___________________________________________ Gwen Her. Dianah Field, M.D., ABFM., CAQSM. Primary Care and [place] Instructor of [place] of [facility] of Medicine

## 2019-02-23 NOTE — Assessment & Plan Note (Signed)
We had quite some difficulty getting Viibryd approved, ultimately she was able to start it, 20 mg, this was too high of a dose so we drop back down to a half tab, at this point I think she is ready go back up to a full tab of Viibryd, she still experiences anxiety and depression as well as early morning awakenings with insomnia. No suicidal or homicidal ideation.

## 2019-02-24 ENCOUNTER — Telehealth: Payer: 59 | Admitting: Sports Medicine

## 2019-03-05 ENCOUNTER — Ambulatory Visit (INDEPENDENT_AMBULATORY_CARE_PROVIDER_SITE_OTHER): Payer: 59 | Admitting: Sports Medicine

## 2019-03-05 ENCOUNTER — Encounter: Payer: Self-pay | Admitting: Sports Medicine

## 2019-03-05 ENCOUNTER — Other Ambulatory Visit: Payer: Self-pay

## 2019-03-05 DIAGNOSIS — R200 Anesthesia of skin: Secondary | ICD-10-CM

## 2019-03-05 DIAGNOSIS — F418 Other specified anxiety disorders: Secondary | ICD-10-CM | POA: Diagnosis not present

## 2019-03-05 DIAGNOSIS — F900 Attention-deficit hyperactivity disorder, predominantly inattentive type: Secondary | ICD-10-CM

## 2019-03-05 DIAGNOSIS — R202 Paresthesia of skin: Secondary | ICD-10-CM

## 2019-03-05 DIAGNOSIS — K602 Anal fissure, unspecified: Secondary | ICD-10-CM

## 2019-03-05 MED ORDER — DOCUSATE SODIUM 100 MG PO CAPS: 100.0000 mg | ORAL_CAPSULE | Freq: Three times a day (TID) | ORAL | 3 refills | Status: DC | PRN

## 2019-03-05 MED ORDER — NITROGLYCERIN 0.4 % RE OINT: 1.0000 [in_us] | TOPICAL_OINTMENT | Freq: Two times a day (BID) | RECTAL | 3 refills | Status: DC | PRN

## 2019-03-05 NOTE — Patient Instructions (Signed)
Anal Fissure, Adult  An anal fissure is a small tear or crack in the tissue of the anus. Bleeding from a fissure usually stops on its own within a few minutes. However, bleeding will often occur again with each bowel movement until the fissure heals. What are the causes? This condition is usually caused by passing a large or hard stool (feces). Other causes include:  Constipation.  Frequent diarrhea.  Inflammatory bowel disease (Crohn's disease or ulcerative colitis).  Childbirth.  Infections.  Anal sex. What are the signs or symptoms? Symptoms of this condition include:  Bleeding from the rectum.  Small amounts of blood seen on your stool, on the toilet paper, or in the toilet after a bowel movement. The blood coats the outside of the stool and is not mixed with the stool.  Painful bowel movements.  Itching or irritation around the anus. How is this diagnosed? A health care provider may diagnose this condition by closely examining the anal area. An anal fissure can usually be seen with careful inspection. In some cases, a rectal exam may be performed, or a short tube (anoscope) may be used to examine the anal canal. How is this treated? Initial treatment for this condition may include:  Taking steps to avoid constipation. This may include making changes to your diet, such as increasing your intake of fiber or fluid.  Taking fiber supplements. These supplements can soften your stool to help make bowel movements easier. Your health care provider may also prescribe a stool softener if your stool is hard.  Taking sitz baths. This may help to heal the tear.  Using medicated creams or ointments. These may be prescribed to lessen discomfort. Treatments that are sometimes used if initial treatments do not work well or if the condition is more severe may include:  Botulinum injection.  Surgery to repair the fissure. Follow these instructions at home: Eating and  drinking   Avoid foods that may cause constipation, such as bananas, milk, and other dairy products.  Eat all fruits, except bananas.  Drink enough fluid to keep your urine pale yellow.  Eat foods that are high in fiber, such as beans, whole grains, and fresh fruits and vegetables. General instructions   Take over-the-counter and prescription medicines only as told by your health care provider.  Use creams or ointments only as told by your health care provider.  Keep the anal area clean and dry.  Take sitz baths as told by your health care provider. Do not use soap in the sitz baths.  Keep all follow-up visits as told by your health care provider. This is important. Contact a health care provider if you have:  More bleeding.  A fever.  Diarrhea that is mixed with blood.  Pain that continues.  Ongoing problems that are getting worse rather than better. Summary  An anal fissure is a small tear or crack in the tissue of the anus. This condition is usually caused by passing a large or hard stool (feces). Other causes include constipation and frequent diarrhea.  Initial treatment for this condition may include taking steps to avoid constipation, such as increasing your intake of fiber or fluid.  Follow instructions for care as told by your health care provider.  Contact your health care provider if you have more bleeding or your problem is getting worse rather than better.  Keep all follow-up visits as told by your health care provider. This is important. This information is not intended to replace advice given   to you by your health care provider. Make sure you discuss any questions you have with your health care provider. Document Revised: 06/12/2017 Document Reviewed: 06/12/2017 Elsevier Patient Education  2020 Elsevier Inc.  

## 2019-03-05 NOTE — Addendum Note (Signed)
Addended by: Silverio Decamp on: 03/05/2019 01:34 PM   Modules accepted: Orders

## 2019-03-05 NOTE — Assessment & Plan Note (Signed)
Alejandra George did not note any changes but she is noting some abnormal symptoms with the increase in her Vyvanse. Considering the drastic increase in her anxiety we are going to hold off on Vyvanse for now. I would like her to seek a second opinion from a psychiatrist of her choice.

## 2019-03-05 NOTE — Progress Notes (Addendum)
    Procedures performed today:    None.  Independent interpretation of tests performed by another provider:   Brain MRI personally reviewed, no abnormal neoplasia or signs of demyelinating disease.  Impression and Recommendations:    Numbness and tingling of right face [name] continues to have intermittent numbness on the right side of the face. This is likely migrainous, we did rule out demyelinating processes and neoplasia with a brain MRI done earlier this year.   Depression with anxiety Alejandra George continues to have significant anxiety symptoms on Viibryd 20 mg daily. She also has some sensations of dissociation, as well as nightmares. She does have some symptoms of PTSD. I would like her to see Dr. Lulu Riding to discuss [place]. I am also going to discontinue her Vyvanse for now as this could worsen her anxiety.  Attention deficit disorder Alejandra George did not note any changes but she is noting some abnormal symptoms with the increase in her Vyvanse. Considering the drastic increase in her anxiety we are going to hold off on Vyvanse for now. I would like her to seek a second opinion from a psychiatrist of her choice.  Anal fissure Alejandra George has a large anal fissure at the 7 to 8 o'clock position. We are to start with topical nitroglycerin applied rectally and stool softeners, she understands she will get a headache from the nitroglycerin. Considering the size of the fissure if she is not better in a week or 2 I would like to see her back and I will likely close it with absorbable sutures myself.    ___________________________________________ Gwen Her. Dianah Field, M.D., ABFM., CAQSM. Primary Care and [place] In[employer] of [employer] [facility]

## 2019-03-05 NOTE — Assessment & Plan Note (Signed)
Alejandra George continues to have significant anxiety symptoms on Viibryd 20 mg daily. She also has some sensations of dissociation, as well as nightmares. She does have some symptoms of PTSD. I would like her to see Dr. Lulu Riding to discuss [place]. I am also going to discontinue her Vyvanse for now as this could worsen her anxiety.

## 2019-03-05 NOTE — Assessment & Plan Note (Addendum)
Alejandra George has a large anal fissure at the 7 to 8 o'clock position. We are to start with topical nitroglycerin applied rectally and stool softeners, she understands she will get a headache from the nitroglycerin. Considering the size of the fissure if she is not better in a week or 2 I would like to see her back and I will likely close it with absorbable sutures myself.

## 2019-03-05 NOTE — Assessment & Plan Note (Signed)
Peggi continues to have intermittent numbness on the right side of the face. This is likely migrainous, we did rule out demyelinating processes and neoplasia with a brain MRI done earlier this year.

## 2019-03-24 ENCOUNTER — Ambulatory Visit
Admission: RE | Admit: 2019-03-24 | Discharge: 2019-03-24 | Disposition: A | Discharge status: Home / Self Care | Payer: 59 | Source: Ambulatory Visit | Attending: Family Medicine | Admitting: Family Medicine

## 2019-03-24 ENCOUNTER — Other Ambulatory Visit: Payer: Self-pay

## 2019-03-24 ENCOUNTER — Other Ambulatory Visit: Payer: Self-pay | Admitting: Family Medicine

## 2019-03-24 DIAGNOSIS — R61 Generalized hyperhidrosis: Secondary | ICD-10-CM

## 2019-04-13 ENCOUNTER — Ambulatory Visit (HOSPITAL_COMMUNITY): Payer: Medicaid Other | Admitting: Psychiatry

## 2019-11-27 ENCOUNTER — Other Ambulatory Visit: Payer: Self-pay

## 2019-11-27 ENCOUNTER — Ambulatory Visit: Payer: 59 | Attending: Internal Medicine

## 2019-11-27 DIAGNOSIS — Z23 Encounter for immunization: Secondary | ICD-10-CM

## 2019-11-27 NOTE — Progress Notes (Signed)
   Covid-19 Vaccination Clinic  Name:  Alejandra George    MRN: [id number] DOB: Nov 06, 1990  11/27/2019  [name] was observed post Covid-19 immunization for 15 minutes without incident. She was provided with Vaccine Information Sheet and instruction to access the V-Safe system.   [name] was instructed to call 911 with any severe reactions post vaccine: Marland Kitchen Difficulty breathing  . Swelling of face and throat  . A fast heartbeat  . A bad rash all over body  . Dizziness and weakness   Immunizations Administered    Name Date Dose VIS Date Route   Pfizer COVID-19 Vaccine 11/27/2019  2:30 PM 0.3 mL 11/03/2019 Intramuscular   Manufacturer: [place]   Lot: [id number]   [place]: [address]-6

## 2020-06-19 ENCOUNTER — Other Ambulatory Visit: Payer: Self-pay | Admitting: Physician Assistant

## 2020-06-19 DIAGNOSIS — R1084 Generalized abdominal pain: Secondary | ICD-10-CM

## 2020-06-19 DIAGNOSIS — K5900 Constipation, unspecified: Secondary | ICD-10-CM

## 2020-06-22 ENCOUNTER — Ambulatory Visit
Admission: RE | Admit: 2020-06-22 | Discharge: 2020-06-22 | Disposition: A | Discharge status: Home / Self Care | Payer: 59 | Source: Ambulatory Visit | Attending: Physician Assistant | Admitting: Physician Assistant

## 2020-06-22 DIAGNOSIS — R1084 Generalized abdominal pain: Secondary | ICD-10-CM

## 2020-06-22 DIAGNOSIS — K5900 Constipation, unspecified: Secondary | ICD-10-CM

## 2020-06-22 MED ORDER — IOPAMIDOL (ISOVUE-300) INJECTION 61%
100.0000 mL | Freq: Once | INTRAVENOUS | Status: AC | PRN
  Administered 2020-06-22: 100 mL via INTRAVENOUS

## 2021-02-07 DIAGNOSIS — F330 Major depressive disorder, recurrent, mild: Secondary | ICD-10-CM | POA: Diagnosis not present

## 2021-03-21 DIAGNOSIS — F330 Major depressive disorder, recurrent, mild: Secondary | ICD-10-CM | POA: Diagnosis not present

## 2021-03-23 DIAGNOSIS — S61011A Laceration without foreign body of right thumb without damage to nail, initial encounter: Secondary | ICD-10-CM | POA: Diagnosis not present

## 2021-04-10 DIAGNOSIS — F330 Major depressive disorder, recurrent, mild: Secondary | ICD-10-CM | POA: Diagnosis not present

## 2021-04-24 DIAGNOSIS — F330 Major depressive disorder, recurrent, mild: Secondary | ICD-10-CM | POA: Diagnosis not present

## 2021-05-08 DIAGNOSIS — F330 Major depressive disorder, recurrent, mild: Secondary | ICD-10-CM | POA: Diagnosis not present

## 2021-05-21 DIAGNOSIS — E611 Iron deficiency: Secondary | ICD-10-CM | POA: Diagnosis not present

## 2021-05-21 DIAGNOSIS — Z111 Encounter for screening for respiratory tuberculosis: Secondary | ICD-10-CM | POA: Diagnosis not present

## 2021-05-22 DIAGNOSIS — F330 Major depressive disorder, recurrent, mild: Secondary | ICD-10-CM | POA: Diagnosis not present

## 2021-06-05 DIAGNOSIS — F330 Major depressive disorder, recurrent, mild: Secondary | ICD-10-CM | POA: Diagnosis not present

## 2021-06-15 DIAGNOSIS — Z3202 Encounter for pregnancy test, result negative: Secondary | ICD-10-CM | POA: Diagnosis not present

## 2021-06-15 DIAGNOSIS — K644 Residual hemorrhoidal skin tags: Secondary | ICD-10-CM | POA: Diagnosis not present

## 2021-06-15 DIAGNOSIS — R109 Unspecified abdominal pain: Secondary | ICD-10-CM | POA: Diagnosis not present

## 2021-06-19 DIAGNOSIS — F330 Major depressive disorder, recurrent, mild: Secondary | ICD-10-CM | POA: Diagnosis not present

## 2021-06-20 DIAGNOSIS — Z124 Encounter for screening for malignant neoplasm of cervix: Secondary | ICD-10-CM | POA: Diagnosis not present

## 2021-06-20 DIAGNOSIS — Z6822 Body mass index (BMI) 22.0-22.9, adult: Secondary | ICD-10-CM | POA: Diagnosis not present

## 2021-06-20 DIAGNOSIS — Z3202 Encounter for pregnancy test, result negative: Secondary | ICD-10-CM | POA: Diagnosis not present

## 2021-06-20 DIAGNOSIS — N760 Acute vaginitis: Secondary | ICD-10-CM | POA: Diagnosis not present

## 2021-06-20 DIAGNOSIS — R350 Frequency of micturition: Secondary | ICD-10-CM | POA: Diagnosis not present

## 2021-06-20 DIAGNOSIS — Z01419 Encounter for gynecological examination (general) (routine) without abnormal findings: Secondary | ICD-10-CM | POA: Diagnosis not present

## 2021-06-20 DIAGNOSIS — Z803 Family history of malignant neoplasm of breast: Secondary | ICD-10-CM | POA: Diagnosis not present

## 2021-06-20 DIAGNOSIS — N898 Other specified noninflammatory disorders of vagina: Secondary | ICD-10-CM | POA: Diagnosis not present

## 2021-06-20 DIAGNOSIS — R87810 Cervical high risk human papillomavirus (HPV) DNA test positive: Secondary | ICD-10-CM | POA: Diagnosis not present

## 2021-06-22 DIAGNOSIS — F900 Attention-deficit hyperactivity disorder, predominantly inattentive type: Secondary | ICD-10-CM | POA: Diagnosis not present

## 2021-06-22 DIAGNOSIS — F3181 Bipolar II disorder: Secondary | ICD-10-CM | POA: Diagnosis not present

## 2021-07-02 DIAGNOSIS — K625 Hemorrhage of anus and rectum: Secondary | ICD-10-CM | POA: Diagnosis not present

## 2021-07-02 DIAGNOSIS — R197 Diarrhea, unspecified: Secondary | ICD-10-CM | POA: Diagnosis not present

## 2021-07-02 DIAGNOSIS — R634 Abnormal weight loss: Secondary | ICD-10-CM | POA: Diagnosis not present

## 2021-07-03 DIAGNOSIS — F330 Major depressive disorder, recurrent, mild: Secondary | ICD-10-CM | POA: Diagnosis not present

## 2021-07-06 DIAGNOSIS — K625 Hemorrhage of anus and rectum: Secondary | ICD-10-CM | POA: Diagnosis not present

## 2021-07-12 DIAGNOSIS — Z6822 Body mass index (BMI) 22.0-22.9, adult: Secondary | ICD-10-CM | POA: Diagnosis not present

## 2021-07-12 DIAGNOSIS — N946 Dysmenorrhea, unspecified: Secondary | ICD-10-CM | POA: Diagnosis not present

## 2021-07-16 DIAGNOSIS — F330 Major depressive disorder, recurrent, mild: Secondary | ICD-10-CM | POA: Diagnosis not present

## 2021-08-02 DIAGNOSIS — F331 Major depressive disorder, recurrent, moderate: Secondary | ICD-10-CM | POA: Diagnosis not present

## 2021-08-13 DIAGNOSIS — F3132 Bipolar disorder, current episode depressed, moderate: Secondary | ICD-10-CM | POA: Diagnosis not present

## 2021-08-13 DIAGNOSIS — F3111 Bipolar disorder, current episode manic without psychotic features, mild: Secondary | ICD-10-CM | POA: Diagnosis not present

## 2021-08-14 DIAGNOSIS — F330 Major depressive disorder, recurrent, mild: Secondary | ICD-10-CM | POA: Diagnosis not present

## 2021-08-17 DIAGNOSIS — F331 Major depressive disorder, recurrent, moderate: Secondary | ICD-10-CM | POA: Diagnosis not present

## 2021-08-24 DIAGNOSIS — F331 Major depressive disorder, recurrent, moderate: Secondary | ICD-10-CM | POA: Diagnosis not present

## 2021-09-05 DIAGNOSIS — F330 Major depressive disorder, recurrent, mild: Secondary | ICD-10-CM | POA: Diagnosis not present

## 2021-09-21 DIAGNOSIS — F331 Major depressive disorder, recurrent, moderate: Secondary | ICD-10-CM | POA: Diagnosis not present

## 2021-09-26 DIAGNOSIS — F330 Major depressive disorder, recurrent, mild: Secondary | ICD-10-CM | POA: Diagnosis not present

## 2021-09-26 DIAGNOSIS — F3131 Bipolar disorder, current episode depressed, mild: Secondary | ICD-10-CM | POA: Diagnosis not present

## 2021-09-26 DIAGNOSIS — F411 Generalized anxiety disorder: Secondary | ICD-10-CM | POA: Diagnosis not present

## 2021-09-26 DIAGNOSIS — F900 Attention-deficit hyperactivity disorder, predominantly inattentive type: Secondary | ICD-10-CM | POA: Diagnosis not present

## 2021-09-26 DIAGNOSIS — F3132 Bipolar disorder, current episode depressed, moderate: Secondary | ICD-10-CM | POA: Diagnosis not present

## 2021-10-03 DIAGNOSIS — R350 Frequency of micturition: Secondary | ICD-10-CM | POA: Diagnosis not present

## 2021-10-05 DIAGNOSIS — K625 Hemorrhage of anus and rectum: Secondary | ICD-10-CM | POA: Diagnosis not present

## 2021-10-05 DIAGNOSIS — K648 Other hemorrhoids: Secondary | ICD-10-CM | POA: Diagnosis not present

## 2021-10-05 DIAGNOSIS — R197 Diarrhea, unspecified: Secondary | ICD-10-CM | POA: Diagnosis not present

## 2021-10-05 DIAGNOSIS — R634 Abnormal weight loss: Secondary | ICD-10-CM | POA: Diagnosis not present

## 2021-10-05 DIAGNOSIS — K633 Ulcer of intestine: Secondary | ICD-10-CM | POA: Diagnosis not present

## 2021-10-05 DIAGNOSIS — K5289 Other specified noninfective gastroenteritis and colitis: Secondary | ICD-10-CM | POA: Diagnosis not present

## 2021-10-16 DIAGNOSIS — R0602 Shortness of breath: Secondary | ICD-10-CM | POA: Diagnosis not present

## 2021-10-16 DIAGNOSIS — M546 Pain in thoracic spine: Secondary | ICD-10-CM | POA: Diagnosis not present

## 2021-10-16 DIAGNOSIS — F419 Anxiety disorder, unspecified: Secondary | ICD-10-CM | POA: Diagnosis not present

## 2021-10-17 DIAGNOSIS — F330 Major depressive disorder, recurrent, mild: Secondary | ICD-10-CM | POA: Diagnosis not present

## 2021-10-22 DIAGNOSIS — F331 Major depressive disorder, recurrent, moderate: Secondary | ICD-10-CM | POA: Diagnosis not present

## 2021-10-31 DIAGNOSIS — F331 Major depressive disorder, recurrent, moderate: Secondary | ICD-10-CM | POA: Diagnosis not present

## 2021-11-01 DIAGNOSIS — Z6821 Body mass index (BMI) 21.0-21.9, adult: Secondary | ICD-10-CM | POA: Diagnosis not present

## 2021-11-01 DIAGNOSIS — Z3202 Encounter for pregnancy test, result negative: Secondary | ICD-10-CM | POA: Diagnosis not present

## 2021-11-01 DIAGNOSIS — Z30430 Encounter for insertion of intrauterine contraceptive device: Secondary | ICD-10-CM | POA: Diagnosis not present

## 2021-11-05 DIAGNOSIS — F331 Major depressive disorder, recurrent, moderate: Secondary | ICD-10-CM | POA: Diagnosis not present

## 2021-11-06 DIAGNOSIS — F330 Major depressive disorder, recurrent, mild: Secondary | ICD-10-CM | POA: Diagnosis not present

## 2021-11-12 DIAGNOSIS — F331 Major depressive disorder, recurrent, moderate: Secondary | ICD-10-CM | POA: Diagnosis not present

## 2021-11-14 DIAGNOSIS — F330 Major depressive disorder, recurrent, mild: Secondary | ICD-10-CM | POA: Diagnosis not present

## 2021-11-19 DIAGNOSIS — F331 Major depressive disorder, recurrent, moderate: Secondary | ICD-10-CM | POA: Diagnosis not present

## 2021-11-26 DIAGNOSIS — F331 Major depressive disorder, recurrent, moderate: Secondary | ICD-10-CM | POA: Diagnosis not present

## 2021-12-11 DIAGNOSIS — F331 Major depressive disorder, recurrent, moderate: Secondary | ICD-10-CM | POA: Diagnosis not present

## 2021-12-12 DIAGNOSIS — F330 Major depressive disorder, recurrent, mild: Secondary | ICD-10-CM | POA: Diagnosis not present

## 2021-12-18 DIAGNOSIS — F331 Major depressive disorder, recurrent, moderate: Secondary | ICD-10-CM | POA: Diagnosis not present

## 2021-12-28 DIAGNOSIS — F331 Major depressive disorder, recurrent, moderate: Secondary | ICD-10-CM | POA: Diagnosis not present

## 2022-01-01 DIAGNOSIS — F330 Major depressive disorder, recurrent, mild: Secondary | ICD-10-CM | POA: Diagnosis not present

## 2022-01-01 DIAGNOSIS — F331 Major depressive disorder, recurrent, moderate: Secondary | ICD-10-CM | POA: Diagnosis not present

## 2022-01-02 DIAGNOSIS — F331 Major depressive disorder, recurrent, moderate: Secondary | ICD-10-CM | POA: Diagnosis not present

## 2022-01-28 DIAGNOSIS — N760 Acute vaginitis: Secondary | ICD-10-CM | POA: Diagnosis not present

## 2022-01-28 DIAGNOSIS — Z30432 Encounter for removal of intrauterine contraceptive device: Secondary | ICD-10-CM | POA: Diagnosis not present

## 2022-01-28 DIAGNOSIS — Z6821 Body mass index (BMI) 21.0-21.9, adult: Secondary | ICD-10-CM | POA: Diagnosis not present

## 2022-01-28 DIAGNOSIS — N898 Other specified noninflammatory disorders of vagina: Secondary | ICD-10-CM | POA: Diagnosis not present

## 2022-01-29 DIAGNOSIS — F331 Major depressive disorder, recurrent, moderate: Secondary | ICD-10-CM | POA: Diagnosis not present

## 2022-01-30 DIAGNOSIS — F330 Major depressive disorder, recurrent, mild: Secondary | ICD-10-CM | POA: Diagnosis not present

## 2022-02-07 DIAGNOSIS — F900 Attention-deficit hyperactivity disorder, predominantly inattentive type: Secondary | ICD-10-CM | POA: Diagnosis not present

## 2022-02-07 DIAGNOSIS — F332 Major depressive disorder, recurrent severe without psychotic features: Secondary | ICD-10-CM | POA: Diagnosis not present

## 2022-02-15 DIAGNOSIS — F330 Major depressive disorder, recurrent, mild: Secondary | ICD-10-CM | POA: Diagnosis not present

## 2022-02-21 DIAGNOSIS — F332 Major depressive disorder, recurrent severe without psychotic features: Secondary | ICD-10-CM | POA: Diagnosis not present

## 2022-02-22 DIAGNOSIS — F331 Major depressive disorder, recurrent, moderate: Secondary | ICD-10-CM | POA: Diagnosis not present

## 2022-02-27 DIAGNOSIS — F330 Major depressive disorder, recurrent, mild: Secondary | ICD-10-CM | POA: Diagnosis not present

## 2022-02-28 DIAGNOSIS — F332 Major depressive disorder, recurrent severe without psychotic features: Secondary | ICD-10-CM | POA: Diagnosis not present

## 2022-03-04 DIAGNOSIS — F331 Major depressive disorder, recurrent, moderate: Secondary | ICD-10-CM | POA: Diagnosis not present

## 2022-03-05 DIAGNOSIS — F332 Major depressive disorder, recurrent severe without psychotic features: Secondary | ICD-10-CM | POA: Diagnosis not present

## 2022-03-07 DIAGNOSIS — F332 Major depressive disorder, recurrent severe without psychotic features: Secondary | ICD-10-CM | POA: Diagnosis not present

## 2022-03-12 DIAGNOSIS — F332 Major depressive disorder, recurrent severe without psychotic features: Secondary | ICD-10-CM | POA: Diagnosis not present

## 2022-03-13 DIAGNOSIS — F330 Major depressive disorder, recurrent, mild: Secondary | ICD-10-CM | POA: Diagnosis not present

## 2022-03-14 DIAGNOSIS — F332 Major depressive disorder, recurrent severe without psychotic features: Secondary | ICD-10-CM | POA: Diagnosis not present

## 2022-03-18 DIAGNOSIS — F331 Major depressive disorder, recurrent, moderate: Secondary | ICD-10-CM | POA: Diagnosis not present

## 2022-03-19 DIAGNOSIS — F332 Major depressive disorder, recurrent severe without psychotic features: Secondary | ICD-10-CM | POA: Diagnosis not present

## 2022-03-20 DIAGNOSIS — F411 Generalized anxiety disorder: Secondary | ICD-10-CM | POA: Diagnosis not present

## 2022-03-20 DIAGNOSIS — F3341 Major depressive disorder, recurrent, in partial remission: Secondary | ICD-10-CM | POA: Diagnosis not present

## 2022-03-20 DIAGNOSIS — F900 Attention-deficit hyperactivity disorder, predominantly inattentive type: Secondary | ICD-10-CM | POA: Diagnosis not present

## 2022-03-21 DIAGNOSIS — F332 Major depressive disorder, recurrent severe without psychotic features: Secondary | ICD-10-CM | POA: Diagnosis not present

## 2022-03-26 DIAGNOSIS — F332 Major depressive disorder, recurrent severe without psychotic features: Secondary | ICD-10-CM | POA: Diagnosis not present

## 2022-03-28 DIAGNOSIS — F332 Major depressive disorder, recurrent severe without psychotic features: Secondary | ICD-10-CM | POA: Diagnosis not present

## 2022-04-02 DIAGNOSIS — F332 Major depressive disorder, recurrent severe without psychotic features: Secondary | ICD-10-CM | POA: Diagnosis not present

## 2022-04-03 DIAGNOSIS — F330 Major depressive disorder, recurrent, mild: Secondary | ICD-10-CM | POA: Diagnosis not present

## 2022-04-04 DIAGNOSIS — F332 Major depressive disorder, recurrent severe without psychotic features: Secondary | ICD-10-CM | POA: Diagnosis not present

## 2022-04-08 DIAGNOSIS — F332 Major depressive disorder, recurrent severe without psychotic features: Secondary | ICD-10-CM | POA: Diagnosis not present

## 2022-04-09 DIAGNOSIS — F331 Major depressive disorder, recurrent, moderate: Secondary | ICD-10-CM | POA: Diagnosis not present

## 2022-04-16 DIAGNOSIS — F330 Major depressive disorder, recurrent, mild: Secondary | ICD-10-CM | POA: Diagnosis not present

## 2022-04-23 DIAGNOSIS — F332 Major depressive disorder, recurrent severe without psychotic features: Secondary | ICD-10-CM | POA: Diagnosis not present

## 2022-04-25 DIAGNOSIS — F331 Major depressive disorder, recurrent, moderate: Secondary | ICD-10-CM | POA: Diagnosis not present

## 2022-04-25 DIAGNOSIS — F332 Major depressive disorder, recurrent severe without psychotic features: Secondary | ICD-10-CM | POA: Diagnosis not present

## 2022-04-30 DIAGNOSIS — F330 Major depressive disorder, recurrent, mild: Secondary | ICD-10-CM | POA: Diagnosis not present

## 2022-04-30 DIAGNOSIS — F331 Major depressive disorder, recurrent, moderate: Secondary | ICD-10-CM | POA: Diagnosis not present

## 2022-04-30 DIAGNOSIS — F332 Major depressive disorder, recurrent severe without psychotic features: Secondary | ICD-10-CM | POA: Diagnosis not present

## 2022-05-02 DIAGNOSIS — F332 Major depressive disorder, recurrent severe without psychotic features: Secondary | ICD-10-CM | POA: Diagnosis not present

## 2022-05-06 DIAGNOSIS — F332 Major depressive disorder, recurrent severe without psychotic features: Secondary | ICD-10-CM | POA: Diagnosis not present

## 2022-05-08 DIAGNOSIS — R202 Paresthesia of skin: Secondary | ICD-10-CM | POA: Diagnosis not present

## 2022-05-09 DIAGNOSIS — F331 Major depressive disorder, recurrent, moderate: Secondary | ICD-10-CM | POA: Diagnosis not present

## 2022-05-13 DIAGNOSIS — F332 Major depressive disorder, recurrent severe without psychotic features: Secondary | ICD-10-CM | POA: Diagnosis not present

## 2022-05-15 DIAGNOSIS — F330 Major depressive disorder, recurrent, mild: Secondary | ICD-10-CM | POA: Diagnosis not present

## 2022-05-21 DIAGNOSIS — F331 Major depressive disorder, recurrent, moderate: Secondary | ICD-10-CM | POA: Diagnosis not present

## 2022-05-22 DIAGNOSIS — F332 Major depressive disorder, recurrent severe without psychotic features: Secondary | ICD-10-CM | POA: Diagnosis not present

## 2022-05-27 DIAGNOSIS — F332 Major depressive disorder, recurrent severe without psychotic features: Secondary | ICD-10-CM | POA: Diagnosis not present

## 2022-05-29 DIAGNOSIS — F3341 Major depressive disorder, recurrent, in partial remission: Secondary | ICD-10-CM | POA: Diagnosis not present

## 2022-05-29 DIAGNOSIS — F411 Generalized anxiety disorder: Secondary | ICD-10-CM | POA: Diagnosis not present

## 2022-05-29 DIAGNOSIS — F900 Attention-deficit hyperactivity disorder, predominantly inattentive type: Secondary | ICD-10-CM | POA: Diagnosis not present

## 2022-06-03 DIAGNOSIS — F331 Major depressive disorder, recurrent, moderate: Secondary | ICD-10-CM | POA: Diagnosis not present

## 2022-06-04 DIAGNOSIS — F332 Major depressive disorder, recurrent severe without psychotic features: Secondary | ICD-10-CM | POA: Diagnosis not present

## 2022-06-05 DIAGNOSIS — F4312 Post-traumatic stress disorder, chronic: Secondary | ICD-10-CM | POA: Diagnosis not present

## 2022-06-11 DIAGNOSIS — F332 Major depressive disorder, recurrent severe without psychotic features: Secondary | ICD-10-CM | POA: Diagnosis not present

## 2022-06-18 DIAGNOSIS — F330 Major depressive disorder, recurrent, mild: Secondary | ICD-10-CM | POA: Diagnosis not present

## 2022-06-20 DIAGNOSIS — F332 Major depressive disorder, recurrent severe without psychotic features: Secondary | ICD-10-CM | POA: Diagnosis not present

## 2022-06-24 DIAGNOSIS — F332 Major depressive disorder, recurrent severe without psychotic features: Secondary | ICD-10-CM | POA: Diagnosis not present

## 2022-06-24 DIAGNOSIS — F331 Major depressive disorder, recurrent, moderate: Secondary | ICD-10-CM | POA: Diagnosis not present

## 2022-06-25 DIAGNOSIS — F4312 Post-traumatic stress disorder, chronic: Secondary | ICD-10-CM | POA: Diagnosis not present

## 2022-07-02 DIAGNOSIS — F330 Major depressive disorder, recurrent, mild: Secondary | ICD-10-CM | POA: Diagnosis not present

## 2022-07-02 DIAGNOSIS — F4312 Post-traumatic stress disorder, chronic: Secondary | ICD-10-CM | POA: Diagnosis not present

## 2022-07-04 DIAGNOSIS — F332 Major depressive disorder, recurrent severe without psychotic features: Secondary | ICD-10-CM | POA: Diagnosis not present

## 2022-07-09 DIAGNOSIS — F4312 Post-traumatic stress disorder, chronic: Secondary | ICD-10-CM | POA: Diagnosis not present

## 2022-07-10 DIAGNOSIS — F332 Major depressive disorder, recurrent severe without psychotic features: Secondary | ICD-10-CM | POA: Diagnosis not present

## 2022-07-12 DIAGNOSIS — F332 Major depressive disorder, recurrent severe without psychotic features: Secondary | ICD-10-CM | POA: Diagnosis not present

## 2022-07-15 DIAGNOSIS — F332 Major depressive disorder, recurrent severe without psychotic features: Secondary | ICD-10-CM | POA: Diagnosis not present

## 2022-07-15 DIAGNOSIS — G43109 Migraine with aura, not intractable, without status migrainosus: Secondary | ICD-10-CM | POA: Diagnosis not present

## 2022-07-16 DIAGNOSIS — F4312 Post-traumatic stress disorder, chronic: Secondary | ICD-10-CM | POA: Diagnosis not present

## 2022-07-22 DIAGNOSIS — F332 Major depressive disorder, recurrent severe without psychotic features: Secondary | ICD-10-CM | POA: Diagnosis not present

## 2022-07-23 DIAGNOSIS — F4312 Post-traumatic stress disorder, chronic: Secondary | ICD-10-CM | POA: Diagnosis not present

## 2022-07-30 DIAGNOSIS — F4312 Post-traumatic stress disorder, chronic: Secondary | ICD-10-CM | POA: Diagnosis not present

## 2022-08-06 DIAGNOSIS — F4312 Post-traumatic stress disorder, chronic: Secondary | ICD-10-CM | POA: Diagnosis not present

## 2022-08-08 DIAGNOSIS — F332 Major depressive disorder, recurrent severe without psychotic features: Secondary | ICD-10-CM | POA: Diagnosis not present
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
## 2022-08-13 DIAGNOSIS — F4312 Post-traumatic stress disorder, chronic: Secondary | ICD-10-CM | POA: Diagnosis not present

## 2022-08-13 DIAGNOSIS — F332 Major depressive disorder, recurrent severe without psychotic features: Secondary | ICD-10-CM | POA: Diagnosis not present

## 2022-08-14 ENCOUNTER — Other Ambulatory Visit: Payer: Self-pay

## 2022-08-14 ENCOUNTER — Ambulatory Visit: Payer: BC Managed Care – PPO | Admitting: Family Medicine

## 2022-08-14 ENCOUNTER — Ambulatory Visit (INDEPENDENT_AMBULATORY_CARE_PROVIDER_SITE_OTHER): Payer: BC Managed Care – PPO

## 2022-08-14 ENCOUNTER — Encounter: Payer: Self-pay | Admitting: Family Medicine

## 2022-08-14 VITALS — BP 122/88 | HR 99 | Ht 66.0 in | Wt 137.0 lb

## 2022-08-14 DIAGNOSIS — M2550 Pain in unspecified joint: Secondary | ICD-10-CM

## 2022-08-14 DIAGNOSIS — M542 Cervicalgia: Secondary | ICD-10-CM

## 2022-08-14 DIAGNOSIS — M9904 Segmental and somatic dysfunction of sacral region: Secondary | ICD-10-CM

## 2022-08-14 DIAGNOSIS — M25511 Pain in right shoulder: Secondary | ICD-10-CM

## 2022-08-14 DIAGNOSIS — M5010 Cervical disc disorder with radiculopathy, unspecified cervical region: Secondary | ICD-10-CM | POA: Diagnosis not present

## 2022-08-14 DIAGNOSIS — G9332 Myalgic encephalomyelitis/chronic fatigue syndrome: Secondary | ICD-10-CM

## 2022-08-14 DIAGNOSIS — M533 Sacrococcygeal disorders, not elsewhere classified: Secondary | ICD-10-CM | POA: Diagnosis not present

## 2022-08-14 LAB — COMPREHENSIVE METABOLIC PANEL
ALT: 16 U/L (ref 0–35)
AST: 16 U/L (ref 0–37)
Albumin: 4.3 g/dL (ref 3.5–5.2)
Alkaline Phosphatase: 48 U/L (ref 39–117)
BUN: 12 mg/dL (ref 6–23)
CO2: 28 mEq/L (ref 19–32)
Calcium: 9.2 mg/dL (ref 8.4–10.5)
Chloride: 104 mEq/L (ref 96–112)
Creatinine, Ser: 0.74 mg/dL (ref 0.40–1.20)
GFR: 107.5 mL/min (ref >60.00)
Glucose, Bld: 85 mg/dL (ref 70–99)
Potassium: 3.7 mEq/L (ref 3.5–5.1)
Sodium: 137 mEq/L (ref 135–145)
Total Bilirubin: 0.5 mg/dL (ref 0.2–1.2)
Total Protein: 7.4 g/dL (ref 6.0–8.3)

## 2022-08-14 LAB — URIC ACID: Uric Acid, Serum: 3.2 mg/dL (ref 2.4–7.0)

## 2022-08-14 LAB — FERRITIN: Ferritin: 20.2 ng/mL (ref 10.0–291.0)

## 2022-08-14 LAB — CBC WITH DIFFERENTIAL/PLATELET
Basophils Absolute: 0.1 10*3/uL (ref 0.0–0.1)
Basophils Relative: 0.9 % (ref 0.0–3.0)
Eosinophils Absolute: 0.2 10*3/uL (ref 0.0–0.7)
Eosinophils Relative: 2.2 % (ref 0.0–5.0)
HCT: 39 % (ref 36.0–46.0)
Hemoglobin: 12.9 g/dL (ref 12.0–15.0)
Lymphocytes Relative: 25.3 % (ref 12.0–46.0)
Lymphs Abs: 1.7 10*3/uL (ref 0.7–4.0)
MCHC: 33.1 g/dL (ref 30.0–36.0)
MCV: 91.4 fl (ref 78.0–100.0)
Monocytes Absolute: 0.5 10*3/uL (ref 0.1–1.0)
Monocytes Relative: 7.3 % (ref 3.0–12.0)
Neutro Abs: 4.4 10*3/uL (ref 1.4–7.7)
Neutrophils Relative %: 64.3 % (ref 43.0–77.0)
Platelets: 290 10*3/uL (ref 150.0–400.0)
RBC: 4.27 Mil/uL (ref 3.87–5.11)
RDW: 13.6 % (ref 11.5–15.5)
WBC: 6.9 10*3/uL (ref 4.0–10.5)

## 2022-08-14 LAB — IBC PANEL
Iron: 62 ug/dL (ref 42–145)
Saturation Ratios: 20.8 % (ref 20.0–50.0)
TIBC: 298.2 ug/dL (ref 250.0–450.0)
Transferrin: 213 mg/dL (ref 212.0–360.0)

## 2022-08-14 LAB — SEDIMENTATION RATE: Sed Rate: 5 mm/hr (ref 0–20)

## 2022-08-14 LAB — TSH: TSH: 0.7 u[IU]/mL (ref 0.35–5.50)

## 2022-08-14 LAB — VITAMIN D 25 HYDROXY (VIT D DEFICIENCY, FRACTURES): VITD: 32.36 ng/mL (ref 30.00–100.00)

## 2022-08-14 LAB — VITAMIN B12: Vitamin B-12: 412 pg/mL (ref 211–911)

## 2022-08-14 LAB — C-REACTIVE PROTEIN: CRP: <1 mg/dL (ref 0.5–20.0)

## 2022-08-14 NOTE — Assessment & Plan Note (Signed)
   Decision today to treat with OMT was based on Physical Exam  After verbal consent patient was treated with HVLA, ME, FPR techniques in sacral areas,   Patient tolerated the procedure well with improvement in symptoms  Patient given exercises, stretches and lifestyle modifications  See medications in patient instructions if given  Patient will follow up in 4-8 weeks

## 2022-08-14 NOTE — Patient Instructions (Signed)
All labs Neck xray MRI neck Ask about going up to Cymbalta 30mg  See if there is association with menstruation Check back in 5-6 weeks

## 2022-08-14 NOTE — Assessment & Plan Note (Signed)
Patient is having cervical radiculopathy, previous imaging was ago did show that there was a possibility protruding disc.  Concerned that this is causing some exacerbation.  At this point I do feel advanced imaging is warranted secondary to the radicular symptoms that are affecting daily activities.  Patient is going to increase activity only as tolerated until the imaging.  Warned if any worsening symptoms to seek medical attention immediately.

## 2022-08-14 NOTE — Assessment & Plan Note (Signed)
Sacroiliac pain noted.  Attempted osteopathic manipulation with some improvement noted as well.  Discussed icing regimen and home exercises.  Increase activity slowly.  Follow-up with me again in 6 to 8 weeks otherwise.  Discussed hip abductor strengthening exercises.  Patient is on Cymbalta and encouraged her to consider the possibility of increasing to 30 mg.  Do believe underlying fibromyalgia could be playing a role as well.

## 2022-08-23 ENCOUNTER — Encounter: Payer: Self-pay | Admitting: Family Medicine

## 2022-08-27 ENCOUNTER — Other Ambulatory Visit: Payer: BC Managed Care – PPO

## 2022-09-02 DIAGNOSIS — Z6822 Body mass index (BMI) 22.0-22.9, adult: Secondary | ICD-10-CM | POA: Diagnosis not present

## 2022-09-02 DIAGNOSIS — R87810 Cervical high risk human papillomavirus (HPV) DNA test positive: Secondary | ICD-10-CM | POA: Diagnosis not present

## 2022-09-02 DIAGNOSIS — Z124 Encounter for screening for malignant neoplasm of cervix: Secondary | ICD-10-CM | POA: Diagnosis not present

## 2022-09-02 DIAGNOSIS — Z01419 Encounter for gynecological examination (general) (routine) without abnormal findings: Secondary | ICD-10-CM | POA: Diagnosis not present

## 2022-09-02 DIAGNOSIS — N760 Acute vaginitis: Secondary | ICD-10-CM | POA: Diagnosis not present

## 2022-09-02 DIAGNOSIS — Z113 Encounter for screening for infections with a predominantly sexual mode of transmission: Secondary | ICD-10-CM | POA: Diagnosis not present

## 2022-09-04 DIAGNOSIS — F4312 Post-traumatic stress disorder, chronic: Secondary | ICD-10-CM | POA: Diagnosis not present

## 2022-09-11 DIAGNOSIS — F4312 Post-traumatic stress disorder, chronic: Secondary | ICD-10-CM | POA: Diagnosis not present

## 2022-09-17 DIAGNOSIS — F332 Major depressive disorder, recurrent severe without psychotic features: Secondary | ICD-10-CM | POA: Diagnosis not present

## 2022-09-18 DIAGNOSIS — F4312 Post-traumatic stress disorder, chronic: Secondary | ICD-10-CM | POA: Diagnosis not present

## 2022-09-20 ENCOUNTER — Other Ambulatory Visit: Payer: Self-pay | Admitting: Medical Genetics

## 2022-09-20 DIAGNOSIS — F332 Major depressive disorder, recurrent severe without psychotic features: Secondary | ICD-10-CM | POA: Diagnosis not present

## 2022-09-20 DIAGNOSIS — Z006 Encounter for examination for normal comparison and control in clinical research program: Secondary | ICD-10-CM

## 2022-09-24 NOTE — Progress Notes (Deleted)
  [facility] [address] [place] [address] Phone: [phone] Subjective:    I'm seeing this patient by the request  of:  Monica Becton, MD  CC:   [id number]  Alejandra George is a 32 y.o. female coming in with complaint of back and neck pain. OMT 08/14/2022. Patient states   Medications patient has been prescribed: None  Taking:         Reviewed prior external information including notes and imaging from previsou exam, outside providers and external EMR if available.   As well as notes that were available from care everywhere and other healthcare systems.  Past medical history, social, surgical and family history all reviewed in electronic medical record.  No pertanent information unless stated regarding to the chief complaint.   Past Medical History:  Diagnosis Date   Anxiety    Asthma    Depression    History of chicken pox    as a child   History of polydrug abuse (HCC)    Vaginal Pap smear, abnormal     Allergies  Allergen Reactions   Penicillins Swelling   Cephalosporins Rash     Review of Systems:  No headache, visual changes, nausea, vomiting, diarrhea, constipation, dizziness, abdominal pain, skin rash, fevers, chills, night sweats, weight loss, swollen lymph nodes, body aches, joint swelling, chest pain, shortness of breath, mood changes. POSITIVE muscle aches  Objective  There were no vitals taken for this visit.   General: No apparent distress alert and oriented x3 mood and affect normal, dressed appropriately.  HEENT: Pupils equal, extraocular movements intact  Respiratory: Patient's speak in full sentences and does not appear short of breath  Cardiovascular: No lower extremity edema, non tender, no erythema  Gait MSK:  Back   Osteopathic findings  C2 flexed rotated and side bent right C6 flexed rotated and side bent left T3 extended rotated and side bent right inhaled rib T9 extended rotated  and side bent left L2 flexed rotated and side bent right Sacrum right on right       Assessment and Plan:  No problem-specific Assessment & Plan notes found for this encounter.    Nonallopathic problems  Decision today to treat with OMT was based on Physical Exam  After verbal consent patient was treated with HVLA, ME, FPR techniques in cervical, rib, thoracic, lumbar, and sacral  areas  Patient tolerated the procedure well with improvement in symptoms  Patient given exercises, stretches and lifestyle modifications  See medications in patient instructions if given  Patient will follow up in 4-8 weeks             Note: This dictation was prepared with Dragon dictation along with smaller phrase technology. Any transcriptional errors that result from this process are unintentional.

## 2022-09-25 ENCOUNTER — Ambulatory Visit: Payer: BC Managed Care – PPO | Admitting: Family Medicine

## 2022-09-25 DIAGNOSIS — F4312 Post-traumatic stress disorder, chronic: Secondary | ICD-10-CM | POA: Diagnosis not present

## 2022-10-01 ENCOUNTER — Encounter: Payer: Self-pay | Admitting: Family Medicine

## 2022-10-02 DIAGNOSIS — F4312 Post-traumatic stress disorder, chronic: Secondary | ICD-10-CM | POA: Diagnosis not present

## 2022-10-03 ENCOUNTER — Ambulatory Visit
Admission: RE | Admit: 2022-10-03 | Discharge: 2022-10-03 | Disposition: A | Discharge status: Home / Self Care | Payer: BC Managed Care – PPO | Source: Ambulatory Visit | Attending: Family Medicine | Admitting: Family Medicine

## 2022-10-03 DIAGNOSIS — M542 Cervicalgia: Secondary | ICD-10-CM

## 2022-10-03 DIAGNOSIS — M50223 Other cervical disc displacement at C6-C7 level: Secondary | ICD-10-CM | POA: Diagnosis not present

## 2022-10-08 DIAGNOSIS — F332 Major depressive disorder, recurrent severe without psychotic features: Secondary | ICD-10-CM | POA: Diagnosis not present

## 2022-10-15 DIAGNOSIS — J4520 Mild intermittent asthma, uncomplicated: Secondary | ICD-10-CM | POA: Diagnosis not present

## 2022-10-16 DIAGNOSIS — F4312 Post-traumatic stress disorder, chronic: Secondary | ICD-10-CM | POA: Diagnosis not present

## 2022-10-18 NOTE — Progress Notes (Unsigned)
  [facility] [address] [place] [address] Phone: [phone] Subjective:   Bruce Donath, am serving as a scribe for Dr. Antoine Primas.  I'm seeing this patient by the request  of:  Monica Becton, MD  CC: Back and neck pain follow-up  [id number]  Alaysiah Browder is a 32 y.o. female coming in with complaint of back and neck pain. OMT on 08/14/2022. Patient states that she is the same as last visit. Believes that she had an SI joint flare on the R side for one week. Pain has subsided.   Medications patient has been prescribed:   Taking:         Reviewed prior external information including notes and imaging from previsou exam, outside providers and external EMR if available.   As well as notes that were available from care everywhere and other healthcare systems.  Past medical history, social, surgical and family history all reviewed in electronic medical record.  No pertanent information unless stated regarding to the chief complaint.   Past Medical History:  Diagnosis Date   Anxiety    Asthma    Depression    History of chicken pox    as a child   History of polydrug abuse (HCC)    Vaginal Pap smear, abnormal     Allergies  Allergen Reactions   Penicillins Swelling   Cephalosporins Rash     Review of Systems:  No headache, visual changes, nausea, vomiting, diarrhea, constipation, dizziness, abdominal pain, skin rash, fevers, chills, night sweats, weight loss, swollen lymph nodes, body aches, joint swelling, chest pain, shortness of breath, mood changes. POSITIVE muscle aches  Objective  There were no vitals taken for this visit.   General: No apparent distress alert and oriented x3 mood and affect normal, dressed appropriately.  HEENT: Pupils equal, extraocular movements intact  Respiratory: Patient's speak in full sentences and does not appear short of breath  Cardiovascular: No lower extremity edema, non  tender, no erythema  Gait MSK:  Back   Osteopathic findings  C2 flexed rotated and side bent right C6 flexed rotated and side bent left T3 extended rotated and side bent right inhaled rib T9 extended rotated and side bent left L2 flexed rotated and side bent right Sacrum right on right       Assessment and Plan:  No problem-specific Assessment & Plan notes found for this encounter.    Nonallopathic problems  Decision today to treat with OMT was based on Physical Exam  After verbal consent patient was treated with HVLA, ME, FPR techniques in cervical, rib, thoracic, lumbar, and sacral  areas  Patient tolerated the procedure well with improvement in symptoms  Patient given exercises, stretches and lifestyle modifications  See medications in patient instructions if given  Patient will follow up in 4-8 weeks    The above documentation has been reviewed and is accurate and complete Judi Saa, DO          Note: This dictation was prepared with Dragon dictation along with smaller phrase technology. Any transcriptional errors that result from this process are unintentional.

## 2022-10-21 ENCOUNTER — Ambulatory Visit: Payer: BC Managed Care – PPO | Admitting: Family Medicine

## 2022-10-21 VITALS — BP 122/88 | HR 108 | Ht 66.0 in | Wt 144.0 lb

## 2022-10-21 DIAGNOSIS — M9902 Segmental and somatic dysfunction of thoracic region: Secondary | ICD-10-CM | POA: Diagnosis not present

## 2022-10-21 DIAGNOSIS — M9904 Segmental and somatic dysfunction of sacral region: Secondary | ICD-10-CM | POA: Diagnosis not present

## 2022-10-21 DIAGNOSIS — M9903 Segmental and somatic dysfunction of lumbar region: Secondary | ICD-10-CM

## 2022-10-21 DIAGNOSIS — M9908 Segmental and somatic dysfunction of rib cage: Secondary | ICD-10-CM | POA: Diagnosis not present

## 2022-10-21 DIAGNOSIS — M5010 Cervical disc disorder with radiculopathy, unspecified cervical region: Secondary | ICD-10-CM

## 2022-10-21 DIAGNOSIS — M9901 Segmental and somatic dysfunction of cervical region: Secondary | ICD-10-CM | POA: Diagnosis not present

## 2022-10-21 DIAGNOSIS — M2550 Pain in unspecified joint: Secondary | ICD-10-CM

## 2022-10-21 LAB — CBC WITH DIFFERENTIAL/PLATELET
Basophils Absolute: 0.1 10*3/uL (ref 0.0–0.1)
Basophils Relative: 1.1 % (ref 0.0–3.0)
Eosinophils Absolute: 0.2 10*3/uL (ref 0.0–0.7)
Eosinophils Relative: 2.9 % (ref 0.0–5.0)
HCT: 36.1 % (ref 36.0–46.0)
Hemoglobin: 12 g/dL (ref 12.0–15.0)
Lymphocytes Relative: 30.6 % (ref 12.0–46.0)
Lymphs Abs: 1.8 10*3/uL (ref 0.7–4.0)
MCHC: 33.2 g/dL (ref 30.0–36.0)
MCV: 91.6 fL (ref 78.0–100.0)
Monocytes Absolute: 0.4 10*3/uL (ref 0.1–1.0)
Monocytes Relative: 6.7 % (ref 3.0–12.0)
Neutro Abs: 3.5 10*3/uL (ref 1.4–7.7)
Neutrophils Relative %: 58.7 % (ref 43.0–77.0)
Platelets: 305 10*3/uL (ref 150.0–400.0)
RBC: 3.94 Mil/uL (ref 3.87–5.11)
RDW: 13.3 % (ref 11.5–15.5)
WBC: 6 10*3/uL (ref 4.0–10.5)

## 2022-10-21 LAB — SEDIMENTATION RATE: Sed Rate: 9 mm/h (ref 0–20)

## 2022-10-21 NOTE — Patient Instructions (Signed)
Labs today Will keep improving Neck should be stable for decades Worsening pain can consider epidural See me again in 6-8 weeks

## 2022-10-22 ENCOUNTER — Encounter: Payer: Self-pay | Admitting: Family Medicine

## 2022-10-22 LAB — COMPREHENSIVE METABOLIC PANEL
ALT: 11 U/L (ref 0–35)
AST: 17 U/L (ref 0–37)
Albumin: 3.8 g/dL (ref 3.5–5.2)
Alkaline Phosphatase: 45 U/L (ref 39–117)
BUN: 9 mg/dL (ref 6–23)
CO2: 28 meq/L (ref 19–32)
Calcium: 9.1 mg/dL (ref 8.4–10.5)
Chloride: 104 meq/L (ref 96–112)
Creatinine, Ser: 0.73 mg/dL (ref 0.40–1.20)
GFR: 109.12 mL/min (ref >60.00)
Glucose, Bld: 89 mg/dL (ref 70–99)
Potassium: 3.9 meq/L (ref 3.5–5.1)
Sodium: 137 meq/L (ref 135–145)
Total Bilirubin: 0.3 mg/dL (ref 0.2–1.2)
Total Protein: 6.8 g/dL (ref 6.0–8.3)

## 2022-10-22 NOTE — Assessment & Plan Note (Signed)
Chronic, stable MRI mild protruding disc. Discussed HEP  Right si Joint we will continue to monitor. Respond well to OMT.  RTC in 6-8 weeks

## 2022-10-23 DIAGNOSIS — F4312 Post-traumatic stress disorder, chronic: Secondary | ICD-10-CM | POA: Diagnosis not present

## 2022-10-30 DIAGNOSIS — D225 Melanocytic nevi of trunk: Secondary | ICD-10-CM | POA: Diagnosis not present

## 2022-10-30 DIAGNOSIS — L821 Other seborrheic keratosis: Secondary | ICD-10-CM | POA: Diagnosis not present

## 2022-10-30 DIAGNOSIS — F4312 Post-traumatic stress disorder, chronic: Secondary | ICD-10-CM | POA: Diagnosis not present

## 2022-10-30 DIAGNOSIS — L814 Other melanin hyperpigmentation: Secondary | ICD-10-CM | POA: Diagnosis not present

## 2022-10-30 DIAGNOSIS — L720 Epidermal cyst: Secondary | ICD-10-CM | POA: Diagnosis not present

## 2022-11-06 ENCOUNTER — Encounter: Payer: Self-pay | Admitting: Family Medicine

## 2022-11-06 DIAGNOSIS — F4312 Post-traumatic stress disorder, chronic: Secondary | ICD-10-CM | POA: Diagnosis not present

## 2022-11-08 DIAGNOSIS — F332 Major depressive disorder, recurrent severe without psychotic features: Secondary | ICD-10-CM | POA: Diagnosis not present

## 2022-11-11 DIAGNOSIS — F332 Major depressive disorder, recurrent severe without psychotic features: Secondary | ICD-10-CM | POA: Diagnosis not present

## 2022-11-13 DIAGNOSIS — F4312 Post-traumatic stress disorder, chronic: Secondary | ICD-10-CM | POA: Diagnosis not present

## 2022-11-13 DIAGNOSIS — F332 Major depressive disorder, recurrent severe without psychotic features: Secondary | ICD-10-CM | POA: Diagnosis not present

## 2022-11-15 DIAGNOSIS — R519 Headache, unspecified: Secondary | ICD-10-CM | POA: Diagnosis not present

## 2022-11-15 DIAGNOSIS — J320 Chronic maxillary sinusitis: Secondary | ICD-10-CM | POA: Diagnosis not present

## 2022-11-15 DIAGNOSIS — J309 Allergic rhinitis, unspecified: Secondary | ICD-10-CM | POA: Diagnosis not present

## 2022-11-20 DIAGNOSIS — F4312 Post-traumatic stress disorder, chronic: Secondary | ICD-10-CM | POA: Diagnosis not present

## 2022-11-25 DIAGNOSIS — F332 Major depressive disorder, recurrent severe without psychotic features: Secondary | ICD-10-CM | POA: Diagnosis not present

## 2022-11-27 DIAGNOSIS — F4312 Post-traumatic stress disorder, chronic: Secondary | ICD-10-CM | POA: Diagnosis not present

## 2022-11-27 NOTE — Progress Notes (Deleted)
  [facility] [address] [place] [address] Phone: [phone] Subjective:    I'm seeing this patient by the request  of:  Monica Becton, MD  CC:   [id number]  Alejandra George is a 32 y.o. female coming in with complaint of back and neck pain. OMT on 10/21/2022. Patient states   Medications patient has been prescribed:   Taking:         Reviewed prior external information including notes and imaging from previsou exam, outside providers and external EMR if available.   As well as notes that were available from care everywhere and other healthcare systems.  Past medical history, social, surgical and family history all reviewed in electronic medical record.  No pertanent information unless stated regarding to the chief complaint.   Past Medical History:  Diagnosis Date   Anxiety    Asthma    Depression    History of chicken pox    as a child   History of polydrug abuse (HCC)    Vaginal Pap smear, abnormal     Allergies  Allergen Reactions   Penicillins Swelling   Cephalosporins Rash     Review of Systems:  No headache, visual changes, nausea, vomiting, diarrhea, constipation, dizziness, abdominal pain, skin rash, fevers, chills, night sweats, weight loss, swollen lymph nodes, body aches, joint swelling, chest pain, shortness of breath, mood changes. POSITIVE muscle aches  Objective  There were no vitals taken for this visit.   General: No apparent distress alert and oriented x3 mood and affect normal, dressed appropriately.  HEENT: Pupils equal, extraocular movements intact  Respiratory: Patient's speak in full sentences and does not appear short of breath  Cardiovascular: No lower extremity edema, non tender, no erythema  Gait MSK:  Back   Osteopathic findings  C2 flexed rotated and side bent right C6 flexed rotated and side bent left T3 extended rotated and side bent right inhaled rib T9 extended rotated  and side bent left L2 flexed rotated and side bent right Sacrum right on right       Assessment and Plan:  No problem-specific Assessment & Plan notes found for this encounter.    Nonallopathic problems  Decision today to treat with OMT was based on Physical Exam  After verbal consent patient was treated with HVLA, ME, FPR techniques in cervical, rib, thoracic, lumbar, and sacral  areas  Patient tolerated the procedure well with improvement in symptoms  Patient given exercises, stretches and lifestyle modifications  See medications in patient instructions if given  Patient will follow up in 4-8 weeks             Note: This dictation was prepared with Dragon dictation along with smaller phrase technology. Any transcriptional errors that result from this process are unintentional.

## 2022-12-02 ENCOUNTER — Ambulatory Visit: Payer: BC Managed Care – PPO | Admitting: Family Medicine

## 2022-12-03 DIAGNOSIS — F4312 Post-traumatic stress disorder, chronic: Secondary | ICD-10-CM | POA: Diagnosis not present

## 2022-12-04 DIAGNOSIS — F3341 Major depressive disorder, recurrent, in partial remission: Secondary | ICD-10-CM | POA: Diagnosis not present

## 2022-12-04 DIAGNOSIS — F411 Generalized anxiety disorder: Secondary | ICD-10-CM | POA: Diagnosis not present

## 2022-12-04 DIAGNOSIS — F900 Attention-deficit hyperactivity disorder, predominantly inattentive type: Secondary | ICD-10-CM | POA: Diagnosis not present

## 2022-12-05 DIAGNOSIS — F332 Major depressive disorder, recurrent severe without psychotic features: Secondary | ICD-10-CM | POA: Diagnosis not present

## 2022-12-09 ENCOUNTER — Encounter (HOSPITAL_BASED_OUTPATIENT_CLINIC_OR_DEPARTMENT_OTHER): Payer: Self-pay | Admitting: Obstetrics and Gynecology

## 2022-12-09 ENCOUNTER — Other Ambulatory Visit: Payer: Self-pay

## 2022-12-09 NOTE — Progress Notes (Signed)
Spoke w/ via phone for pre-op interview: patient  Lab needs dos: UPT Lab results: NA COVID test: patient states asymptomatic no test needed. Arrive at 1145 AM 12/20/22 NPO after MN except clear liquids. Clear liquids from MN until 1045 AM Med rec completed. Medications to take morning of surgery: inhaler and auvelity Diabetic medication: NA Patient instructed no nail polish to be worn day of surgery. Patient instructed to bring photo id and insurance card day of surgery. Patient aware to have driver (ride ) / caregiver for 24 hours after surgery. Friend, Sophie to drive. Special Instructions: NA Patient verbalized understanding of instructions that were given at this phone interview. Patient denies shortness of breath, chest pain, fever, cough at this phone interview.

## 2022-12-11 DIAGNOSIS — F332 Major depressive disorder, recurrent severe without psychotic features: Secondary | ICD-10-CM | POA: Diagnosis not present

## 2022-12-14 ENCOUNTER — Other Ambulatory Visit: Payer: Self-pay | Admitting: Obstetrics and Gynecology

## 2022-12-14 DIAGNOSIS — Z01818 Encounter for other preprocedural examination: Secondary | ICD-10-CM

## 2022-12-17 NOTE — H&P (Signed)
32 y.o. G1P1001 desires permanent sterilization.  Pt does not like other forms of BC and desires never to be pregnant again.  Past Medical History:  Diagnosis Date   Anxiety    Asthma    Bipolar disorder (HCC)    Depression    History of chicken pox    as a child   History of polydrug abuse (HCC)    Vaginal Pap smear, abnormal    Past Surgical History:  Procedure Laterality Date   BREAST ENHANCEMENT SURGERY     CESAREAN SECTION  07/24/2011   Procedure: CESAREAN SECTION;  Surgeon: Mickel Baas, MD;  Location: [facility];  Service: Gynecology;  Laterality: N/A;   TONSILLECTOMY      Social History   Socioeconomic History   Marital status: Single    Spouse name: Not on file   Number of children: Not on file   Years of education: Not on file   Highest education level: Not on file  Occupational History   Not on file  Tobacco Use   Smoking status: Never   Smokeless tobacco: Never  Vaping Use   Vaping status: Never Used  Substance and Sexual Activity   Alcohol use: No   Drug use: Not Currently    Types: Amphetamines, Heroin, "Crack" cocaine    Comment: heroine (pt states stopped recreational drugs 2014)   Sexual activity: Yes  Other Topics Concern   Not on file  Social History Narrative   Not on file   Social Determinants of Health   Financial Resource Strain: Not on file  Food Insecurity: No Food Insecurity (03/15/2020)   Received from [facility], [facility]   Hunger Vital Sign    Worried About Running Out of Food in the Last Year: Never true    Ran Out of Food in the Last Year: Never true  Transportation Needs: Not on file  Physical Activity: Not on file  Stress: Not on file  Social Connections: Unknown (05/19/2021)   Received from [facility], [facility]   Social Network    Social Network: Not on file  Intimate Partner Violence: Unknown (04/17/2021)   Received from [facility], [facility]   HITS    Physically Hurt: Not on file    Insult or Talk  Down To: Not on file    Threaten Physical Harm: Not on file    Scream or Curse: Not on file    No current facility-administered medications on file prior to encounter.   Current Outpatient Medications on File Prior to Encounter  Medication Sig Dispense Refill   Amphetamine-Dextroamphetamine (ADDERALL XR PO) Take 30 mg by mouth daily.     CLONIDINE HCL PO Take 0.3 mg by mouth at bedtime.     Dextromethorphan-Bupropion (AUVELITY PO) Take 20 mg by mouth daily.     Esketamine HCl (SPRAVATO, 84 MG DOSE, NA) Place into the nose. Twice a month     lamoTRIgine (LAMICTAL PO) Take 75 mg by mouth at bedtime.     LORazepam (ATIVAN) 0.5 MG tablet Take 0.5 mg by mouth every 8 (eight) hours as needed for anxiety.     propranolol (INDERAL) 20 MG tablet Take 20 mg by mouth 3 (three) times daily as needed (anxiety).     sertraline (ZOLOFT) 25 MG tablet Take 25 mg by mouth at bedtime.     UNKNOWN TO PATIENT daily. "Steroid inhaler" - pt does not know name but will check and update DOS     albuterol (VENTOLIN  HFA) 108 (90 Base) MCG/ACT inhaler Inhale 2 puffs into the lungs every 6 (six) hours as needed for wheezing. 18 g 3   docusate sodium (COLACE) 100 MG capsule Take 1 capsule (100 mg total) by mouth 3 (three) times daily as needed. 90 capsule 3   Nitroglycerin 0.4 % OINT Place 1 inch rectally every 12 (twelve) hours as needed (for pain, use up to 3 weeks.). 30 g 3   Vilazodone HCl (VIIBRYD) 20 MG TABS Take 1 tablet (20 mg total) by mouth daily. 30 tablet 3    Allergies  Allergen Reactions   Penicillins Swelling   Sulfa Antibiotics Hives   Cephalosporins Rash    Vitals:   12/09/22 1112  Weight: 62.1 kg  Height: 5\' 6"  (1.676 m)    Lungs: clear to ascultation Cor:  RRR Abdomen:  soft, nontender, nondistended. Ex:  no cords, erythema Pelvic:  nEFG, normal size uterus  A:  For bilateral salpingectomies for sterilization.    P: P: All risks, benefits and alternatives d/w patient and she  desires to proceed.  Patient has undergone an ERAS protocol and will receive SCDs during the operation.   Loney Laurence

## 2022-12-18 DIAGNOSIS — F332 Major depressive disorder, recurrent severe without psychotic features: Secondary | ICD-10-CM | POA: Diagnosis not present

## 2022-12-20 ENCOUNTER — Encounter (HOSPITAL_BASED_OUTPATIENT_CLINIC_OR_DEPARTMENT_OTHER)
Admission: RE | Disposition: A | Discharge status: Home / Self Care | Payer: Self-pay | Source: Home / Self Care | Attending: Obstetrics and Gynecology

## 2022-12-20 ENCOUNTER — Ambulatory Visit (HOSPITAL_BASED_OUTPATIENT_CLINIC_OR_DEPARTMENT_OTHER): Payer: BC Managed Care – PPO | Admitting: Certified Registered"

## 2022-12-20 ENCOUNTER — Ambulatory Visit (HOSPITAL_BASED_OUTPATIENT_CLINIC_OR_DEPARTMENT_OTHER)
Admission: RE | Admit: 2022-12-20 | Discharge: 2022-12-20 | Disposition: A | Discharge status: Home / Self Care | Payer: BC Managed Care – PPO | Attending: Obstetrics and Gynecology | Admitting: Obstetrics and Gynecology

## 2022-12-20 ENCOUNTER — Other Ambulatory Visit: Payer: Self-pay

## 2022-12-20 ENCOUNTER — Encounter (HOSPITAL_BASED_OUTPATIENT_CLINIC_OR_DEPARTMENT_OTHER): Payer: Self-pay | Admitting: Obstetrics and Gynecology

## 2022-12-20 DIAGNOSIS — J45909 Unspecified asthma, uncomplicated: Secondary | ICD-10-CM | POA: Insufficient documentation

## 2022-12-20 DIAGNOSIS — Z01818 Encounter for other preprocedural examination: Secondary | ICD-10-CM

## 2022-12-20 DIAGNOSIS — Z79899 Other long term (current) drug therapy: Secondary | ICD-10-CM | POA: Diagnosis not present

## 2022-12-20 DIAGNOSIS — Z302 Encounter for sterilization: Secondary | ICD-10-CM | POA: Insufficient documentation

## 2022-12-20 DIAGNOSIS — M9904 Segmental and somatic dysfunction of sacral region: Secondary | ICD-10-CM

## 2022-12-20 HISTORY — DX: Bipolar disorder, unspecified: F31.9

## 2022-12-20 HISTORY — PX: LAPAROSCOPIC BILATERAL SALPINGECTOMY: SHX5889

## 2022-12-20 LAB — CBC
HCT: 43.7 % (ref 36.0–46.0)
Hemoglobin: 14.7 g/dL (ref 12.0–15.0)
MCH: 30.4 pg (ref 26.0–34.0)
MCHC: 33.6 g/dL (ref 30.0–36.0)
MCV: 90.5 fL (ref 80.0–100.0)
Platelets: 312 10*3/uL (ref 150–400)
RBC: 4.83 MIL/uL (ref 3.87–5.11)
RDW: 12.4 % (ref 11.5–15.5)
WBC: 5.3 10*3/uL (ref 4.0–10.5)
nRBC: 0 % (ref 0.0–0.2)

## 2022-12-20 LAB — TYPE AND SCREEN
ABO/RH(D): O POS
Antibody Screen: NEGATIVE

## 2022-12-20 LAB — POCT PREGNANCY, URINE: Preg Test, Ur: NEGATIVE

## 2022-12-20 SURGERY — SALPINGECTOMY, BILATERAL, LAPAROSCOPIC
Anesthesia: General | Laterality: Bilateral

## 2022-12-20 MED ORDER — SOD CITRATE-CITRIC ACID 500-334 MG/5ML PO SOLN: 30.0000 mL | ORAL | Status: DC

## 2022-12-20 MED ORDER — OXYCODONE HCL 5 MG PO TABS
5.0000 mg | ORAL_TABLET | Freq: Once | ORAL | Status: AC
  Administered 2022-12-20: 5 mg via ORAL

## 2022-12-20 MED ORDER — FENTANYL CITRATE (PF) 100 MCG/2ML IJ SOLN
INTRAMUSCULAR | Status: AC
  Filled 2022-12-20: qty 2

## 2022-12-20 MED ORDER — LIDOCAINE 2% (20 MG/ML) 5 ML SYRINGE
INTRAMUSCULAR | Status: DC | PRN
  Administered 2022-12-20: 60 mg via INTRAVENOUS

## 2022-12-20 MED ORDER — ACETAMINOPHEN 500 MG PO TABS
ORAL_TABLET | ORAL | Status: AC
  Filled 2022-12-20: qty 2

## 2022-12-20 MED ORDER — PROPOFOL 10 MG/ML IV BOLUS
INTRAVENOUS | Status: DC | PRN
  Administered 2022-12-20: 150 mg via INTRAVENOUS

## 2022-12-20 MED ORDER — KETOROLAC TROMETHAMINE 30 MG/ML IJ SOLN
INTRAMUSCULAR | Status: DC | PRN
  Administered 2022-12-20: 30 mg via INTRAVENOUS

## 2022-12-20 MED ORDER — SODIUM CHLORIDE 0.9 % IV SOLN: INTRAVENOUS | Status: DC

## 2022-12-20 MED ORDER — SCOPOLAMINE 1 MG/3DAYS TD PT72
1.0000 | MEDICATED_PATCH | TRANSDERMAL | Status: DC
  Administered 2022-12-20: 1.5 mg via TRANSDERMAL

## 2022-12-20 MED ORDER — PHENYLEPHRINE 80 MCG/ML (10ML) SYRINGE FOR IV PUSH (FOR BLOOD PRESSURE SUPPORT)
PREFILLED_SYRINGE | INTRAVENOUS | Status: DC | PRN
  Administered 2022-12-20: 80 ug via INTRAVENOUS

## 2022-12-20 MED ORDER — OXYCODONE HCL 5 MG PO TABS
ORAL_TABLET | ORAL | Status: AC
  Filled 2022-12-20: qty 1

## 2022-12-20 MED ORDER — POVIDONE-IODINE 10 % EX SWAB: 2.0000 | Freq: Once | CUTANEOUS | Status: DC

## 2022-12-20 MED ORDER — ROCURONIUM BROMIDE 10 MG/ML (PF) SYRINGE
PREFILLED_SYRINGE | INTRAVENOUS | Status: DC | PRN
  Administered 2022-12-20: 40 mg via INTRAVENOUS

## 2022-12-20 MED ORDER — ONDANSETRON HCL 4 MG/2ML IJ SOLN
INTRAMUSCULAR | Status: AC
  Filled 2022-12-20: qty 2

## 2022-12-20 MED ORDER — ROPIVACAINE HCL 5 MG/ML IJ SOLN
INTRAMUSCULAR | Status: DC | PRN
  Administered 2022-12-20: 30 mL

## 2022-12-20 MED ORDER — LIDOCAINE HCL (PF) 2 % IJ SOLN
INTRAMUSCULAR | Status: AC
  Filled 2022-12-20: qty 5

## 2022-12-20 MED ORDER — ACETAMINOPHEN 500 MG PO TABS
1000.0000 mg | ORAL_TABLET | Freq: Once | ORAL | Status: AC
  Administered 2022-12-20: 1000 mg via ORAL

## 2022-12-20 MED ORDER — SCOPOLAMINE 1 MG/3DAYS TD PT72
MEDICATED_PATCH | TRANSDERMAL | Status: AC
  Filled 2022-12-20: qty 1

## 2022-12-20 MED ORDER — DEXAMETHASONE SODIUM PHOSPHATE 10 MG/ML IJ SOLN
INTRAMUSCULAR | Status: DC | PRN
  Administered 2022-12-20: 10 mg via INTRAVENOUS

## 2022-12-20 MED ORDER — SUGAMMADEX SODIUM 200 MG/2ML IV SOLN
INTRAVENOUS | Status: DC | PRN
  Administered 2022-12-20: 130 mg via INTRAVENOUS

## 2022-12-20 MED ORDER — DEXMEDETOMIDINE HCL IN NACL 80 MCG/20ML IV SOLN
INTRAVENOUS | Status: DC | PRN
  Administered 2022-12-20: 16 ug via INTRAVENOUS

## 2022-12-20 MED ORDER — DEXAMETHASONE SODIUM PHOSPHATE 10 MG/ML IJ SOLN
INTRAMUSCULAR | Status: AC
  Filled 2022-12-20: qty 1

## 2022-12-20 MED ORDER — LACTATED RINGERS IV SOLN: INTRAVENOUS | Status: DC

## 2022-12-20 MED ORDER — KETOROLAC TROMETHAMINE 30 MG/ML IJ SOLN
INTRAMUSCULAR | Status: AC
  Filled 2022-12-20: qty 1

## 2022-12-20 MED ORDER — MIDAZOLAM HCL 2 MG/2ML IJ SOLN
INTRAMUSCULAR | Status: AC
  Filled 2022-12-20: qty 2

## 2022-12-20 MED ORDER — ONDANSETRON HCL 4 MG/2ML IJ SOLN
INTRAMUSCULAR | Status: DC | PRN
  Administered 2022-12-20: 4 mg via INTRAVENOUS

## 2022-12-20 MED ORDER — SODIUM CHLORIDE (PF) 0.9 % IJ SOLN
INTRAMUSCULAR | Status: DC | PRN
  Administered 2022-12-20: 30 mL

## 2022-12-20 MED ORDER — MIDAZOLAM HCL 2 MG/2ML IJ SOLN
INTRAMUSCULAR | Status: DC | PRN
  Administered 2022-12-20: 2 mg via INTRAVENOUS

## 2022-12-20 MED ORDER — ROCURONIUM BROMIDE 10 MG/ML (PF) SYRINGE
PREFILLED_SYRINGE | INTRAVENOUS | Status: AC
  Filled 2022-12-20: qty 10

## 2022-12-20 MED ORDER — FENTANYL CITRATE (PF) 100 MCG/2ML IJ SOLN
INTRAMUSCULAR | Status: DC | PRN
  Administered 2022-12-20 (×2): 50 ug via INTRAVENOUS

## 2022-12-20 MED ORDER — LACTATED RINGERS IV SOLN
INTRAVENOUS | Status: DC
Start: 2022-12-20 — End: 2022-12-20

## 2022-12-20 SURGICAL SUPPLY — 24 items
CATH ROBINSON RED A/P 16FR (CATHETERS) ×2 IMPLANT
COVER MAYO STAND STRL (DRAPES) ×2 IMPLANT
DERMABOND ADVANCED .7 DNX12 (GAUZE/BANDAGES/DRESSINGS) ×2 IMPLANT
DRAPE SURG IRRIG POUCH 19X23 (DRAPES) ×2 IMPLANT
DURAPREP 26ML APPLICATOR (WOUND CARE) ×2 IMPLANT
GLOVE BIO SURGEON STRL SZ7 (GLOVE) ×2 IMPLANT
GOWN STRL REUS W/TWL LRG LVL3 (GOWN DISPOSABLE) ×2 IMPLANT
KIT PINK PAD W/HEAD ARE REST (MISCELLANEOUS) ×1 IMPLANT
KIT PINK PAD W/HEAD ARM REST (MISCELLANEOUS) ×2 IMPLANT
KIT TURNOVER CYSTO (KITS) ×2 IMPLANT
LIGASURE VESSEL 5MM BLUNT TIP (ELECTROSURGICAL) IMPLANT
NDL INSUFFLATION 14GA 120MM (NEEDLE) ×2 IMPLANT
NEEDLE INSUFFLATION 14GA 120MM (NEEDLE) ×1 IMPLANT
PACK LAPAROSCOPY BASIN (CUSTOM PROCEDURE TRAY) ×2 IMPLANT
PAD OB MATERNITY 4.3X12.25 (PERSONAL CARE ITEMS) ×2 IMPLANT
SET TUBE SMOKE EVAC HIGH FLOW (TUBING) ×2 IMPLANT
SLEEVE SCD COMPRESS KNEE MED (STOCKING) ×2 IMPLANT
SLEEVE Z-THREAD 5X100MM (TROCAR) ×4 IMPLANT
SUT VICRYL RAPIDE 4/0 PS 2 (SUTURE) ×2 IMPLANT
SYR 50ML LL SCALE MARK (SYRINGE) ×2 IMPLANT
TOWEL OR 17X24 6PK STRL BLUE (TOWEL DISPOSABLE) ×2 IMPLANT
TROCAR Z-THREAD BLADED 5X100MM (TROCAR) ×2 IMPLANT
WARMER LAPAROSCOPE (MISCELLANEOUS) ×2 IMPLANT
WATER STERILE IRR 500ML POUR (IV SOLUTION) IMPLANT

## 2022-12-20 NOTE — Anesthesia Postprocedure Evaluation (Signed)
Anesthesia Post Note  Patient: Alejandra George  Procedure(s) Performed: LAPAROSCOPIC BILATERAL SALPINGECTOMY (Bilateral)     Patient location during evaluation: PACU Anesthesia Type: General Level of consciousness: awake and alert Pain management: pain level controlled Vital Signs Assessment: post-procedure vital signs reviewed and stable Respiratory status: spontaneous breathing, nonlabored ventilation, respiratory function stable and patient connected to nasal cannula oxygen Cardiovascular status: blood pressure returned to baseline and stable Postop Assessment: no apparent nausea or vomiting Anesthetic complications: no  No notable events documented.  Last Vitals:  Vitals:   12/20/22 1229 12/20/22 1255  BP:  116/68  Pulse:    Resp:  20  Temp: 36.7 C   SpO2:  100%    Last Pain:  Vitals:   12/20/22 1140  TempSrc:   PainSc: Asleep                 Kennieth Rad

## 2022-12-20 NOTE — Op Note (Signed)
12/20/2022  11:27 AM  PATIENT:  Alejandra George  32 y.o. female  PRE-OPERATIVE DIAGNOSIS:  sterilization  POST-OPERATIVE DIAGNOSIS:  sterilization  PROCEDURE:  Procedure(s) with comments: LAPAROSCOPIC BILATERAL SALPINGECTOMY (Bilateral) - with filshie clips  SURGEON:  Surgeons and Role:    * Carrington Clamp, MD - Primary    * Willa Frater, MD - Assisting  ANESTHESIA:   general  EBL: <50cc  DRAINS: none   LOCAL MEDICATIONS USED:  OTHER ropivicaine  SPECIMEN:  Source of Specimen:  bilateral tubes  DISPOSITION OF SPECIMEN:  PATHOLOGY  COUNTS:  YES  TOURNIQUET:  * No tourniquets in log *  DICTATION: .Note written in EPIC  PLAN OF CARE: Discharge to home after PACU  PATIENT DISPOSITION:  PACU - hemodynamically stable.   Delay start of Pharmacological VTE agent (>24hrs) due to surgical blood loss or risk of bleeding: not applicable  Complications: none  Medications:  None  Findings:  Normal pelvis.  No lesions or adhesions seen on any structures, including the ovaries, peritoneum under ovaries, uterus, tubes, cul de sac, anterior bladder or anterior abdominal wall.  The liver edge, diaphragm and appendix were all normal.  The tubes were normal caliber and had normal fimbriae.  Technique: After adequate general anesthesia was achieved, the patient was prepped and draped in the usual fashion.  A uterine manipulator was placed in the cervix and secured.  The bladder was drained with a red rubber.  A 0.5 cm incision was made above the umbilicus and the veress needle passed inside without aspiration of bowel contents or blood.  The 5 mm trocar was placed without comp after the abdomen was insufflated.  The camera was placed inside and two 5 mm trocar placed 3 above each iliac crest under direct visualization of the camera.  A careful and systematic exploration was performed and the above findings were noted.   Each tube was tented up with million dollar forceps and then  cauterized and incised with the ligasure in the mesosalpinx.  Each tube was cauterized and incised with ligasure near the cornua and then removed through the trocar intact and sent to path.  Hemostasis was achieved.  The 5 mm trocars were removed and the skin incisions closed with subq 3-0vicryl R.  The incisions were closed with dermabond.  The patient tolerated the procedure well and was returned to the recovery room in stable condition.    Josephus Harriger A

## 2022-12-20 NOTE — Anesthesia Procedure Notes (Signed)
Procedure Name: Intubation Date/Time: 12/20/2022 10:54 AM  Performed by: Francie Massing, CRNAPre-anesthesia Checklist: Patient identified, Emergency Drugs available, Suction available and Patient being monitored Patient Re-evaluated:Patient Re-evaluated prior to induction Oxygen Delivery Method: Circle system utilized Preoxygenation: Pre-oxygenation with 100% oxygen Induction Type: IV induction Ventilation: Mask ventilation without difficulty Laryngoscope Size: Mac and 3 Grade View: Grade I Tube type: Oral Tube size: 7.0 mm Number of attempts: 1 Airway Equipment and Method: Stylet and Oral airway Placement Confirmation: ETT inserted through vocal cords under direct vision, positive ETCO2 and breath sounds checked- equal and bilateral Secured at: 22 cm Tube secured with: Tape Dental Injury: Teeth and Oropharynx as per pre-operative assessment

## 2022-12-20 NOTE — Transfer of Care (Signed)
Immediate Anesthesia Transfer of Care Note  Patient: Alejandra George  Procedure(s) Performed: Procedure(s) (LRB): LAPAROSCOPIC BILATERAL SALPINGECTOMY (Bilateral)  Patient Location: PACU  Anesthesia Type: General  Level of Consciousness: awake, oriented, sedated and patient cooperative  Airway & Oxygen Therapy: Patient Spontanous Breathing and Patient connected to face mask oxygen  Post-op Assessment: Report given to PACU RN and Post -op Vital signs reviewed and stable  Post vital signs: Reviewed and stable  Complications: No apparent anesthesia complications Last Vitals:  Vitals Value Taken Time  BP 125/93 12/20/22 1140  Temp 36.6 C 12/20/22 1140  Pulse 94 12/20/22 1141  Resp 21 12/20/22 1141  SpO2 100 % 12/20/22 1141  Vitals shown include unfiled device data.  Last Pain:  Vitals:   12/20/22 1015  TempSrc: Oral  PainSc: 3       Patients Stated Pain Goal: 5 (12/20/22 1015)  Complications: No notable events documented.

## 2022-12-20 NOTE — Brief Op Note (Signed)
12/20/2022  11:27 AM  PATIENT:  Alejandra George  32 y.o. female  PRE-OPERATIVE DIAGNOSIS:  sterilization  POST-OPERATIVE DIAGNOSIS:  sterilization  PROCEDURE:  Procedure(s) with comments: LAPAROSCOPIC BILATERAL SALPINGECTOMY (Bilateral) - with filshie clips  SURGEON:  Surgeons and Role:    * Carrington Clamp, MD - Primary    * Willa Frater, MD - Assisting  ANESTHESIA:   general  EBL: <50cc  DRAINS: none   LOCAL MEDICATIONS USED:  OTHER ropivicaine  SPECIMEN:  Source of Specimen:  bilateral tubes  DISPOSITION OF SPECIMEN:  PATHOLOGY  COUNTS:  YES  TOURNIQUET:  * No tourniquets in log *  DICTATION: .Note written in EPIC  PLAN OF CARE: Discharge to home after PACU  PATIENT DISPOSITION:  PACU - hemodynamically stable.   Delay start of Pharmacological VTE agent (>24hrs) due to surgical blood loss or risk of bleeding: not applicable

## 2022-12-20 NOTE — H&P (Signed)
There has been no change in the patients history, status or exam since the history and physical.  Vitals:   12/09/22 1112 12/20/22 1015  BP:  117/69  Pulse:  (!) 104  Resp:  17  Temp:  97.9 F (36.6 C)  TempSrc:  Oral  SpO2:  100%  Weight: 62.1 kg 63.1 kg  Height: 5\' 6"  (1.676 m) 5\' 6"  (1.676 m)    Results for orders placed or performed during the hospital encounter of 12/20/22 (from the past 72 hour(s))  Pregnancy, urine POC     Status: None   Collection Time: 12/20/22 10:01 AM  Result Value Ref Range   Preg Test, Ur NEGATIVE NEGATIVE    Comment:        THE SENSITIVITY OF THIS METHODOLOGY IS >24 mIU/mL     Loney Laurence

## 2022-12-20 NOTE — Discharge Instructions (Signed)
No acetaminophen/Tylenol until after 4:15 pm today if needed. No ibuprofen, Advil, Aleve, Motrin, ketorolac, meloxicam, naproxen, or other NSAIDS until after 5:15 pm today if needed.   Post Anesthesia Home Care Instructions  Activity: Get plenty of rest for the remainder of the day. A responsible individual must stay with you for 24 hours following the procedure.  For the next 24 hours, DO NOT: -Drive a car -Advertising copywriter -Drink alcoholic beverages -Take any medication unless instructed by your physician -Make any legal decisions or sign important papers.  Meals: Start with liquid foods such as gelatin or soup. Progress to regular foods as tolerated. Avoid greasy, spicy, heavy foods. If nausea and/or vomiting occur, drink only clear liquids until the nausea and/or vomiting subsides. Call your physician if vomiting continues.  Special Instructions/Symptoms: Your throat may feel dry or sore from the anesthesia or the breathing tube placed in your throat during surgery. If this causes discomfort, gargle with warm salt water. The discomfort should disappear within 24 hours.  If you had a scopolamine patch placed behind your ear for the management of post- operative nausea and/or vomiting:  1. The medication in the patch is effective for 72 hours, after which it should be removed.  Wrap patch in a tissue and discard in the trash. Wash hands thoroughly with soap and water. 2. You may remove the patch earlier than 72 hours if you experience unpleasant side effects which may include dry mouth, dizziness or visual disturbances. 3. Avoid touching the patch. Wash your hands with soap and water after contact with the patch.

## 2022-12-20 NOTE — Anesthesia Preprocedure Evaluation (Signed)
Anesthesia Evaluation  Patient identified by MRN, date of birth, ID band Patient awake    Reviewed: Allergy & Precautions, NPO status , Patient's Chart, lab work & pertinent test results  Airway Mallampati: II  TM Distance: >3 FB Neck ROM: Full    Dental  (+) Dental Advisory Given   Pulmonary asthma    breath sounds clear to auscultation       Cardiovascular negative cardio ROS  Rhythm:Regular Rate:Normal     Neuro/Psych  Neuromuscular disease    GI/Hepatic negative GI ROS, Neg liver ROS,,,  Endo/Other  negative endocrine ROS    Renal/GU negative Renal ROS     Musculoskeletal   Abdominal   Peds  Hematology negative hematology ROS (+)   Anesthesia Other Findings   Reproductive/Obstetrics                             Anesthesia Physical Anesthesia Plan  ASA: 2  Anesthesia Plan: General   Post-op Pain Management: Tylenol PO (pre-op)* and Toradol IV (intra-op)*   Induction: Intravenous  PONV Risk Score and Plan: 3 and 4 or greater and Dexamethasone, Ondansetron, Midazolam, Treatment may vary due to age or medical condition and Scopolamine patch - Pre-op  Airway Management Planned: Oral ETT  Additional Equipment: None  Intra-op Plan:   Post-operative Plan: Extubation in OR  Informed Consent: I have reviewed the patients History and Physical, chart, labs and discussed the procedure including the risks, benefits and alternatives for the proposed anesthesia with the patient or authorized representative who has indicated his/her understanding and acceptance.     Dental advisory given  Plan Discussed with: CRNA  Anesthesia Plan Comments:        Anesthesia Quick Evaluation

## 2022-12-23 ENCOUNTER — Encounter (HOSPITAL_BASED_OUTPATIENT_CLINIC_OR_DEPARTMENT_OTHER): Payer: Self-pay | Admitting: Obstetrics and Gynecology

## 2022-12-23 DIAGNOSIS — R0981 Nasal congestion: Secondary | ICD-10-CM | POA: Diagnosis not present

## 2022-12-23 DIAGNOSIS — J3489 Other specified disorders of nose and nasal sinuses: Secondary | ICD-10-CM | POA: Diagnosis not present

## 2022-12-23 DIAGNOSIS — R519 Headache, unspecified: Secondary | ICD-10-CM | POA: Diagnosis not present

## 2022-12-23 DIAGNOSIS — J320 Chronic maxillary sinusitis: Secondary | ICD-10-CM | POA: Diagnosis not present

## 2022-12-23 DIAGNOSIS — J343 Hypertrophy of nasal turbinates: Secondary | ICD-10-CM | POA: Diagnosis not present

## 2022-12-23 DIAGNOSIS — J342 Deviated nasal septum: Secondary | ICD-10-CM | POA: Diagnosis not present

## 2022-12-23 LAB — SURGICAL PATHOLOGY

## 2022-12-25 DIAGNOSIS — F4312 Post-traumatic stress disorder, chronic: Secondary | ICD-10-CM | POA: Diagnosis not present

## 2022-12-26 DIAGNOSIS — F332 Major depressive disorder, recurrent severe without psychotic features: Secondary | ICD-10-CM | POA: Diagnosis not present

## 2023-01-01 DIAGNOSIS — F4312 Post-traumatic stress disorder, chronic: Secondary | ICD-10-CM | POA: Diagnosis not present

## 2023-01-03 DIAGNOSIS — F332 Major depressive disorder, recurrent severe without psychotic features: Secondary | ICD-10-CM | POA: Diagnosis not present

## 2023-01-24 NOTE — Progress Notes (Deleted)
  [facility] [address] [place] [address] Phone: [phone] Subjective:    I'm seeing this patient by the request  of:  Curtis Debby PARAS, MD  CC:   YEP:[id number]  Alejandra George is a 33 y.o. female coming in with complaint of back and neck pain. OMT 10/21/2022. Patient states   Medications patient has been prescribed: None  Taking:         Reviewed prior external information including notes and imaging from previsou exam, outside providers and external EMR if available.   As well as notes that were available from care everywhere and other healthcare systems.  Past medical history, social, surgical and family history all reviewed in electronic medical record.  No pertanent information unless stated regarding to the chief complaint.   Past Medical History:  Diagnosis Date   Anxiety    Asthma    Bipolar disorder (HCC)    Depression    History of chicken pox    as a child   History of polydrug abuse (HCC)    Vaginal Pap smear, abnormal     Allergies  Allergen Reactions   Penicillins Swelling   Sulfa  Antibiotics Hives   Cephalosporins Rash     Review of Systems:  No headache, visual changes, nausea, vomiting, diarrhea, constipation, dizziness, abdominal pain, skin rash, fevers, chills, night sweats, weight loss, swollen lymph nodes, body aches, joint swelling, chest pain, shortness of breath, mood changes. POSITIVE muscle aches  Objective  There were no vitals taken for this visit.   General: No apparent distress alert and oriented x3 mood and affect normal, dressed appropriately.  HEENT: Pupils equal, extraocular movements intact  Respiratory: Patient's speak in full sentences and does not appear short of breath  Cardiovascular: No lower extremity edema, non tender, no erythema  Gait MSK:  Back   Osteopathic findings  C2 flexed rotated and side bent right C6 flexed rotated and side bent left T3 extended rotated  and side bent right inhaled rib T9 extended rotated and side bent left L2 flexed rotated and side bent right Sacrum right on right       Assessment and Plan:  No problem-specific Assessment & Plan notes found for this encounter.    Nonallopathic problems  Decision today to treat with OMT was based on Physical Exam  After verbal consent patient was treated with HVLA, ME, FPR techniques in cervical, rib, thoracic, lumbar, and sacral  areas  Patient tolerated the procedure well with improvement in symptoms  Patient given exercises, stretches and lifestyle modifications  See medications in patient instructions if given  Patient will follow up in 4-8 weeks             Note: This dictation was prepared with Dragon dictation along with smaller phrase technology. Any transcriptional errors that result from this process are unintentional.

## 2023-01-27 ENCOUNTER — Ambulatory Visit: Payer: BC Managed Care – PPO | Admitting: Family Medicine

## 2023-02-18 DIAGNOSIS — F4312 Post-traumatic stress disorder, chronic: Secondary | ICD-10-CM | POA: Diagnosis not present

## 2023-02-24 NOTE — Progress Notes (Signed)
[facility] [address] [place] [address] Phone: [phone] Subjective:   Alejandra George, am serving as a scribe for Dr. Antoine Primas.  I'm seeing this patient by the request  of:  Monica Becton, MD  CC: back and neck pain follow up   [id number]  Jakayla Schweppe is a 33 y.o. female coming in with complaint of back and neck pain. OMT on 10/21/2022. 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   Patient is waking up in mornings with lumbar spine stiffness. Also feels like she is unable to strengthen R side of her body including up into facial muscles. All joints on R side of body are painful. Also notes sensory issues in hands where part of hand will be cold while another portion might feel very hot.   Medications patient has been prescribed:   Taking:         Reviewed prior external information including notes and imaging from previsou exam, outside providers and external EMR if available.   As well as notes that were available from care everywhere and other healthcare systems.  Past medical history, social, surgical and family history all reviewed in electronic medical record.  No pertanent information unless stated regarding to the chief complaint.   Past Medical History:  Diagnosis Date   Anxiety    Asthma    Bipolar disorder (HCC)    Depression    History of chicken pox    as a child   History of polydrug abuse (HCC)    Vaginal Pap smear, abnormal     Allergies  Allergen Reactions   Penicillins Swelling   Sulfa Antibiotics Hives   Cephalosporins Rash     Review of Systems:  No headache, visual changes, nausea, vomiting, diarrhea, constipation, dizziness, abdominal pain, skin rash, fevers, chills, night sweats, weight loss, swollen lymph nodes, body aches, joint swelling, chest pain, shortness of breath, mood changes. POSITIVE muscle aches  Objective  Blood  pressure 120/82, pulse 100, height 5\' 6"  (1.676 m), weight 142 lb (64.4 kg), SpO2 100%.   General: No apparent distress alert and oriented x3 mood and affect normal, dressed appropriately.  HEENT: Pupils equal, extraocular movements intact  Respiratory: Patient's speak in full sentences and does not appear short of breath  Cardiovascular: No lower extremity edema, non tender, no erythema  Gait normal  MSK:  Back does have some tightness noted.  Some swelling and discomfort still on the right side.  Osteopathic findings C5 flexed rotated and side bent left T3 extended rotated and side bent right inhaled rib T9 extended rotated and side bent left L2 flexed rotated and side bent right Sacrum right on right       Assessment and Plan:  Numbness and tingling of right face Continues to have some of the tingling noted.  Discussed with patient which activities to do and which ones to avoid.  Continue to stay active where possible.  He does have some chronic fatigue syndrome that could be exacerbated as well.  Would like to get laboratory workup again to see if anything else is potentially contributing.  Follow-up with me again 6 to 8 weeks.  Right ankle instability Likely some lateral column overload.  Discussed icing regimen and home exercises discussed which activities to do and which ones to avoid increase activity slowly.  Discussed following bodyaches for some of the breakdown of the transverse arch.  Follow-up  with me again in 6 to 8 weeks otherwise    Nonallopathic problems  Decision today to treat with OMT was based on Physical Exam  After verbal consent patient was treated with HVLA, ME, FPR techniques in cervical, rib, thoracic, lumbar, and sacral  areas  Patient tolerated the procedure well with improvement in symptoms  Patient given exercises, stretches and lifestyle modifications  See medications in patient instructions if given  Patient will follow up in 4-8 weeks      The above documentation has been reviewed and is accurate and complete Judi Saa, DO         Note: This dictation was prepared with Dragon dictation along with smaller phrase technology. Any transcriptional errors that result from this process are unintentional.

## 2023-02-25 ENCOUNTER — Encounter: Payer: Self-pay | Admitting: Family Medicine

## 2023-02-25 ENCOUNTER — Ambulatory Visit: Payer: 59 | Admitting: Family Medicine

## 2023-02-25 VITALS — BP 120/82 | HR 100 | Ht 66.0 in | Wt 142.0 lb

## 2023-02-25 DIAGNOSIS — M9901 Segmental and somatic dysfunction of cervical region: Secondary | ICD-10-CM

## 2023-02-25 DIAGNOSIS — R200 Anesthesia of skin: Secondary | ICD-10-CM

## 2023-02-25 DIAGNOSIS — G9332 Myalgic encephalomyelitis/chronic fatigue syndrome: Secondary | ICD-10-CM

## 2023-02-25 DIAGNOSIS — R202 Paresthesia of skin: Secondary | ICD-10-CM | POA: Diagnosis not present

## 2023-02-25 DIAGNOSIS — M9902 Segmental and somatic dysfunction of thoracic region: Secondary | ICD-10-CM | POA: Diagnosis not present

## 2023-02-25 DIAGNOSIS — M9904 Segmental and somatic dysfunction of sacral region: Secondary | ICD-10-CM | POA: Diagnosis not present

## 2023-02-25 DIAGNOSIS — M797 Fibromyalgia: Secondary | ICD-10-CM

## 2023-02-25 DIAGNOSIS — M533 Sacrococcygeal disorders, not elsewhere classified: Secondary | ICD-10-CM

## 2023-02-25 DIAGNOSIS — M2550 Pain in unspecified joint: Secondary | ICD-10-CM | POA: Diagnosis not present

## 2023-02-25 DIAGNOSIS — M25371 Other instability, right ankle: Secondary | ICD-10-CM | POA: Diagnosis not present

## 2023-02-25 DIAGNOSIS — M9903 Segmental and somatic dysfunction of lumbar region: Secondary | ICD-10-CM | POA: Diagnosis not present

## 2023-02-25 DIAGNOSIS — F4312 Post-traumatic stress disorder, chronic: Secondary | ICD-10-CM | POA: Diagnosis not present

## 2023-02-25 DIAGNOSIS — M9908 Segmental and somatic dysfunction of rib cage: Secondary | ICD-10-CM

## 2023-02-25 MED ORDER — METHYLPREDNISOLONE ACETATE 80 MG/ML IJ SUSP
80.0000 mg | Freq: Once | INTRAMUSCULAR | Status: AC
  Administered 2023-02-25: 80 mg via INTRAMUSCULAR

## 2023-02-25 MED ORDER — KETOROLAC TROMETHAMINE 60 MG/2ML IM SOLN
60.0000 mg | Freq: Once | INTRAMUSCULAR | Status: AC
  Administered 2023-02-25: 60 mg via INTRAMUSCULAR

## 2023-02-25 NOTE — Patient Instructions (Addendum)
Injections in backside Labs today Exercises Spenco Total Support Orthotics Original See me in 2-3 months

## 2023-02-25 NOTE — Addendum Note (Signed)
Addended by: Rolland Bimler D on: 02/25/2023 04:46 PM   Modules accepted: Orders

## 2023-02-25 NOTE — Assessment & Plan Note (Signed)
Likely some lateral column overload.  Discussed icing regimen and home exercises discussed which activities to do and which ones to avoid increase activity slowly.  Discussed following bodyaches for some of the breakdown of the transverse arch.  Follow-up with me again in 6 to 8 weeks otherwise

## 2023-02-25 NOTE — Assessment & Plan Note (Signed)
Continues to have some of the tingling noted.  Discussed with patient which activities to do and which ones to avoid.  Continue to stay active where possible.  He does have some chronic fatigue syndrome that could be exacerbated as well.  Would like to get laboratory workup again to see if anything else is potentially contributing.  Follow-up with me again 6 to 8 weeks.

## 2023-02-26 LAB — VITAMIN B12: Vitamin B-12: 517 pg/mL (ref 211–911)

## 2023-02-26 LAB — IBC PANEL
Iron: 87 ug/dL (ref 42–145)
Saturation Ratios: 29.3 % (ref 20.0–50.0)
TIBC: 296.8 ug/dL (ref 250.0–450.0)
Transferrin: 212 mg/dL (ref 212.0–360.0)

## 2023-02-26 LAB — CBC WITH DIFFERENTIAL/PLATELET
Basophils Absolute: 0 10*3/uL (ref 0.0–0.1)
Basophils Relative: 0.7 % (ref 0.0–3.0)
Eosinophils Absolute: 0.2 10*3/uL (ref 0.0–0.7)
Eosinophils Relative: 3.3 % (ref 0.0–5.0)
HCT: 39 % (ref 36.0–46.0)
Hemoglobin: 13.1 g/dL (ref 12.0–15.0)
Lymphocytes Relative: 29.3 % (ref 12.0–46.0)
Lymphs Abs: 1.9 10*3/uL (ref 0.7–4.0)
MCHC: 33.6 g/dL (ref 30.0–36.0)
MCV: 91.4 fL (ref 78.0–100.0)
Monocytes Absolute: 0.5 10*3/uL (ref 0.1–1.0)
Monocytes Relative: 7.3 % (ref 3.0–12.0)
Neutro Abs: 3.9 10*3/uL (ref 1.4–7.7)
Neutrophils Relative %: 59.4 % (ref 43.0–77.0)
Platelets: 299 10*3/uL (ref 150.0–400.0)
RBC: 4.27 Mil/uL (ref 3.87–5.11)
RDW: 12.6 % (ref 11.5–15.5)
WBC: 6.5 10*3/uL (ref 4.0–10.5)

## 2023-02-26 LAB — VITAMIN D 25 HYDROXY (VIT D DEFICIENCY, FRACTURES): VITD: 39.86 ng/mL (ref 30.00–100.00)

## 2023-02-26 LAB — SEDIMENTATION RATE: Sed Rate: 7 mm/h (ref 0–20)

## 2023-02-26 LAB — CORTISOL: Cortisol, Plasma: 5.6 ug/dL

## 2023-02-26 LAB — FERRITIN: Ferritin: 19.1 ng/mL (ref 10.0–291.0)

## 2023-02-28 DIAGNOSIS — F332 Major depressive disorder, recurrent severe without psychotic features: Secondary | ICD-10-CM | POA: Diagnosis not present

## 2023-03-03 DIAGNOSIS — F332 Major depressive disorder, recurrent severe without psychotic features: Secondary | ICD-10-CM | POA: Diagnosis not present

## 2023-03-06 ENCOUNTER — Encounter: Payer: Self-pay | Admitting: Family Medicine

## 2023-03-06 NOTE — Telephone Encounter (Signed)
Forwarding to Dr. Katrinka Blazing to review and advise.

## 2023-03-11 DIAGNOSIS — F4312 Post-traumatic stress disorder, chronic: Secondary | ICD-10-CM | POA: Diagnosis not present

## 2023-03-17 DIAGNOSIS — F332 Major depressive disorder, recurrent severe without psychotic features: Secondary | ICD-10-CM | POA: Diagnosis not present

## 2023-03-18 DIAGNOSIS — F4312 Post-traumatic stress disorder, chronic: Secondary | ICD-10-CM | POA: Diagnosis not present

## 2023-03-24 DIAGNOSIS — G4486 Cervicogenic headache: Secondary | ICD-10-CM | POA: Diagnosis not present

## 2023-03-24 DIAGNOSIS — F909 Attention-deficit hyperactivity disorder, unspecified type: Secondary | ICD-10-CM | POA: Diagnosis not present

## 2023-03-24 DIAGNOSIS — F39 Unspecified mood [affective] disorder: Secondary | ICD-10-CM | POA: Diagnosis not present

## 2023-03-24 DIAGNOSIS — G9332 Myalgic encephalomyelitis/chronic fatigue syndrome: Secondary | ICD-10-CM | POA: Diagnosis not present

## 2023-03-24 DIAGNOSIS — J4530 Mild persistent asthma, uncomplicated: Secondary | ICD-10-CM | POA: Diagnosis not present

## 2023-03-24 DIAGNOSIS — Z7689 Persons encountering health services in other specified circumstances: Secondary | ICD-10-CM | POA: Diagnosis not present

## 2023-03-24 DIAGNOSIS — K921 Melena: Secondary | ICD-10-CM | POA: Diagnosis not present

## 2023-03-26 DIAGNOSIS — F4312 Post-traumatic stress disorder, chronic: Secondary | ICD-10-CM | POA: Diagnosis not present

## 2023-04-01 DIAGNOSIS — F4312 Post-traumatic stress disorder, chronic: Secondary | ICD-10-CM | POA: Diagnosis not present

## 2023-04-03 DIAGNOSIS — K581 Irritable bowel syndrome with constipation: Secondary | ICD-10-CM | POA: Diagnosis not present

## 2023-04-03 DIAGNOSIS — F332 Major depressive disorder, recurrent severe without psychotic features: Secondary | ICD-10-CM | POA: Diagnosis not present

## 2023-04-03 DIAGNOSIS — E611 Iron deficiency: Secondary | ICD-10-CM | POA: Diagnosis not present

## 2023-04-08 DIAGNOSIS — F4312 Post-traumatic stress disorder, chronic: Secondary | ICD-10-CM | POA: Diagnosis not present

## 2023-04-15 DIAGNOSIS — F4312 Post-traumatic stress disorder, chronic: Secondary | ICD-10-CM | POA: Diagnosis not present

## 2023-04-18 DIAGNOSIS — F411 Generalized anxiety disorder: Secondary | ICD-10-CM | POA: Diagnosis not present

## 2023-04-18 DIAGNOSIS — F4312 Post-traumatic stress disorder, chronic: Secondary | ICD-10-CM | POA: Diagnosis not present

## 2023-04-18 DIAGNOSIS — F332 Major depressive disorder, recurrent severe without psychotic features: Secondary | ICD-10-CM | POA: Diagnosis not present

## 2023-04-18 DIAGNOSIS — F331 Major depressive disorder, recurrent, moderate: Secondary | ICD-10-CM | POA: Diagnosis not present

## 2023-04-21 DIAGNOSIS — K921 Melena: Secondary | ICD-10-CM | POA: Diagnosis not present

## 2023-04-21 DIAGNOSIS — Z113 Encounter for screening for infections with a predominantly sexual mode of transmission: Secondary | ICD-10-CM | POA: Diagnosis not present

## 2023-04-21 DIAGNOSIS — Z13220 Encounter for screening for lipoid disorders: Secondary | ICD-10-CM | POA: Diagnosis not present

## 2023-04-21 DIAGNOSIS — Z23 Encounter for immunization: Secondary | ICD-10-CM | POA: Diagnosis not present

## 2023-04-21 DIAGNOSIS — E611 Iron deficiency: Secondary | ICD-10-CM | POA: Diagnosis not present

## 2023-04-21 DIAGNOSIS — Z0000 Encounter for general adult medical examination without abnormal findings: Secondary | ICD-10-CM | POA: Diagnosis not present

## 2023-04-21 DIAGNOSIS — J4530 Mild persistent asthma, uncomplicated: Secondary | ICD-10-CM | POA: Diagnosis not present

## 2023-04-21 DIAGNOSIS — G4719 Other hypersomnia: Secondary | ICD-10-CM | POA: Diagnosis not present

## 2023-04-21 DIAGNOSIS — Z111 Encounter for screening for respiratory tuberculosis: Secondary | ICD-10-CM | POA: Diagnosis not present

## 2023-04-21 DIAGNOSIS — Z1159 Encounter for screening for other viral diseases: Secondary | ICD-10-CM | POA: Diagnosis not present

## 2023-04-29 DIAGNOSIS — F4312 Post-traumatic stress disorder, chronic: Secondary | ICD-10-CM | POA: Diagnosis not present

## 2023-04-29 DIAGNOSIS — F331 Major depressive disorder, recurrent, moderate: Secondary | ICD-10-CM | POA: Diagnosis not present

## 2023-05-05 DIAGNOSIS — F909 Attention-deficit hyperactivity disorder, unspecified type: Secondary | ICD-10-CM | POA: Diagnosis not present

## 2023-05-05 DIAGNOSIS — F4312 Post-traumatic stress disorder, chronic: Secondary | ICD-10-CM | POA: Diagnosis not present

## 2023-05-05 DIAGNOSIS — F39 Unspecified mood [affective] disorder: Secondary | ICD-10-CM | POA: Diagnosis not present

## 2023-05-06 DIAGNOSIS — F4312 Post-traumatic stress disorder, chronic: Secondary | ICD-10-CM | POA: Diagnosis not present

## 2023-05-06 DIAGNOSIS — F331 Major depressive disorder, recurrent, moderate: Secondary | ICD-10-CM | POA: Diagnosis not present

## 2023-05-13 ENCOUNTER — Ambulatory Visit: Payer: 59 | Admitting: Family Medicine

## 2023-05-13 DIAGNOSIS — F4312 Post-traumatic stress disorder, chronic: Secondary | ICD-10-CM | POA: Diagnosis not present

## 2023-05-16 DIAGNOSIS — R220 Localized swelling, mass and lump, head: Secondary | ICD-10-CM | POA: Diagnosis not present

## 2023-05-16 DIAGNOSIS — F39 Unspecified mood [affective] disorder: Secondary | ICD-10-CM | POA: Diagnosis not present

## 2023-05-20 DIAGNOSIS — F900 Attention-deficit hyperactivity disorder, predominantly inattentive type: Secondary | ICD-10-CM | POA: Diagnosis not present

## 2023-05-20 DIAGNOSIS — F3342 Major depressive disorder, recurrent, in full remission: Secondary | ICD-10-CM | POA: Diagnosis not present

## 2023-05-20 DIAGNOSIS — F4312 Post-traumatic stress disorder, chronic: Secondary | ICD-10-CM | POA: Diagnosis not present

## 2023-06-02 DIAGNOSIS — E063 Autoimmune thyroiditis: Secondary | ICD-10-CM | POA: Diagnosis not present

## 2023-06-02 DIAGNOSIS — R6882 Decreased libido: Secondary | ICD-10-CM | POA: Diagnosis not present

## 2023-06-02 DIAGNOSIS — R5382 Chronic fatigue, unspecified: Secondary | ICD-10-CM | POA: Diagnosis not present

## 2023-06-02 DIAGNOSIS — N951 Menopausal and female climacteric states: Secondary | ICD-10-CM | POA: Diagnosis not present

## 2023-06-02 DIAGNOSIS — R232 Flushing: Secondary | ICD-10-CM | POA: Diagnosis not present

## 2023-06-02 DIAGNOSIS — Z79890 Hormone replacement therapy (postmenopausal): Secondary | ICD-10-CM | POA: Diagnosis not present

## 2023-06-02 DIAGNOSIS — G4700 Insomnia, unspecified: Secondary | ICD-10-CM | POA: Diagnosis not present

## 2023-06-02 DIAGNOSIS — F4310 Post-traumatic stress disorder, unspecified: Secondary | ICD-10-CM | POA: Diagnosis not present

## 2023-06-02 DIAGNOSIS — Z6823 Body mass index (BMI) 23.0-23.9, adult: Secondary | ICD-10-CM | POA: Diagnosis not present

## 2023-06-03 DIAGNOSIS — F4312 Post-traumatic stress disorder, chronic: Secondary | ICD-10-CM | POA: Diagnosis not present

## 2023-06-05 DIAGNOSIS — F332 Major depressive disorder, recurrent severe without psychotic features: Secondary | ICD-10-CM | POA: Diagnosis not present

## 2023-06-09 DIAGNOSIS — F4312 Post-traumatic stress disorder, chronic: Secondary | ICD-10-CM | POA: Diagnosis not present

## 2023-06-09 DIAGNOSIS — F909 Attention-deficit hyperactivity disorder, unspecified type: Secondary | ICD-10-CM | POA: Diagnosis not present

## 2023-06-09 DIAGNOSIS — F39 Unspecified mood [affective] disorder: Secondary | ICD-10-CM | POA: Diagnosis not present

## 2023-06-10 DIAGNOSIS — F4312 Post-traumatic stress disorder, chronic: Secondary | ICD-10-CM | POA: Diagnosis not present

## 2023-06-13 DIAGNOSIS — F332 Major depressive disorder, recurrent severe without psychotic features: Secondary | ICD-10-CM | POA: Diagnosis not present

## 2023-06-23 DIAGNOSIS — F332 Major depressive disorder, recurrent severe without psychotic features: Secondary | ICD-10-CM | POA: Diagnosis not present

## 2023-06-24 DIAGNOSIS — F4312 Post-traumatic stress disorder, chronic: Secondary | ICD-10-CM | POA: Diagnosis not present

## 2023-06-24 DIAGNOSIS — F909 Attention-deficit hyperactivity disorder, unspecified type: Secondary | ICD-10-CM | POA: Diagnosis not present

## 2023-06-24 DIAGNOSIS — F39 Unspecified mood [affective] disorder: Secondary | ICD-10-CM | POA: Diagnosis not present

## 2023-07-01 DIAGNOSIS — F4312 Post-traumatic stress disorder, chronic: Secondary | ICD-10-CM | POA: Diagnosis not present

## 2023-07-02 DIAGNOSIS — N951 Menopausal and female climacteric states: Secondary | ICD-10-CM | POA: Diagnosis not present

## 2023-07-02 DIAGNOSIS — F329 Major depressive disorder, single episode, unspecified: Secondary | ICD-10-CM | POA: Diagnosis not present

## 2023-07-02 DIAGNOSIS — R232 Flushing: Secondary | ICD-10-CM | POA: Diagnosis not present

## 2023-07-02 DIAGNOSIS — R6882 Decreased libido: Secondary | ICD-10-CM | POA: Diagnosis not present

## 2023-07-02 DIAGNOSIS — Z6823 Body mass index (BMI) 23.0-23.9, adult: Secondary | ICD-10-CM | POA: Diagnosis not present

## 2023-07-07 DIAGNOSIS — F39 Unspecified mood [affective] disorder: Secondary | ICD-10-CM | POA: Diagnosis not present

## 2023-07-07 DIAGNOSIS — F332 Major depressive disorder, recurrent severe without psychotic features: Secondary | ICD-10-CM | POA: Diagnosis not present

## 2023-07-07 DIAGNOSIS — F909 Attention-deficit hyperactivity disorder, unspecified type: Secondary | ICD-10-CM | POA: Diagnosis not present

## 2023-07-08 DIAGNOSIS — F4312 Post-traumatic stress disorder, chronic: Secondary | ICD-10-CM | POA: Diagnosis not present

## 2023-07-10 DIAGNOSIS — G4710 Hypersomnia, unspecified: Secondary | ICD-10-CM | POA: Diagnosis not present

## 2023-07-10 DIAGNOSIS — G4700 Insomnia, unspecified: Secondary | ICD-10-CM | POA: Diagnosis not present

## 2023-07-10 DIAGNOSIS — G9332 Myalgic encephalomyelitis/chronic fatigue syndrome: Secondary | ICD-10-CM | POA: Diagnosis not present

## 2023-07-16 DIAGNOSIS — F3342 Major depressive disorder, recurrent, in full remission: Secondary | ICD-10-CM | POA: Diagnosis not present

## 2023-07-16 DIAGNOSIS — F900 Attention-deficit hyperactivity disorder, predominantly inattentive type: Secondary | ICD-10-CM | POA: Diagnosis not present

## 2023-07-17 DIAGNOSIS — F4312 Post-traumatic stress disorder, chronic: Secondary | ICD-10-CM | POA: Diagnosis not present

## 2023-07-22 DIAGNOSIS — F4312 Post-traumatic stress disorder, chronic: Secondary | ICD-10-CM | POA: Diagnosis not present

## 2023-07-29 DIAGNOSIS — F4312 Post-traumatic stress disorder, chronic: Secondary | ICD-10-CM | POA: Diagnosis not present

## 2023-07-31 DIAGNOSIS — F332 Major depressive disorder, recurrent severe without psychotic features: Secondary | ICD-10-CM | POA: Diagnosis not present

## 2023-08-05 DIAGNOSIS — F4312 Post-traumatic stress disorder, chronic: Secondary | ICD-10-CM | POA: Diagnosis not present

## 2023-08-18 DIAGNOSIS — F4312 Post-traumatic stress disorder, chronic: Secondary | ICD-10-CM | POA: Diagnosis not present

## 2023-08-26 DIAGNOSIS — F4312 Post-traumatic stress disorder, chronic: Secondary | ICD-10-CM | POA: Diagnosis not present

## 2023-08-28 DIAGNOSIS — F332 Major depressive disorder, recurrent severe without psychotic features: Secondary | ICD-10-CM | POA: Diagnosis not present

## 2023-09-02 DIAGNOSIS — F332 Major depressive disorder, recurrent severe without psychotic features: Secondary | ICD-10-CM | POA: Diagnosis not present

## 2023-09-04 DIAGNOSIS — Z6824 Body mass index (BMI) 24.0-24.9, adult: Secondary | ICD-10-CM | POA: Diagnosis not present

## 2023-09-04 DIAGNOSIS — R5382 Chronic fatigue, unspecified: Secondary | ICD-10-CM | POA: Diagnosis not present

## 2023-09-04 DIAGNOSIS — N951 Menopausal and female climacteric states: Secondary | ICD-10-CM | POA: Diagnosis not present

## 2023-09-04 DIAGNOSIS — R6882 Decreased libido: Secondary | ICD-10-CM | POA: Diagnosis not present

## 2023-09-05 DIAGNOSIS — F332 Major depressive disorder, recurrent severe without psychotic features: Secondary | ICD-10-CM | POA: Diagnosis not present

## 2023-09-09 ENCOUNTER — Other Ambulatory Visit: Payer: Self-pay | Admitting: Otolaryngology

## 2023-09-09 DIAGNOSIS — N760 Acute vaginitis: Secondary | ICD-10-CM | POA: Diagnosis not present

## 2023-09-09 DIAGNOSIS — N898 Other specified noninflammatory disorders of vagina: Secondary | ICD-10-CM | POA: Diagnosis not present

## 2023-09-09 DIAGNOSIS — Z113 Encounter for screening for infections with a predominantly sexual mode of transmission: Secondary | ICD-10-CM | POA: Diagnosis not present

## 2023-09-09 DIAGNOSIS — Z01419 Encounter for gynecological examination (general) (routine) without abnormal findings: Secondary | ICD-10-CM | POA: Diagnosis not present

## 2023-09-11 DIAGNOSIS — F4312 Post-traumatic stress disorder, chronic: Secondary | ICD-10-CM | POA: Diagnosis not present

## 2023-09-17 DIAGNOSIS — F4312 Post-traumatic stress disorder, chronic: Secondary | ICD-10-CM | POA: Diagnosis not present

## 2023-09-17 DIAGNOSIS — R300 Dysuria: Secondary | ICD-10-CM | POA: Diagnosis not present

## 2023-09-24 DIAGNOSIS — J309 Allergic rhinitis, unspecified: Secondary | ICD-10-CM | POA: Diagnosis not present

## 2023-09-24 DIAGNOSIS — J342 Deviated nasal septum: Secondary | ICD-10-CM | POA: Diagnosis not present

## 2023-09-24 DIAGNOSIS — R519 Headache, unspecified: Secondary | ICD-10-CM | POA: Diagnosis not present

## 2023-09-24 DIAGNOSIS — J343 Hypertrophy of nasal turbinates: Secondary | ICD-10-CM | POA: Diagnosis not present

## 2023-09-24 DIAGNOSIS — R0981 Nasal congestion: Secondary | ICD-10-CM | POA: Diagnosis not present

## 2023-09-24 DIAGNOSIS — J320 Chronic maxillary sinusitis: Secondary | ICD-10-CM | POA: Diagnosis not present

## 2023-09-25 DIAGNOSIS — F4312 Post-traumatic stress disorder, chronic: Secondary | ICD-10-CM | POA: Diagnosis not present

## 2023-09-30 DIAGNOSIS — F900 Attention-deficit hyperactivity disorder, predominantly inattentive type: Secondary | ICD-10-CM | POA: Diagnosis not present

## 2023-09-30 DIAGNOSIS — F3341 Major depressive disorder, recurrent, in partial remission: Secondary | ICD-10-CM | POA: Diagnosis not present

## 2023-09-30 DIAGNOSIS — F411 Generalized anxiety disorder: Secondary | ICD-10-CM | POA: Diagnosis not present

## 2023-10-01 DIAGNOSIS — F332 Major depressive disorder, recurrent severe without psychotic features: Secondary | ICD-10-CM | POA: Diagnosis not present

## 2023-10-02 DIAGNOSIS — Z113 Encounter for screening for infections with a predominantly sexual mode of transmission: Secondary | ICD-10-CM | POA: Diagnosis not present

## 2023-10-02 DIAGNOSIS — R21 Rash and other nonspecific skin eruption: Secondary | ICD-10-CM | POA: Diagnosis not present

## 2023-10-06 DIAGNOSIS — R3915 Urgency of urination: Secondary | ICD-10-CM | POA: Diagnosis not present

## 2023-10-06 DIAGNOSIS — R21 Rash and other nonspecific skin eruption: Secondary | ICD-10-CM | POA: Diagnosis not present

## 2023-10-06 DIAGNOSIS — R768 Other specified abnormal immunological findings in serum: Secondary | ICD-10-CM | POA: Diagnosis not present

## 2023-10-07 DIAGNOSIS — R768 Other specified abnormal immunological findings in serum: Secondary | ICD-10-CM | POA: Diagnosis not present

## 2023-10-07 DIAGNOSIS — R3915 Urgency of urination: Secondary | ICD-10-CM | POA: Diagnosis not present

## 2023-10-08 DIAGNOSIS — F4312 Post-traumatic stress disorder, chronic: Secondary | ICD-10-CM | POA: Diagnosis not present

## 2023-10-08 DIAGNOSIS — E063 Autoimmune thyroiditis: Secondary | ICD-10-CM | POA: Diagnosis not present

## 2023-10-08 DIAGNOSIS — R232 Flushing: Secondary | ICD-10-CM | POA: Diagnosis not present

## 2023-10-08 DIAGNOSIS — R6882 Decreased libido: Secondary | ICD-10-CM | POA: Diagnosis not present

## 2023-10-08 DIAGNOSIS — Z79890 Hormone replacement therapy (postmenopausal): Secondary | ICD-10-CM | POA: Diagnosis not present

## 2023-10-08 DIAGNOSIS — Z6824 Body mass index (BMI) 24.0-24.9, adult: Secondary | ICD-10-CM | POA: Diagnosis not present

## 2023-10-08 DIAGNOSIS — N951 Menopausal and female climacteric states: Secondary | ICD-10-CM | POA: Diagnosis not present

## 2023-10-10 DIAGNOSIS — F332 Major depressive disorder, recurrent severe without psychotic features: Secondary | ICD-10-CM | POA: Diagnosis not present

## 2023-10-15 DIAGNOSIS — F4312 Post-traumatic stress disorder, chronic: Secondary | ICD-10-CM | POA: Diagnosis not present

## 2023-10-20 ENCOUNTER — Other Ambulatory Visit: Payer: Self-pay

## 2023-10-20 ENCOUNTER — Encounter (HOSPITAL_BASED_OUTPATIENT_CLINIC_OR_DEPARTMENT_OTHER): Payer: Self-pay | Admitting: Otolaryngology

## 2023-10-22 DIAGNOSIS — F4312 Post-traumatic stress disorder, chronic: Secondary | ICD-10-CM | POA: Diagnosis not present

## 2023-10-24 ENCOUNTER — Other Ambulatory Visit: Payer: Self-pay | Admitting: Medical Genetics

## 2023-10-24 DIAGNOSIS — Z006 Encounter for examination for normal comparison and control in clinical research program: Secondary | ICD-10-CM

## 2023-10-27 NOTE — Anesthesia Preprocedure Evaluation (Signed)
 Anesthesia Evaluation  Patient identified by MRN, date of birth, ID band Patient awake    Reviewed: Allergy & Precautions, NPO status , Patient's Chart, lab work & pertinent test results  History of Anesthesia Complications Negative for: history of anesthetic complications  Airway Mallampati: I  TM Distance: >3 FB Neck ROM: Full    Dental no notable dental hx. (+) Teeth Intact, Dental Advisory Given   Pulmonary asthma    Pulmonary exam normal breath sounds clear to auscultation       Cardiovascular (-) hypertension(-) angina (-) Past MI Normal cardiovascular exam Rhythm:Regular Rate:Normal     Neuro/Psych  PSYCHIATRIC DISORDERS Anxiety Depression Bipolar Disorder    Neuromuscular disease    GI/Hepatic negative GI ROS, Neg liver ROS,,,  Endo/Other    Renal/GU negative Renal ROS     Musculoskeletal  (+)  Fibromyalgia -  Abdominal   Peds  Hematology   Anesthesia Other Findings All: PCN, sulfa  cephlosporins  Reproductive/Obstetrics negative OB ROS                              Anesthesia Physical Anesthesia Plan  ASA: 2  Anesthesia Plan: General   Post-op Pain Management: Ofirmev  IV (intra-op)*   Induction: Intravenous  PONV Risk Score and Plan: 4 or greater and Treatment may vary due to age or medical condition, Midazolam , Dexamethasone  and Ondansetron   Airway Management Planned: Oral ETT  Additional Equipment: None  Intra-op Plan:   Post-operative Plan: Extubation in OR  Informed Consent: I have reviewed the patients History and Physical, chart, labs and discussed the procedure including the risks, benefits and alternatives for the proposed anesthesia with the patient or authorized representative who has indicated his/her understanding and acceptance.     Dental advisory given  Plan Discussed with: CRNA and Surgeon  Anesthesia Plan Comments:          Anesthesia  Quick Evaluation

## 2023-10-27 NOTE — Progress Notes (Signed)

## 2023-10-28 ENCOUNTER — Ambulatory Visit (HOSPITAL_BASED_OUTPATIENT_CLINIC_OR_DEPARTMENT_OTHER)
Admission: RE | Admit: 2023-10-28 | Discharge: 2023-10-28 | Disposition: A | Discharge status: Home / Self Care | Attending: Otolaryngology | Admitting: Otolaryngology

## 2023-10-28 ENCOUNTER — Encounter (HOSPITAL_BASED_OUTPATIENT_CLINIC_OR_DEPARTMENT_OTHER): Payer: Self-pay | Admitting: Anesthesiology

## 2023-10-28 ENCOUNTER — Other Ambulatory Visit: Payer: Self-pay

## 2023-10-28 ENCOUNTER — Ambulatory Visit (HOSPITAL_BASED_OUTPATIENT_CLINIC_OR_DEPARTMENT_OTHER): Payer: Self-pay | Admitting: Anesthesiology

## 2023-10-28 ENCOUNTER — Encounter (HOSPITAL_BASED_OUTPATIENT_CLINIC_OR_DEPARTMENT_OTHER)
Admission: RE | Disposition: A | Discharge status: Home / Self Care | Payer: Self-pay | Source: Home / Self Care | Attending: Otolaryngology

## 2023-10-28 ENCOUNTER — Encounter (HOSPITAL_BASED_OUTPATIENT_CLINIC_OR_DEPARTMENT_OTHER): Payer: Self-pay | Admitting: Otolaryngology

## 2023-10-28 DIAGNOSIS — J310 Chronic rhinitis: Secondary | ICD-10-CM | POA: Diagnosis not present

## 2023-10-28 DIAGNOSIS — R519 Headache, unspecified: Secondary | ICD-10-CM | POA: Diagnosis not present

## 2023-10-28 DIAGNOSIS — J309 Allergic rhinitis, unspecified: Secondary | ICD-10-CM | POA: Diagnosis not present

## 2023-10-28 DIAGNOSIS — M797 Fibromyalgia: Secondary | ICD-10-CM | POA: Insufficient documentation

## 2023-10-28 DIAGNOSIS — J320 Chronic maxillary sinusitis: Secondary | ICD-10-CM | POA: Insufficient documentation

## 2023-10-28 DIAGNOSIS — J45909 Unspecified asthma, uncomplicated: Secondary | ICD-10-CM | POA: Diagnosis not present

## 2023-10-28 DIAGNOSIS — Z01818 Encounter for other preprocedural examination: Secondary | ICD-10-CM

## 2023-10-28 DIAGNOSIS — F319 Bipolar disorder, unspecified: Secondary | ICD-10-CM | POA: Diagnosis not present

## 2023-10-28 DIAGNOSIS — F419 Anxiety disorder, unspecified: Secondary | ICD-10-CM | POA: Insufficient documentation

## 2023-10-28 HISTORY — DX: Fibromyalgia: M79.7

## 2023-10-28 HISTORY — DX: Other constipation: K59.09

## 2023-10-28 HISTORY — PX: TURBINATE REDUCTION: SHX6157

## 2023-10-28 HISTORY — PX: SINUS ENDO W/FUSION: SHX777

## 2023-10-28 LAB — POCT PREGNANCY, URINE: Preg Test, Ur: NEGATIVE

## 2023-10-28 SURGERY — SINUS SURGERY, ENDOSCOPIC, USING COMPUTER-ASSISTED NAVIGATION
Anesthesia: General | Site: Nose | Laterality: Right

## 2023-10-28 MED ORDER — LIDOCAINE-EPINEPHRINE 1 %-1:100000 IJ SOLN
INTRAMUSCULAR | Status: DC | PRN
  Administered 2023-10-28: 9.8 mL

## 2023-10-28 MED ORDER — 0.9 % SODIUM CHLORIDE (POUR BTL) OPTIME
TOPICAL | Status: DC | PRN
  Administered 2023-10-28: 500 mL

## 2023-10-28 MED ORDER — ONDANSETRON HCL 4 MG/2ML IJ SOLN
INTRAMUSCULAR | Status: DC | PRN
  Administered 2023-10-28: 4 mg via INTRAVENOUS

## 2023-10-28 MED ORDER — LIDOCAINE 2% (20 MG/ML) 5 ML SYRINGE
INTRAMUSCULAR | Status: DC | PRN
  Administered 2023-10-28: 60 mg via INTRAVENOUS

## 2023-10-28 MED ORDER — CLINDAMYCIN PHOSPHATE 600 MG/50ML IV SOLN
INTRAVENOUS | Status: DC | PRN
  Administered 2023-10-28: 600 mg via INTRAVENOUS

## 2023-10-28 MED ORDER — HYDROMORPHONE HCL 1 MG/ML IJ SOLN
INTRAMUSCULAR | Status: AC
  Filled 2023-10-28: qty 0.5

## 2023-10-28 MED ORDER — MIDAZOLAM HCL 2 MG/2ML IJ SOLN
INTRAMUSCULAR | Status: DC | PRN
  Administered 2023-10-28: 2 mg via INTRAVENOUS

## 2023-10-28 MED ORDER — LACTATED RINGERS IV SOLN: INTRAVENOUS | Status: DC

## 2023-10-28 MED ORDER — SUGAMMADEX SODIUM 200 MG/2ML IV SOLN
INTRAVENOUS | Status: DC | PRN
  Administered 2023-10-28: 150 mg via INTRAVENOUS

## 2023-10-28 MED ORDER — DEXAMETHASONE SOD PHOSPHATE PF 10 MG/ML IJ SOLN
INTRAMUSCULAR | Status: DC | PRN
  Administered 2023-10-28: 10 mg via INTRAVENOUS

## 2023-10-28 MED ORDER — FENTANYL CITRATE (PF) 100 MCG/2ML IJ SOLN
INTRAMUSCULAR | Status: AC
  Filled 2023-10-28: qty 2

## 2023-10-28 MED ORDER — SODIUM CHLORIDE 0.9 % IR SOLN
Status: DC | PRN
  Administered 2023-10-28: 100 mL

## 2023-10-28 MED ORDER — EPINEPHRINE HCL (NASAL) 0.1 % NA SOLN
NASAL | Status: DC | PRN
  Administered 2023-10-28: 10 mL via NASAL

## 2023-10-28 MED ORDER — MIDAZOLAM HCL 2 MG/2ML IJ SOLN
INTRAMUSCULAR | Status: AC
  Filled 2023-10-28: qty 2

## 2023-10-28 MED ORDER — ROCURONIUM BROMIDE 10 MG/ML (PF) SYRINGE
PREFILLED_SYRINGE | INTRAVENOUS | Status: DC | PRN
Start: 2023-10-28 — End: 2023-10-28
  Administered 2023-10-28: 40 mg via INTRAVENOUS

## 2023-10-28 MED ORDER — PREDNISONE 10 MG PO TABS
ORAL_TABLET | ORAL | 0 refills | Status: AC
Start: 2023-10-28 — End: 2023-11-04

## 2023-10-28 MED ORDER — OXYCODONE HCL 5 MG/5ML PO SOLN: 5.0000 mg | Freq: Once | ORAL | Status: DC | PRN

## 2023-10-28 MED ORDER — DEXMEDETOMIDINE HCL IN NACL 80 MCG/20ML IV SOLN
INTRAVENOUS | Status: DC | PRN
  Administered 2023-10-28: 12 ug via INTRAVENOUS

## 2023-10-28 MED ORDER — HYDROMORPHONE HCL 1 MG/ML IJ SOLN
INTRAMUSCULAR | Status: DC | PRN
  Administered 2023-10-28: .5 mg via INTRAVENOUS

## 2023-10-28 MED ORDER — DROPERIDOL 2.5 MG/ML IJ SOLN: 0.6250 mg | Freq: Once | INTRAMUSCULAR | Status: DC | PRN

## 2023-10-28 MED ORDER — FENTANYL CITRATE (PF) 100 MCG/2ML IJ SOLN
INTRAMUSCULAR | Status: DC | PRN
  Administered 2023-10-28 (×2): 50 ug via INTRAVENOUS

## 2023-10-28 MED ORDER — OXYCODONE HCL 5 MG PO TABS: 5.0000 mg | ORAL_TABLET | Freq: Once | ORAL | Status: DC | PRN

## 2023-10-28 MED ORDER — PROPOFOL 10 MG/ML IV BOLUS
INTRAVENOUS | Status: DC | PRN
Start: 2023-10-28 — End: 2023-10-28
  Administered 2023-10-28: 150 mg via INTRAVENOUS

## 2023-10-28 MED ORDER — HYDROMORPHONE HCL 1 MG/ML IJ SOLN
0.2500 mg | INTRAMUSCULAR | Status: DC | PRN
  Administered 2023-10-28: 0.5 mg via INTRAVENOUS

## 2023-10-28 SURGICAL SUPPLY — 53 items
BALLOON FRONTAL NUVENT 6X17 (BALLOONS) IMPLANT
BALLOON FRONTAL NVNT 6X17 70D (BALLOONS) IMPLANT
BLADE INF TURB ROT M4 2 5PK (BLADE) IMPLANT
BLADE NAVIG QUADCUT 4.3X13 M4 (BLADE) IMPLANT
BLADE PED QUAD 3.4 EM (BLADE) IMPLANT
BLADE ROTATE RAD 40 4 M4 (BLADE) IMPLANT
BLADE ROTATE TRICUT 4X13 M4 (BLADE) IMPLANT
BLADE SURG 15 STRL LF DISP TIS (BLADE) IMPLANT
CANISTER SUC SOCK COL 7IN (MISCELLANEOUS) IMPLANT
CANISTER SUCT 1200ML W/VALVE (MISCELLANEOUS) ×6 IMPLANT
COAGULATOR SUCT 8FR VV (MISCELLANEOUS) IMPLANT
COAGULATOR SUCT SWTCH 10FR 6 (ELECTROSURGICAL) IMPLANT
DRESSING NASAL KENNEDY 3.5X.9 (MISCELLANEOUS) IMPLANT
DRSG NASOPORE 8CM (GAUZE/BANDAGES/DRESSINGS) IMPLANT
ELECTRODE REM PT RTRN 9FT ADLT (ELECTROSURGICAL) ×3 IMPLANT
GAUZE SPONGE 2X2 STRL 8-PLY (GAUZE/BANDAGES/DRESSINGS) ×3 IMPLANT
GLOVE BIO SURGEON STRL SZ 6.5 (GLOVE) ×3 IMPLANT
GOWN STRL REUS W/ TWL LRG LVL3 (GOWN DISPOSABLE) ×6 IMPLANT
HEMOSTAT ARISTA ABSORB 3G PWDR (HEMOSTASIS) IMPLANT
INFLATOR BALLOON W/TUBE (BALLOONS) IMPLANT
IV NS 500ML BAXH (IV SOLUTION) ×3 IMPLANT
IV SET EXT 30 76VOL 4 MALE LL (IV SETS) ×3 IMPLANT
NDL HYPO 25X1 1.5 SAFETY (NEEDLE) ×3 IMPLANT
NDL PRECISIONGLIDE 27X1.5 (NEEDLE) ×6 IMPLANT
NDL SPNL 25GX3.5 QUINCKE BL (NEEDLE) ×3 IMPLANT
NEEDLE HYPO 25X1 1.5 SAFETY (NEEDLE) ×2 IMPLANT
NEEDLE PRECISIONGLIDE 27X1.5 (NEEDLE) ×2 IMPLANT
NEEDLE SPNL 25GX3.5 QUINCKE BL (NEEDLE) ×2 IMPLANT
NS IRRIG 1000ML POUR BTL (IV SOLUTION) ×3 IMPLANT
PACK BASIN DAY SURGERY FS (CUSTOM PROCEDURE TRAY) ×3 IMPLANT
PACK ENT DAY SURGERY (CUSTOM PROCEDURE TRAY) ×3 IMPLANT
PATTIES SURGICAL .5 X3 (DISPOSABLE) ×3 IMPLANT
SHEET MEDIUM DRAPE 40X70 STRL (DRAPES) IMPLANT
SLEEVE SCD COMPRESS KNEE MED (STOCKING) ×3 IMPLANT
SOLUTION ANTFG W/FOAM PAD STRL (MISCELLANEOUS) IMPLANT
SPLINT NASAL AIRWAY SILICONE (MISCELLANEOUS) IMPLANT
SPLINT NASAL POSISEP X .6X2 (GAUZE/BANDAGES/DRESSINGS) IMPLANT
SPONGE NEURO XRAY DETECT 1X3 (DISPOSABLE) IMPLANT
SUT CHROMIC 4 0 RB 1X27 (SUTURE) IMPLANT
SUT PLAIN 4 0 ~~LOC~~ 1 (SUTURE) IMPLANT
SUT SILK 2 0 SH (SUTURE) IMPLANT
SWAB COLLECTION DEVICE MRSA (MISCELLANEOUS) IMPLANT
SWAB CULTURE ESWAB REG 1ML (MISCELLANEOUS) IMPLANT
SYR CONTROL 10ML LL (SYRINGE) IMPLANT
SYR TB 1ML LL NO SAFETY (SYRINGE) ×6 IMPLANT
TOWEL GREEN STERILE FF (TOWEL DISPOSABLE) ×3 IMPLANT
TRACKER ENT INSTRUMENT (MISCELLANEOUS) ×3 IMPLANT
TRACKER ENT PATIENT (MISCELLANEOUS) ×3 IMPLANT
TUBE CONNECTING 20X1/4 (TUBING) ×3 IMPLANT
TUBE SALEM SUMP 12FR 48 (TUBING) IMPLANT
TUBE SALEM SUMP 16F (TUBING) ×3 IMPLANT
TUBING STRAIGHTSHOT EPS 5PK (TUBING) ×3 IMPLANT
YANKAUER SUCT BULB TIP NO VENT (SUCTIONS) ×3 IMPLANT

## 2023-10-28 NOTE — H&P (Signed)
 Alejandra George is an 33 y.o. female.    Chief Complaint:  Chronic sinusitis  HPI: Patient presents today for planned elective procedure.  She denies any interval change in history since office visit on 09/24/23.  Past Medical History:  Diagnosis Date   Anxiety    Asthma    Bipolar disorder (HCC)    Chronic constipation    Depression    Fibromyalgia    History of chicken pox    as Alejandra child   History of polydrug abuse (HCC)    Vaginal Pap smear, abnormal     Past Surgical History:  Procedure Laterality Date   BREAST ENHANCEMENT SURGERY     CESAREAN SECTION  07/24/2011   Procedure: CESAREAN SECTION;  Surgeon: Alejandra JONETTA Aho, MD;  Location: [facility];  Service: Gynecology;  Laterality: N/Alejandra;   LAPAROSCOPIC BILATERAL SALPINGECTOMY Bilateral 12/20/2022   Procedure: LAPAROSCOPIC BILATERAL SALPINGECTOMY;  Surgeon: Alejandra Browning, MD;  Location: [facility];  Service: Gynecology;  Laterality: Bilateral;  with filshie clips   TONSILLECTOMY      Family History  Problem Relation Age of Onset   Diabetes Mother    Hypertension Mother    Hypertension Maternal Grandmother    Diabetes Maternal Grandmother    Cancer Maternal Grandmother        Breast   Cancer Maternal Grandfather        Lung    Social History:  reports that she has never smoked. She has never used smokeless tobacco. She reports that she does not currently use drugs after having used the following drugs: Amphetamines, Heroin, and Crack cocaine. She reports that she does not drink alcohol.  Allergies:  Allergies  Allergen Reactions   Penicillins Swelling   Sulfa  Antibiotics Hives   Cephalosporins Rash    Medications Prior to Admission  Medication Sig Dispense Refill   beclomethasone (QVAR ) 40 MCG/ACT inhaler Inhale 1 puff into the lungs daily.     buPROPion  (WELLBUTRIN  XL) 150 MG 24 hr tablet Take 150 mg by mouth daily.     CLONIDINE  HCL PO Take 0.3 mg by mouth at bedtime.     lamoTRIgine (LAMICTAL  PO) Take 75 mg by mouth at bedtime.     lisdexamfetamine (VYVANSE ) 40 MG capsule Take 40 mg by mouth daily.     LORazepam (ATIVAN) 0.5 MG tablet Take 0.5 mg by mouth every 8 (eight) hours as needed for anxiety.     MAGNESIUM  GLYCINATE PO Take by mouth.     Amphetamine -Dextroamphetamine (ADDERALL XR PO) Take 30 mg by mouth daily.     Dextromethorphan-Bupropion  (AUVELITY PO) Take 20 mg by mouth daily.     DULoxetine  (CYMBALTA ) 20 MG capsule Take 20 mg by mouth daily.     Esketamine HCl (SPRAVATO, 84 MG DOSE, NA) Place into the nose. Twice Alejandra month     propranolol (INDERAL) 20 MG tablet Take 20 mg by mouth 3 (three) times daily as needed (anxiety).     sertraline  (ZOLOFT ) 25 MG tablet Take 25 mg by mouth at bedtime.     UNKNOWN TO PATIENT daily. Steroid inhaler - pt does not know name but will check and update DOS      Results for orders placed or performed during the hospital encounter of 10/28/23 (from the past 48 hours)  Pregnancy, urine POC     Status: None   Collection Time: 10/28/23 11:05 AM  Result Value Ref Range   Preg Test, Ur NEGATIVE NEGATIVE    Comment:  THE SENSITIVITY OF THIS METHODOLOGY IS >20 mIU/mL.    No results found.  ROS: ROS  Blood pressure 105/75, pulse 85, temperature 98.1 F (36.7 C), temperature source Oral, resp. rate 18, height 5' 6 (1.676 m), weight 68.9 kg, last menstrual period 10/07/2023, SpO2 100%.  PHYSICAL EXAM: Physical Exam Constitutional:      Appearance: Normal appearance.  Pulmonary:     Effort: Pulmonary effort is normal.  Neurological:     General: No focal deficit present.     Mental Status: She is alert.  Psychiatric:        Mood and Affect: Mood normal.     Studies Reviewed: CT sinus reviewed   Assessment/Plan Alejandra George is Alejandra 33 y.o. female with history of facial pain, nasal congestion and history of allergic rhinitis.  -To OR for right maxillary antrostomy, removal of right concha bullosa and bilateral inferior  turbinate reduction. Risks, recovery, post op restrictions reviewed. All questions answered.     Alejandra George Alejandra George 10/28/2023, 11:53 AM

## 2023-10-28 NOTE — Anesthesia Postprocedure Evaluation (Signed)
 Anesthesia Post Note  Patient: Kadance Mccuistion  Procedure(s) Performed: SINUS SURGERY, ENDOSCOPIC, USING COMPUTER-ASSISTED NAVIGATION (Right: Nose) REDUCTION, NASAL TURBINATE (Bilateral: Nose)     Patient location during evaluation: PACU Anesthesia Type: General Level of consciousness: sedated and patient cooperative Pain management: pain level controlled Vital Signs Assessment: post-procedure vital signs reviewed and stable Respiratory status: spontaneous breathing Cardiovascular status: stable Anesthetic complications: no   No notable events documented.  Last Vitals:  Vitals:   10/28/23 1345 10/28/23 1402  BP: 118/83 134/86  Pulse: (!) 116 (!) 101  Resp: 11 16  Temp:  36.6 C  SpO2: 97% 100%    Last Pain:  Vitals:   10/28/23 1402  TempSrc:   PainSc: 0-No pain                 Norleen Pope

## 2023-10-28 NOTE — Transfer of Care (Signed)
 Immediate Anesthesia Transfer of Care Note  Patient: Alejandra George  Procedure(s) Performed: Procedure(s) (LRB): SINUS SURGERY, ENDOSCOPIC, USING COMPUTER-ASSISTED NAVIGATION (Right) REDUCTION, NASAL TURBINATE (Bilateral)  Patient Location: PACU  Anesthesia Type: General  Level of Consciousness: awake, oriented, sedated and patient cooperative  Airway & Oxygen Therapy: Patient Spontanous Breathing and Patient connected to face mask oxygen  Post-op Assessment: Report given to PACU RN and Post -op Vital signs reviewed and stable  Post vital signs: Reviewed and stable  Complications: No apparent anesthesia complications  Last Vitals:  Vitals Value Taken Time  BP 128/92 10/28/23 13:23  Temp    Pulse 110 10/28/23 13:26  Resp 9 10/28/23 13:26  SpO2 100 % 10/28/23 13:26  Vitals shown include unfiled device data.  Last Pain:  Vitals:   10/28/23 1048  TempSrc: Oral  PainSc: 0-No pain      Patients Stated Pain Goal: 2 (10/28/23 1048)  Complications: No notable events documented.

## 2023-10-28 NOTE — Discharge Instructions (Addendum)
Alejandra George ENT SINUS SURGERY ([facility]ESS) Post Operative Instructions  Office: [phone]  The Surgery Itself Endoscopic sinus surgery (with or without septoplasty and turbinate reduction) involves general anesthesia, typically for one to two hours. Patients may be sedated for several hours after surgery and may remain sleepy for the better part of the day. Nausea and vomiting are occasionally seen, and usually resolve by the evening of surgery - even without additional medications. Almost all patients can go home the day of surgery.  After Surgery  Facial pressure and fullness similar to a sinus infection/headache is normal after surgery. Breathing through your nose is also difficult due to swelling. A humidifier or vaporizer can be used in the bedroom to prevent throat pain with mouth breathing.   Bloody nasal drainage is normal after this surgery for 5-7 days, usually decreasing in volume with each day that passes. Drainage will flow from the front of the nose and down the back of the throat. Make sure you spit out blood drainage that drips down the back of your throat to prevent nausea/vomiting. You will have a nasal drip pad/sling with gauze to catch drainage from the front of your nose. The dressing may need to be changed frequently during the first 24 hours following surgery. In case of profuse nasal bleeding, you may apply ice to the bridge of the nose and pinch the nose just above the tip and hold for 10 minutes; if bleeding continues, contact the doctors office.   Frequent hot showers or saline nasal rinses (NeilMed) will help break up congestion and clear any clot or mucus that builds up within the nose after surgery. This can be started the day after surgery.   It is more comfortable to sleep with extra pillows or in a recliner for the first few days after surgery until the drainage begins to resolve.    Do not blow your nose for 2 weeks after surgery.   Avoid lifting > 10 lbs. and no  vigorous exercise for 2 weeks after surgery.   Avoid airplane travel for 2 weeks following sinus surgery; the cabin pressure changes can cause pain and swelling within the nose/sinuses.   Sense of smell and taste are often diminished for several weeks after surgery. There may be some tenderness or numbness in your upper front teeth, which is normal after surgery. You may express old clot, discolored mucus or very large nasal crusts from your nose for up to 3-4 weeks after surgery; depending on how frequently and how effectively you irrigate your nose with the saltwater spray.   You may have absorbable sutures inside of your nose after surgery that will slowly dissolve in 2-3 weeks. Be careful when clearing crusts from the nose since they may be attached to these sutures.  Medications  Pain medication can be used for pain as prescribed. Pain and pressure in the nose is expected after surgery. As the surgical site heals, pain will resolve over the course of a week. Pain medications can cause nausea, which can be prevented if you take them with food or milk.   You may be given an antibiotic for one week after surgery to prevent infection. Take this medication with food to prevent nausea or vomiting.   You can use 2 nasal sprays after surgery: Afrin can be used up to 2 times a day for up to 5 days after surgery (best before bed) to reduce bloody drainage from the nose for the first few days after surgery. Saline/salt  water spray should be used at least 4-6 times per day, starting the day after surgery to prevent crusting inside of the nose.   Take all of your routine medications as prescribed, unless told otherwise by your surgeon. Any medications that thin the blood should be avoided. This includes aspirin. Avoid aspirin-like products for the first 72 hours after surgery (Advil, Motrin, Excedrin, Alieve, Celebrex, Naprosyn), but you may use them as needed for pain after 72 hours.   Post Anesthesia  Home Care Instructions  Activity: Get plenty of rest for the remainder of the day. A responsible individual must stay with you for 24 hours following the procedure.  For the next 24 hours, DO NOT: -Drive a car -Advertising copywriter -Drink alcoholic beverages -Take any medication unless instructed by your physician -Make any legal decisions or sign important papers.  Meals: Start with liquid foods such as gelatin or soup. Progress to regular foods as tolerated. Avoid greasy, spicy, heavy foods. If nausea and/or vomiting occur, drink only clear liquids until the nausea and/or vomiting subsides. Call your physician if vomiting continues.  Special Instructions/Symptoms: Your throat may feel dry or sore from the anesthesia or the breathing tube placed in your throat during surgery. If this causes discomfort, gargle with warm salt water. The discomfort should disappear within 24 hours.  If you had a scopolamine patch placed behind your ear for the management of post- operative nausea and/or vomiting:  1. The medication in the patch is effective for 72 hours, after which it should be removed.  Wrap patch in a tissue and discard in the trash. Wash hands thoroughly with soap and water. 2. You may remove the patch earlier than 72 hours if you experience unpleasant side effects which may include dry mouth, dizziness or visual disturbances. 3. Avoid touching the patch. Wash your hands with soap and water after contact with the patch.

## 2023-10-28 NOTE — Op Note (Signed)
 OPERATIVE NOTE  Alejandra George Date/Time of Admission: 10/28/2023 10:31 AM  CSN: [id number];FMW:[id number] Attending Provider: Llewellyn Sayres A, DO Room/Bed: [facility]SP/NONE DOB: [birth year] Age: 33 y.o.   Pre-Op Diagnosis: Chronic maxillary sinusitis Facial pain Allergic rhinitis, unspecified seasonality, unspecified trigger  Post-Op Diagnosis: Chronic maxillary sinusitisFacial painAllergic rhinitis, unspecified seasonality, unspecified trigger  Procedure: Procedure(s): FUNCTIONAL ENDOSCOPIC SINUS SURGERY WITH RIGHT MAXILLARY ANTROSTOMY WITH TISSUE REMOVAL RESECTION OF RIGHT CONCHA BULLOSA BILATERAL INFERIOR TURBINATE REDUCTION  Anesthesia: General  Surgeon(s): Calum Cormier A Godwin Tedesco, DO  Staff: Circulator: Johnson-Hines, Arland HERO, RN Scrub Person: Ethan Render DASEN, RN  Implants: * No implants in log *  Specimens: ID Type Source Tests Collected by Time Destination  1 : Right Sinus Contents Tissue PATH Other SURGICAL PATHOLOGY [name] A, DO 10/28/2023 1239     Complications: None  EBL: 5 ML  Condition: stable  Operative Findings:  Mild left septal deviation Very large right concha bullosa No mucopurulent drainage Bilateral inferior turbinate hypertrophy with allergic mucosa  Description of Operation: Once operative consent was obtained and the site and surgery were confirmed with the patient and the operating room team, the patient was brought back to the operating room and general endotracheal anesthesia was obtained. The patient was turned over to the ENT service, at which time the image-guided system was attached and noted to be in good calibration. Lidocaine  1% with 1:100,000 epinephrine  was injected into the inferior turbinates bilaterally, the right middle turbinate, and the axilla between the medial turbinate and the lateral nasal wall. Afrin-soaked pledgets were placed into the nasal cavity, and the patient was prepped and draped in sterile fashion.  Attention turned to the right-sided sinonasal cavity. A sickle knife was used to enter into the concha bullosa, and an endoscopic scissor and true cut forceps was used to excise the medial wall completely. The middle turbinate was medialized and a ball-tipped seeker was used to slightly anterior fracture the uncinate process. The uncinate process was then completely fractured anteriorly and then removed with a combination of backbiter and a microdebrider. A ball-tipped seeker was used to identify the natural os of the maxillary sinus, and it was widened with combination of the backbiter, the microdebrider, the olive tip suction, and the straight TruCut until it was widely patent. The sinus was copiously irrigated. Attention was then turned to the inferior turbinates bilaterally.They were outfractured and then submucous resection was performed by making an incision in the leading edge with a 15 blade, separating the mucosa from bone with a Cottle elevator and then using the micro debrider via a turbinate blade to remove bone. Pledgets were removed, copious irrigation was placed in the sinonasal cavities and Posisep absorbable nasal pack was placed lateral to the right middle turbinate in the axilla between it and the lateral nasal wall.  An orogastric tube was placed and the stomach cavity was suctioned to reduce postoperative nausea. The patient was turned over to anesthesia service and was extubated in the operating room and transferred to the PACU in stable condition. The patient will be discharged today and followed up in the ENT clinic in 1 week for postoperative check.   Sayres DELENA Llewellyn, DO [facility]  10/28/2023

## 2023-10-28 NOTE — Anesthesia Procedure Notes (Signed)
 Procedure Name: Intubation Date/Time: 10/28/2023 12:15 PM  Performed by: Delayne Olam BIRCH, CRNAPre-anesthesia Checklist: Patient identified, Emergency Drugs available, Suction available and Patient being monitored Patient Re-evaluated:Patient Re-evaluated prior to induction Oxygen Delivery Method: Circle system utilized Preoxygenation: Pre-oxygenation with 100% oxygen Induction Type: IV induction Ventilation: Mask ventilation without difficulty Laryngoscope Size: Mac and 3 Grade View: Grade I Tube type: Oral Tube size: 7.0 mm Number of attempts: 1 Airway Equipment and Method: Stylet and Oral airway Placement Confirmation: ETT inserted through vocal cords under direct vision, positive ETCO2 and breath sounds checked- equal and bilateral Secured at: 21 cm Tube secured with: Tape Dental Injury: Teeth and Oropharynx as per pre-operative assessment

## 2023-10-29 ENCOUNTER — Encounter (HOSPITAL_BASED_OUTPATIENT_CLINIC_OR_DEPARTMENT_OTHER): Payer: Self-pay | Admitting: Otolaryngology

## 2023-10-30 LAB — SURGICAL PATHOLOGY

## 2023-11-03 DIAGNOSIS — E611 Iron deficiency: Secondary | ICD-10-CM | POA: Diagnosis not present

## 2023-11-03 DIAGNOSIS — Z87898 Personal history of other specified conditions: Secondary | ICD-10-CM | POA: Diagnosis not present

## 2023-11-05 DIAGNOSIS — J309 Allergic rhinitis, unspecified: Secondary | ICD-10-CM | POA: Diagnosis not present

## 2023-11-05 DIAGNOSIS — R0981 Nasal congestion: Secondary | ICD-10-CM | POA: Diagnosis not present

## 2023-11-05 DIAGNOSIS — J320 Chronic maxillary sinusitis: Secondary | ICD-10-CM | POA: Diagnosis not present

## 2023-11-06 DIAGNOSIS — F4312 Post-traumatic stress disorder, chronic: Secondary | ICD-10-CM | POA: Diagnosis not present

## 2023-11-10 DIAGNOSIS — F332 Major depressive disorder, recurrent severe without psychotic features: Secondary | ICD-10-CM | POA: Diagnosis not present

## 2023-11-12 DIAGNOSIS — F332 Major depressive disorder, recurrent severe without psychotic features: Secondary | ICD-10-CM | POA: Diagnosis not present

## 2023-11-12 DIAGNOSIS — F4312 Post-traumatic stress disorder, chronic: Secondary | ICD-10-CM | POA: Diagnosis not present

## 2023-11-19 DIAGNOSIS — F4312 Post-traumatic stress disorder, chronic: Secondary | ICD-10-CM | POA: Diagnosis not present

## 2023-11-26 DIAGNOSIS — F332 Major depressive disorder, recurrent severe without psychotic features: Secondary | ICD-10-CM | POA: Diagnosis not present

## 2023-11-28 DIAGNOSIS — E611 Iron deficiency: Secondary | ICD-10-CM | POA: Diagnosis not present

## 2023-11-28 DIAGNOSIS — Z87898 Personal history of other specified conditions: Secondary | ICD-10-CM | POA: Diagnosis not present

## 2023-12-03 DIAGNOSIS — F4312 Post-traumatic stress disorder, chronic: Secondary | ICD-10-CM | POA: Diagnosis not present

## 2023-12-17 DIAGNOSIS — F4312 Post-traumatic stress disorder, chronic: Secondary | ICD-10-CM | POA: Diagnosis not present

## 2023-12-24 DIAGNOSIS — F4312 Post-traumatic stress disorder, chronic: Secondary | ICD-10-CM | POA: Diagnosis not present

## 2023-12-26 DIAGNOSIS — J320 Chronic maxillary sinusitis: Secondary | ICD-10-CM | POA: Diagnosis not present

## 2023-12-26 DIAGNOSIS — J309 Allergic rhinitis, unspecified: Secondary | ICD-10-CM | POA: Diagnosis not present

## 2023-12-27 DIAGNOSIS — F332 Major depressive disorder, recurrent severe without psychotic features: Secondary | ICD-10-CM | POA: Diagnosis not present

## 2023-12-30 DIAGNOSIS — F3341 Major depressive disorder, recurrent, in partial remission: Secondary | ICD-10-CM | POA: Diagnosis not present

## 2023-12-30 DIAGNOSIS — F411 Generalized anxiety disorder: Secondary | ICD-10-CM | POA: Diagnosis not present

## 2023-12-30 DIAGNOSIS — F5101 Primary insomnia: Secondary | ICD-10-CM | POA: Diagnosis not present

## 2023-12-30 DIAGNOSIS — F900 Attention-deficit hyperactivity disorder, predominantly inattentive type: Secondary | ICD-10-CM | POA: Diagnosis not present

## 2023-12-31 DIAGNOSIS — F4312 Post-traumatic stress disorder, chronic: Secondary | ICD-10-CM | POA: Diagnosis not present

## 2024-01-05 DIAGNOSIS — F4312 Post-traumatic stress disorder, chronic: Secondary | ICD-10-CM | POA: Diagnosis not present
